# Patient Record
Sex: Female | Born: 1955 | Race: Black or African American | Hispanic: No | Smoking: Never smoker
Health system: Southern US, Community
[De-identification: ages and names within clinical notes are randomized; demographics above are authoritative.]

## PROBLEM LIST (undated history)

## (undated) DIAGNOSIS — E669 Obesity, unspecified: Secondary | ICD-10-CM

## (undated) DIAGNOSIS — M359 Systemic involvement of connective tissue, unspecified: Secondary | ICD-10-CM

## (undated) DIAGNOSIS — I2699 Other pulmonary embolism without acute cor pulmonale: Secondary | ICD-10-CM

## (undated) DIAGNOSIS — J0100 Acute maxillary sinusitis, unspecified: Secondary | ICD-10-CM

## (undated) DIAGNOSIS — J209 Acute bronchitis, unspecified: Secondary | ICD-10-CM

## (undated) DIAGNOSIS — E8881 Metabolic syndrome: Secondary | ICD-10-CM

## (undated) DIAGNOSIS — K559 Vascular disorder of intestine, unspecified: Secondary | ICD-10-CM

## (undated) DIAGNOSIS — Z8719 Personal history of other diseases of the digestive system: Secondary | ICD-10-CM

## (undated) DIAGNOSIS — J329 Chronic sinusitis, unspecified: Secondary | ICD-10-CM

## (undated) DIAGNOSIS — H6693 Otitis media, unspecified, bilateral: Secondary | ICD-10-CM

## (undated) DIAGNOSIS — I10 Essential (primary) hypertension: Secondary | ICD-10-CM

## (undated) DIAGNOSIS — Z8679 Personal history of other diseases of the circulatory system: Secondary | ICD-10-CM

## (undated) DIAGNOSIS — J45909 Unspecified asthma, uncomplicated: Secondary | ICD-10-CM

## (undated) DIAGNOSIS — E119 Type 2 diabetes mellitus without complications: Secondary | ICD-10-CM

## (undated) DIAGNOSIS — D219 Benign neoplasm of connective and other soft tissue, unspecified: Secondary | ICD-10-CM

## (undated) HISTORY — DX: Type 2 diabetes mellitus without complications: E11.9

## (undated) HISTORY — DX: Unspecified asthma, uncomplicated: J45.909

## (undated) HISTORY — PX: COLONOSCOPY W/ BIOPSIES: SHX1374

## (undated) HISTORY — DX: Acute maxillary sinusitis, unspecified: J01.00

## (undated) HISTORY — DX: Acute bronchitis, unspecified: J20.9

## (undated) HISTORY — DX: Essential (primary) hypertension: I10

## (undated) HISTORY — DX: Other pulmonary embolism without acute cor pulmonale: I26.99

## (undated) HISTORY — DX: Obesity, unspecified: E66.9

---
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
## 1997-11-19 ENCOUNTER — Ambulatory Visit (HOSPITAL_COMMUNITY): Admission: RE | Admit: 1997-11-19 | Discharge: 1997-11-19 | Payer: Self-pay | Admitting: Internal Medicine

## 1998-01-04 ENCOUNTER — Other Ambulatory Visit: Admission: RE | Admit: 1998-01-04 | Discharge: 1998-01-04 | Payer: Self-pay | Admitting: *Deleted

## 1998-01-28 ENCOUNTER — Other Ambulatory Visit: Admission: RE | Admit: 1998-01-28 | Discharge: 1998-01-28 | Payer: Self-pay | Admitting: *Deleted

## 1998-06-07 ENCOUNTER — Ambulatory Visit (HOSPITAL_COMMUNITY): Admission: RE | Admit: 1998-06-07 | Discharge: 1998-06-07 | Payer: Self-pay | Admitting: Internal Medicine

## 1998-08-13 HISTORY — PX: ABDOMINAL HYSTERECTOMY: SHX81

## 1998-10-06 ENCOUNTER — Inpatient Hospital Stay (HOSPITAL_COMMUNITY): Admission: RE | Admit: 1998-10-06 | Discharge: 1998-10-08 | Payer: Self-pay | Admitting: *Deleted

## 1998-12-30 ENCOUNTER — Other Ambulatory Visit: Admission: RE | Admit: 1998-12-30 | Discharge: 1998-12-30 | Payer: Self-pay | Admitting: *Deleted

## 1999-04-18 ENCOUNTER — Encounter: Payer: Self-pay | Admitting: Internal Medicine

## 1999-04-18 ENCOUNTER — Ambulatory Visit (HOSPITAL_COMMUNITY): Admission: RE | Admit: 1999-04-18 | Discharge: 1999-04-18 | Payer: Self-pay | Admitting: Internal Medicine

## 2000-08-10 ENCOUNTER — Encounter: Payer: Self-pay | Admitting: Internal Medicine

## 2000-08-10 ENCOUNTER — Ambulatory Visit (HOSPITAL_COMMUNITY): Admission: RE | Admit: 2000-08-10 | Discharge: 2000-08-10 | Payer: Self-pay | Admitting: Internal Medicine

## 2001-02-25 ENCOUNTER — Encounter: Payer: Self-pay | Admitting: Internal Medicine

## 2001-02-25 ENCOUNTER — Observation Stay (HOSPITAL_COMMUNITY): Admission: AD | Admit: 2001-02-25 | Discharge: 2001-02-27 | Payer: Self-pay | Admitting: Internal Medicine

## 2001-02-26 ENCOUNTER — Encounter: Payer: Self-pay | Admitting: Neurology

## 2001-09-22 ENCOUNTER — Inpatient Hospital Stay (HOSPITAL_COMMUNITY): Admission: AD | Admit: 2001-09-22 | Discharge: 2001-09-25 | Payer: Self-pay | Admitting: Internal Medicine

## 2001-09-23 ENCOUNTER — Encounter: Payer: Self-pay | Admitting: Internal Medicine

## 2002-05-15 ENCOUNTER — Encounter: Admission: RE | Admit: 2002-05-15 | Discharge: 2002-05-15 | Payer: Self-pay | Admitting: Internal Medicine

## 2002-05-15 ENCOUNTER — Encounter: Payer: Self-pay | Admitting: Internal Medicine

## 2002-12-18 ENCOUNTER — Encounter: Payer: Self-pay | Admitting: Cardiovascular Disease

## 2002-12-18 ENCOUNTER — Encounter: Admission: RE | Admit: 2002-12-18 | Discharge: 2002-12-18 | Payer: Self-pay | Admitting: Cardiovascular Disease

## 2003-05-26 ENCOUNTER — Other Ambulatory Visit: Admission: RE | Admit: 2003-05-26 | Discharge: 2003-05-26 | Payer: Self-pay | Admitting: *Deleted

## 2005-03-15 ENCOUNTER — Inpatient Hospital Stay (HOSPITAL_COMMUNITY): Admission: RE | Admit: 2005-03-15 | Discharge: 2005-03-19 | Payer: Self-pay | Admitting: Internal Medicine

## 2005-03-29 ENCOUNTER — Ambulatory Visit (HOSPITAL_COMMUNITY): Admission: RE | Admit: 2005-03-29 | Discharge: 2005-03-29 | Payer: Self-pay | Admitting: Gastroenterology

## 2005-09-25 HISTORY — PX: CARDIAC CATHETERIZATION: SHX172

## 2006-03-27 ENCOUNTER — Encounter: Admission: RE | Admit: 2006-03-27 | Discharge: 2006-03-27 | Payer: Self-pay | Admitting: Internal Medicine

## 2006-12-16 ENCOUNTER — Encounter: Admission: RE | Admit: 2006-12-16 | Discharge: 2006-12-16 | Payer: Self-pay | Admitting: Internal Medicine

## 2007-01-08 ENCOUNTER — Encounter: Admission: RE | Admit: 2007-01-08 | Discharge: 2007-01-08 | Payer: Self-pay | Admitting: Internal Medicine

## 2007-02-04 ENCOUNTER — Encounter: Admission: RE | Admit: 2007-02-04 | Discharge: 2007-02-04 | Payer: Self-pay | Admitting: Internal Medicine

## 2008-04-04 ENCOUNTER — Emergency Department (HOSPITAL_COMMUNITY): Admission: EM | Admit: 2008-04-04 | Discharge: 2008-04-04 | Payer: Self-pay | Admitting: Emergency Medicine

## 2008-04-09 ENCOUNTER — Ambulatory Visit (HOSPITAL_COMMUNITY): Admission: RE | Admit: 2008-04-09 | Discharge: 2008-04-09 | Payer: Self-pay | Admitting: Cardiology

## 2008-05-31 ENCOUNTER — Inpatient Hospital Stay (HOSPITAL_COMMUNITY): Admission: EM | Admit: 2008-05-31 | Discharge: 2008-06-04 | Payer: Self-pay | Admitting: Emergency Medicine

## 2008-06-01 ENCOUNTER — Encounter (INDEPENDENT_AMBULATORY_CARE_PROVIDER_SITE_OTHER): Payer: Self-pay | Admitting: Gastroenterology

## 2009-10-28 ENCOUNTER — Encounter: Admission: RE | Admit: 2009-10-28 | Discharge: 2009-10-28 | Payer: Self-pay | Admitting: Internal Medicine

## 2010-09-19 ENCOUNTER — Other Ambulatory Visit: Payer: Self-pay | Admitting: Internal Medicine

## 2010-09-19 DIAGNOSIS — R51 Headache: Secondary | ICD-10-CM

## 2010-09-19 DIAGNOSIS — R4789 Other speech disturbances: Secondary | ICD-10-CM

## 2010-09-19 DIAGNOSIS — R42 Dizziness and giddiness: Secondary | ICD-10-CM

## 2010-09-25 ENCOUNTER — Ambulatory Visit
Admission: RE | Admit: 2010-09-25 | Discharge: 2010-09-25 | Disposition: A | Discharge status: Home / Self Care | Payer: Self-pay | Source: Ambulatory Visit | Attending: Internal Medicine | Admitting: Internal Medicine

## 2010-09-25 DIAGNOSIS — R42 Dizziness and giddiness: Secondary | ICD-10-CM

## 2010-09-25 DIAGNOSIS — R51 Headache: Secondary | ICD-10-CM

## 2010-09-25 DIAGNOSIS — R4789 Other speech disturbances: Secondary | ICD-10-CM

## 2010-11-29 ENCOUNTER — Encounter: Payer: Self-pay | Admitting: Internal Medicine

## 2010-12-26 NOTE — Consult Note (Signed)
[name], [name]                ACCOUNT NO.:  [id number]   MEDICAL RECORD NO.:  [id number]          PATIENT TYPE:  INP   LOCATION:  6533                         FACILITY:  [facility]   PHYSICIAN:  Jordan Hawks. Elnoria Howard, MD    DATE OF BIRTH:  [birth year]   DATE OF CONSULTATION:  05/31/2008  DATE OF DISCHARGE:                                 CONSULTATION   REASON FOR CONSULTATION:  Abnormal CT scan and hematochezia.   REFERRING PHYSICIAN:  Eric L. August Saucer, MD   HISTORY OF PRESENT ILLNESS:  This is a 55 year old female with a past  medical history of pyelonephritis, hypertension, new onset diabetes, and  hysterectomy is admitted to the hospital with complaints of acute  abdominal pain and the patient started to have pain at 2 a.m. in the  morning and subsequently presented to the emergency room.  With the  pain, she had several bouts of diarrhea and then subsequently had  multiple bouts of hematochezia.  She denies having any issues with  abdominal pain or hematochezia in the past and because of her symptoms,  she was admitted to the hospital.  The pain was also associated with  nausea and vomiting.  A CT scan was obtained and it was revealing for a  thickening in the distal terminal ileum.  However, there was no  surrounding inflammation and the patient denies any recent sick  contacts, no fever, and no history of diarrhea before this time.  Previously, she was in her usual state of health before the acute onset  of her abdominal pain and hematochezia at 2 a.m.   PAST MEDICAL AND SURGICAL HISTORY:  As stated above.   FAMILY HISTORY:  Noncontributory.   SOCIAL HISTORY:  Positive for social alcohol, negative for alcohol or  illicit drug use.   ALLERGIES:  No known drug allergies.   MEDICATIONS:  Maxzide, metoprolol, and metformin.   REVIEW OF SYSTEMS:  As stated above in the history present of illness,  otherwise, negative.   PHYSICAL EXAMINATION:  VITAL SIGNS:  Stable.  GENERAL:  The  patient is in no acute distress, alert, and oriented.  HEENT:  Normocephalic and atraumatic.  Extraocular muscles are intact.  NECK:  Supple.  No lymphadenopathy.  LUNGS:  Clear to auscultation bilaterally.  CARDIOVASCULAR:  Regular rate and rhythm.  ABDOMEN:  Mildly obese, soft, and nontender in the midabdomen.  No  rebound or rigidity.  Positive bowel sounds.  EXTREMITIES:  No clubbing, cyanosis, or edema.  RECTAL:  Heme-positive.   LABORATORY VALUES:  White blood cell count 16.2, hemoglobin 15.3, MCV is  93.2, and platelets 408.  Sodium 138, potassium 2.8, chloride 102, BUN  is 14, creatinine 1.1, and glucose 157.   IMPRESSION:  1. Abnormal CT scan consistent with possible colitis.  2. Hematochezia.  3. Heme-positive stool.  After evaluation of the patient, she      certainly has a tender abdomen and further evaluation with an      endoscopic examination is required.  I believe a colonoscopy will      be in the best interest;  however, she is not able to tolerate any      types of p.o. currently, she had difficult time tolerating the oral      contrast for the CAT scan.   PLAN:  Plan at this time is to have the patient undergo a flexible  sigmoidoscopy with Fleet enema preparation and further recommendations  pending the findings.      Jordan Hawks Elnoria Howard, MD  Electronically Signed     PDH/MEDQ  D:  05/31/2008  T:  06/01/2008  Job:  [id number]   cc:   Minerva Areola L. August Saucer, M.D.

## 2010-12-26 NOTE — H&P (Signed)
NAMEMELONEY, [name]                ACCOUNT NO.:  [id number]   MEDICAL RECORD NO.:  [id number]          PATIENT TYPE:  INP   LOCATION:  6533                         FACILITY:  [facility]   PHYSICIAN:  Eric L. August Saucer, M.D.     DATE OF BIRTH:  1956/05/11   DATE OF ADMISSION:  05/31/2008  DATE OF DISCHARGE:                              HISTORY & PHYSICAL   CHIEF COMPLAINT:  New onset of left lower quadrant pain with lower GI  bleed.   HISTORY OF PRESENT ILLNESS:  This is a second [facility]  admission for this 55 year old widowed black female with a past history  of hypertension and nonspecific connective tissue disease.  The patient  states she had been doing well until yesterday evening.  She began  experiencing lower abdominal pain.  During the night, the pain became  much more severe in the left lower quadrant.  This was associated with  expulsive nausea, vomiting, and diarrhea at the same time.  She noted  bright red blood in her stools with her bouts of diarrhea.  There were  no clots noted.  She had several bouts of emesis without frank blood  being seen as well.  States she went approximately every 10 minutes for  2 hours.  She was significantly fatigued in the morning time and called  office at that time for the evaluation.  In view of the rectal bleeding,  she was advised to come to the emergency room for further evaluation.  Note that the history is negative for any change in eating habits.  The  patient does not drink.  She had been using aspirin at 81 mg daily for  the past month.  She had also taken one Aleve 1 day prior to this event  for right shoulder pain.   PAST MEDICAL HISTORY:  Otherwise, significant for having presented to  the emergency room in August of this year with left-sided chest pain.  There was no abdominal pain at that time.  She subsequently underwent  further evaluation with Dr. Sharyn Lull for Cardiology.  No significant  coronary artery disease was  found.  She has, however, been maintained on  low-dose aspirin.   Her history is significant for problems with irritable bowel syndrome in  the past.  She has had a recurrent bouts of sinusitis and allergic  rhinitis.  Atypical headaches in the past.  She has had colonic  dysfunction with constipation.  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   History is significant as noted for hypertension as well.   PAST SURGICAL HISTORY:  Notable for hysterectomy.   FAMILY HISTORY:  Positive for hypertension and diabetes.   SOCIAL HISTORY:  The patient is widowed, has one teenage daughter, alive  and well.   MOST RECENT MEDICATIONS:  Consist of:  1. Metformin 500 mg p.o. daily.  2. Metoprolol 50 mg b.i.d.  3. Maxzide 25 mg daily.  4. Cyclobenzaprine 5 mg t.i.d., muscle spasms.  5. Biotin daily.  6. Gabapentin 300  mg t.i.d.  7. Calcium plus D 2 tablets daily.   PHYSICAL EXAMINATION:  GENERAL:  She is an ill-appearing black female  presently in mild distress.  VITAL SIGNS:  Blood pressure 130/90, pulse 88, and respiratory rate 18.  Afebrile.  HEENT:  Head is normocephalic and atraumatic without bruits.  Extraocular muscles intact.  Fundi grade 0.  There is no sinus  tenderness.  TMs with cerumen bilaterally.  Nose:  Mild turbinate edema  without occlusions.  Fairly good dental repair.  Posterior oropharynx  clear.  NECK:  Supple.  No enlarged thyroid.  Small posterior cervical nodes on  the left versus right.  LUNGS:  Clear to auscultation.  No wheezes or rales.  No E to A changes  appreciated.  Decreased breath sounds at the bases.  CARDIOVASCULAR:  Normal S1 and S2.  No S3, S4, murmurs, or rubs.  ABDOMEN:  Bowel sounds present and active.  He does have left lower  quadrant tenderness with early rebound noted.  No right upper quadrant  tenderness, left or right lower quadrant tenderness.  MUSCULOSKELETAL:   Full passive range of motion of extremities.  No  cyanosis or clubbing.  No crepitations or edema appreciated.  Negative  Homans.  NEUROLOGIC:  Grossly intact at this time.  SKIN:  Without active lesions.   LABORATORY DATA:  CBC revealed WBC of 16,200, hemoglobin 15.3,  hematocrit of 45.0, RDW of 13.2, platelet counts of 40,000, and 93%  neutrophils, which represents a left shift.  Glucose of 157, sodium 138,  potassium 3.8, chloride 102, BUN 14, and creatinine 1.1.  Laboratory  data pending.  CT scan of the abdomen shows the lung base with dependent atelectasis  bilaterally.  Heart size is normal.  Liver, gallbladder, and adrenal  glands were unremarkable.  CT scan of the pelvis demonstrated  questionable thickening of the distal transverse colon, which may be  infectious or inflammatory in etiology.  There was no pathologically  enlarged lymph nodes.  No lytic lesions seen as well.   IMPRESSION:  1. New onset left lower quadrant abdominal pain.  Rule out ischemic      colitis.  Rule out diverticulitis.  Rule out other.  2. Lower gastrointestinal bleed secondary to new onset of left lower      quadrant abdominal pain.  3. 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  4. Hypertension.  5. Insulin-resistance/early diabetes mellitus.  6. History of atypical chest pain, on aspirin therapy.  7. History of recurrent musculoskeletal pain, nonspecific.   PLAN:  The patient was admitted at this time to [facility] for close  observation and further evaluation.  She will need serial hemoglobins  and hematocrits.  We will control her pain acutely with low-dose  Dilaudid as needed.  We will place on Cipro IV empirically as well.  GI  consultation has been obtained with Dr. Elnoria Howard.  She will need more direct  evaluation over the next 24 hours.  Further therapy pending results of  the above.           ______________________________  Lind Guest. August Saucer, M.D.     ELD/MEDQ  D:   05/31/2008  T:  06/01/2008  Job:  [id number]

## 2010-12-29 NOTE — Consult Note (Signed)
[place]. [facility]  Patient:    Claudia, Anderson                        MRN: [id number] Proc. Date: 02/26/01 Adm. Date:  [id number] Attending:  Gwenyth Bender CC:         Lind Guest. August Saucer, M.D.  [facility], [address]   Consultation Report  BRIEF HISTORY:  This is a 55 year old black female, right-handed, born 08-Feb-1956, with a history of hypertension who comes to [facility] for evaluation of left arm numbness and pain.  The patient claims that the problem began suddenly overnight in early June of 2002.  The patient had noted some numbness and minimal pain below the knee on the left leg beginning about 1 year ago that has been persistent.  The patient believed that the left arm has also been somewhat weak, and denies any problems with bowel or bladder control and denies any balance problems.  The patient does note some pain in the base of the neck but this is not severe.  The patient denies any numbness or weakness on the face or head, denies any visual field changes, double vision, or loss of vision.  The patient has been brought in for further evaluation and has had blood work done with borderline studies for lupus and mixed connective tissue disease.  MRI scan done with gadolinium only is unremarkable by my review.  MRI is to be repeated without gadolinium.  Neurology was asked to see this patient for further evaluation.  PAST MEDICAL HISTORY:  Significant for: 1. History of hysterectomy for fibroids. 2. Hypertension. 3. History of asthma. 4. Left arm, left leg numbness with pain in the left arm.  MEDICATION:  The patient prior to admission was on: 1. Toprol XL 50 mg daily. 2. Maxzide 25 mg 3. Singulair 10 mg a day. 4. Advair inhaler. 5. Allegra 180 mg daily. 6. Cyclobenzaprine 10 mg twice a day. 7. Celebrex 200 mg twice a day.  ALLERGIES:  The patient has no known allergies.  HABITS:  Does not  smoke or drink.  SOCIAL HISTORY:  The patient is an Neurosurgeon. The patient lives in [place], and has a daughter age 42.  FAMILY HISTORY:  Notable that mother, age 70, with hypertension and diabetes. Father age 54 with hypertension.  There is a history of stroke in several aunts and uncles.  REVIEW OF SYSTEMS:  Notable for no recent fevers, chills.  The patient denies headache or visual field changes.   Neck:  Neck stiffness.  Does note some pain in the base of the neck.  Does note some left arm pain with tingly sensations that go up the entirety of the arm to the shoulder.  Does note some numbness below the knee on the left leg.  Denies any abdominal pain, shortness of breath, chest pains, blackout episodes, dizziness.  PHYSICAL EXAMINATION:  VITAL SIGNS:  Blood pressure is 110/70, heart rate is 108, respiratory rate 20, temperature 99.7 on admission.  GENERAL:  This patient is a minimally obese black female who is alert and cooperative at the time of examination.  HEENT:  Head is atraumatic.  Eyes:  Pupils, equal, round, reactive to light. Disks are flat bilaterally.  NECK:  Supple.  No carotid bruits.  RESPIRATORY EXAMINATION:  Clear to auscultation and percussion.  CARDIOVASCULAR:  Reveals regular rate and rhythm no obvious murmurs, rubs  noted.  EXTREMITIES:  Without significant edema.  NEUROLOGIC:  Cranial nerve as above.  Facial symmetry was present.  The patient has good sensation to the face to pinprick and soft touch bilaterally. The patient had good strength to facial muscles and the muscles of head turning and shoulder shrug bilaterally.  The muscles of mastication are symmetric and normal in strength.  The patient has good tongue strength bilaterally.  Speech is well enunciated and not aphasic.  Motor testing was 5/5 strength in all fours.  Good symmetric motor tone is noted throughout. Sensory testing is notable for some  decreased pinprick sensation on the entirety of the left arm, base of the left neck, left leg above and below the knee, vibratory sensation is minimally depressed on the left arm as compared to the right and symmetric in the lower extremities.  The patient has good position sense in all fours.  No evidence of ______ is noted.  The patient has good finger-nose-finger, heel to chin bilaterally.  Gait is normal, tandem gait normal, Romberg negative, no evidence of pronator drift is seen.  Deep tendon reflexes are symmetric and normal with toes downgoing.  LABORATORY DATA:  Notable for a CPK of 131, sodium 138, potassium 4.2, chloride of 104, CO2 20, glucose 94, BUN 14, creatinine 0.9, calcium 9.9, total protein 7.8, albumin 4.1, AST of 24, ALT 18, alk. phos. 56, total bilirubin 0.7, white count 7.1, hemoglobin 14.3, hematocrit 41.7, MCV 92.4, platelets 348, ANA is pending.  C-reactive protein pending.  MRI scan of the head is pending at this time.  Initial study with gadolinium is unremarkable by my reading.  IMPRESSION:  Central alteration of pain left arm, chronic left lower extremity numbness.  This patient has a relatively unremarkable examination.  The patient notes with neck extension that she does have some increased tingly sensations down the left arm.  Certainly demyelinating disease such as multiple sclerosis does need to be considered but also need to rule out a cervical disk or bone spur compressing the right side of the spinal cord, may offer some left-sided sensory complaints.  Will proceed with further work-up at this point.  PLAN: 1. MRI scan of the cervical spine. 2. Lumbar puncture if MRIs are unremarkable. 3. ACE level for sarcoidosis if this has not already been done. 4. Will follow patient clinically while in house. DD:  02/26/01 TD:  02/26/01 Job: [id number]

## 2010-12-29 NOTE — Discharge Summary (Signed)
[place]. [facility]  Patient:    [name], [name] Visit Number: [id number] MRN: [id number]          Service Type: Attending:  Lind Guest. August Saucer, M.D. Dictated by:   Lind Guest. August Saucer, M.D. Adm. Date:  09/22/01 Disc. Date: 09/25/01                             Discharge Summary  FINAL DIAGNOSES: 1. Chest pain, noncardiac; 786.59. 2. Hypertension, 401.9. 3. Stress reaction 308.0. 4. Allergic rhinitis, 477.9. 5. Chronic sinusitis, 473.9. 6. Otitis media, 382.9. 7. 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 8. Bronchitis.  OPERATION/PROCEDURE:  Cardiac catheterization per Ricki Rodriguez, M.D.  HISTORY OF PRESENT ILLNESS:  This was the second [place] admission for this 55 year old divorced black female who presented to the office complaining of increasing chest pain of one weeks duration.  She had described the pain as being dull, substernal pressure sensation which has increased with activity.  There was no associated palpitations, shortness of breath or diaphoresis.  No associated nausea as well.  The patient tried antacids without significant improvement.  She noted that the pain would not awaken her from her sleep.  She notably had been under increased stress over the past several months.  The day of admission her pains had become increasingly worse, associated with increasing level of anxiety as well.  The patient was seen in the office for evaluation and was noted to have a borderline EKG.  The patient was subsequently admitted for further evaluation for possible coronary artery disease.  The past medical history and history and physical exam is per admission H&P.  HOSPITAL COURSE:  The patient was admitted for further treatment of chest pain of questionable etiology.  She was noted to have some ST segment depressions in the inferior leads at the time of admission. She was placed on telemetry. Cardiac enzymes were obtained q.8h. x3.   She was also given morphine sulfate for control of her pain acutely.  IV nitroglycerin drip was started as well. The patient was seen in consultation per Dr. Algie Coffer.  Over the subsequent 24 hours, the patients enzymes were negative.  She subsequently did undergo a stress Cardiolite study which was abnormal from the standpoint of showing suggestions of anterior ischemia and possible apical area as well.  After reviewing this with the patient and discussing her chest pains it was elected to proceed with cardiac catheterization.  Notably, at the time of admission, the patient did have a low potassium as well.  This was corrected prior to her undergoing cardiac catheterization.  On September 25, 2001, she underwent cath per Dr. Algie Coffer.  Her coronaries were essentially normal.  She had normal left ventricular function as well.  The possibility of vasospasm could not, however, be excluded.  While hospitalized, the patient was noted to have a borderline elevation of her blood sugars as well.  Her hemoglobin A1C was obtained which was 6, which was well within normal limits.  She was also treated for her sinuses with clinical evidence with sinusitis as well.  By September 25, 2001, she was feeling considerably better.  Her anxieties were also addressed and it was doing better at that time.  Her chest pain had improved considerably. She was subsequently felt to be stable for discharge.  It was felt that a GI evaluation could be pursued as an outpatient if necessary as well.  MEDICATIONS AT  DISCHARGE: 1. Buspar 15 mg half tablet b.i.d. 2. Allegra 180 mg q.d. 3. Norvasc 2.5 mg q.d. 4. Maxzide 12.5 mg q.d. 5. Toprol XL 25 mg p.o. q.d. 6. Levaquin 500 mg q.d. x7 days. 7. Vitamin E units b.i.d. 8. Darvocet-N 100 one to two p.o. q.4h. p.r.n. pain. 9. One coated baby aspirin 81 mg q.d.  DIET:  She is to be maintained on a no concentrated sweets diet.  DISCHARGE INSTRUCTIONS:  She was encouraged  to start saline nasal irrigations twice a day for sinuses with saline gargles as needed.  Should she have any problems with the cath site, she will contact this office or Dr. Algie Coffer.  FOLLOW-UP:  She will, otherwise, be scheduled to be seen in the office in two weeks. Dictated by:   Lind Guest. August Saucer, M.D. Attending:  Lind Guest. August Saucer, M.D. DD:  11/19/01 TD:  11/20/01 Job: [id number]

## 2010-12-29 NOTE — H&P (Signed)
[place]. [facility]  Patient:    [name], [name]                        MRN: [id number] Adm. Date:  [id number] Attending:  Gwenyth Bender                         History and Physical  CHIEF COMPLAINT:  Persistent left upper extremity weakness.  HISTORY OF PRESENT ILLNESS:  First recent [facility] admission for this 55 year old single black female with longstanding history of hypertension and nonspecific connective tissue disease was admitted for further evaluation of persistent left upper extremity numbness and weakness.  Patient had been feeling well up until approximately one month ago when she had awakened with a feeling of heaviness in the left arm.  There was no significant pain with that sensation.  Denied any neck pain or shoulder pain.  Over the subsequent days, her symptoms persisted.  When she was seen in the office, she was noted to have mild tenderness in the left trapezius muscle.  There was minimal discomfort in the [facility] joint.  There was no significant tenderness in the axillary region at that time.  Patient denied any associated trauma.  She had no significant neck pain otherwise.  She was given an empiric trial of an anti-inflammatory agent - Celebrex - at that time.  She was also given a muscle relaxant.  She noted only transient improvement; however, the heaviness persisted.  Patient also noted intermittent tingling in her left leg; noted definite weakness as well.  She denied any significant headaches, blurred vision, or nausea.  Over the subsequent week, her symptoms have persisted. She notably also complained of intermittent fullness of the left upper extremity as well.  There was no increased warmth.  There have been no documented vascular changes on direct exam.  No cord lesions appreciated.  Due to the unclear nature of patients symptoms, arrangements were made for patient to undergo a comprehensive evaluation for the underlying  etiology. Patient with no prior symptoms of such.  She notes now that her sensations in the left arm are such that she has difficulty holding objects.  She does no specific activity with her left upper extremity on a regular basis.  She denies any distant history of trauma.  No excessive neck pain or popping in the past with rotation.  No overhead lifting.  Patient works for the [employer].  States she spends 50% of her time sitting versus walking around as well.  PAST MEDICAL HISTORY:  Remarkable for longstanding hypertension - which has been under control.  She has had a positive ANA in the past with negative double-stranded DNA.  She has had a positive ENA in the past.  She has been seen by Dr. Phylliss Bob of rheumatology with no specific diagnosis other than nonspecific connective tissue disease.  Patient has history of significant allergic rhinitis with associated sinusitis.  She has been followed by an allergist as well as ENT.  Consideration has been given for sinus surgery as well.  Past medical history otherwise remarkable for hysterectomy in 2000 for uterine fibroids.  DRUG ALLERGIES:  No known allergies.  PRESENT MEDICATIONS: 1. Maxzide 25 mg p.o. q.d. 2. cyclobenzaprine 10 mg b.i.d. 3. Singulair 10 mg p.o. q.d. 4. Allegra 180 mg p.o. q.d. 5. Celebrex presently at 200 mg b.i.d. 6. Toprol-XL 50 mg one-half tablet q.d.  OTHER  ALLERGIES:  Patient does suffer from allergic rhinitis and is allergic to mold, trees, and grasses of different types.  SOCIAL HISTORY:  Patient is single, has one daughter age 46.  She does not smoke or drink.  FAMILY HISTORY:  Positive for hypertension and diabetes mellitus.  No known cholesterol problems.  REVIEW OF SYSTEMS:  As noted above.  She has had intermittent problems with lower back discomforts.  She describes this as a burning sensation.  This had not radiated into her lower legs.  No significant leg cramps.   Occasional problems with constipation.  No nausea or vomiting.  Denies any new rashes, though she had had a nonspecific erythematous rash several months ago; this resolve spontaneously.  PHYSICAL EXAMINATION:  GENERAL:  She is a well-developed, well-nourished, overweight black female in no acute distress.  VITAL SIGNS:  Height 5 feet 3 inches, weight 172 pounds.  Blood pressure 138/90; pulse 108, on repeat 88; respiratory rate 20; temperature 99.7, on repeat 97.9.  HEENT:  Head normocephalic, atraumatic, without bruits.  Extraocular muscles are intact without nystagmus.  Pupils equal and reactive to light and accommodation.  Fundi grade 1.  Presently there is no sinus tenderness.  She has bilateral turbinate edema.  NECK:  There is no enlarged thyroid.  She does have tenderness to palpation in the left trapezius muscle to direct palpation.  Notably, this does not cause exacerbation of her left arm symptoms on direct palpation.  The right trapezius muscles are intact without tenderness.  LUNGS:  Clear without wheezes or rales.  No E to A changes.  No CVA tenderness.  CARDIOVASCULAR:  Shows a normal S1, S2.  No S3.  No murmur or rubs appreciated.  BREASTS:  Without discrete masses.  No axillary nodes.  ABDOMEN:  Bowel sounds present.  No enlarged liver or spleen, or masses or tenderness.  No suprapubic tenderness.  MUSCULOSKELETAL:  Left trapezius muscle as noted.  She does have pain in the left [facility] joint to direct palpation.  There is also pain with abduction at 80 degrees.  Mild capsular tenderness.  No direct crepitation appreciated.  There is mild tenderness in the left deltoid tendon to direct palpation.  The right AC joint and associated muscle groups are intact.  Minimal tenderness to the lower lumbosacral spine with mild paraspinal muscle prominence - left greater than right.  She has negative straight leg raise bilaterally.  Negative Homans.  No edema.  Pulses are  intact.  NEUROLOGIC:  She is alert, oriented x 3.  Cranial nerves are intact.  She has 3-4+ DTRs in the left biceps versus the right.  Decreased sensation in the  left hand versus right.  Grasp is 3/4 in the left versus 5/5 in the right. The lower extremity is notable for 4/5 left dorsiflexion versus 5/5 on the right.  She has 1-2 beats of mild clonus bilaterally.  Absent Babinskis bilaterally.  No frontal release signs appreciated.  LABORATORY DATA:  CBC reveals WBC 7100, hemoglobin 14.3, hematocrit 41.7, normal indices.  CK 131.  CMET reveals sodium 138, potassium 4.2, chloride 104, CO2 28, BUN 14, creatinine 0.9, albumin 4.1, calcium 9.8, total protein 7.8.  SGOT/SGPT 24 and 18 respectively, alkaline phosphatase 56, total bilirubin 0.7.  C reactive protein 0.4.  ANA pending.  IMPRESSION: 1. Persistent left upper extremity weakness of questionable etiology.  Rule    out atypical shoulder-hand syndrome.  Rule out cervical disk disease.  Rule    out multiple sclerosis.  Rule  out other source of CNS disease, i.e. CVA,    albeit atypical in presentation. 2. Left shoulder pain.  Rule out impingement syndrome.  No clear history of    injury or previous degenerative disease by description. 3. Left lower extremity weakness, mild.  Rule out relationship to the above    versus localized lumbar disk disease. 4. History of nonspecific connective tissue disease.  Question of relationship    to above-noted symptoms. 5. Hypertension. 6. Allergic rhinitis with history of sinusitis.  Rule out occult CNS infection    secondary to the above, as well.  Patient notably has no other risk    factors.  She does not smoke or drink.  PLAN:  She is admitted for further evaluation.  MRI scan of the head and neck and shoulder well be obtained.  Neurologic evaluation thereafter.  Will also have orthopedic evaluate her pending the results of the MRI scan of the shoulder to exclude impingement syndrome.   Continue her other medications at this time.  Further therapy pending results of the above.  Physical therapy thereafter if no reversible or obvious explanation of above symptoms. DD:  02/26/01 TD:  02/26/01 Job: [id number]

## 2010-12-29 NOTE — Cardiovascular Report (Signed)
[place]. [facility]  Patient:    [name], [name] Visit Number: [id number] MRN: [id number]          Service Type: MED Location: [phone] Attending Physician:  Gwenyth Bender Dictated by:   Ricki Rodriguez, M.D. Proc. Date: 09/25/01 Admit Date:  09/22/2001   CC:         Eric L. August Saucer, M.D.   Cardiac Catheterization  PROCEDURE:  Left heart catheterization, selective coronary angiography, left ventricular function study.  CARDIOLOGIST:  Ricki Rodriguez, M.D.  INDICATION:  This 55 year old black female with a new onset of chest pain had nuclear stress test showing reversible ischemia.  APPROACH:  Right femoral artery using 6-French diagnostic catheters.  COMPLICATIONS:  None.  HEMODYNAMIC DATA:  The left ventricular pressure was 122/6 mmHg, and the aortic pressure was 122/76 mmHg.  LEFT VENTRICULOGRAM:  The left ventriculogram showed normal left ventricular systolic function with ejection fraction of 65%.  CORONARY ANATOMY: Left main coronary artery:  The left main coronary artery was unremarkable.  Left anterior descending coronary artery:  The left anterior descending coronary artery was also unremarkable. Left circumflex coronary artery:  The left circumflex coronary artery was also unremarkable.  Right coronary artery:  The right coronary artery was dominant and unremarkable.  IMPRESSION: 1. Normal left ventricular function. 2. Normal coronaries.  RECOMMENDATIONS:  This patient will have a noncardiac chest pain evaluation. Dictated by:   Ricki Rodriguez, M.D. Attending Physician:  Gwenyth Bender DD:  09/25/01 TD:  09/25/01 Job: [id number]

## 2010-12-29 NOTE — H&P (Signed)
[place]. [facility]  Patient:    [name], [name] Visit Number: [id number] MRN: [id number]          Service Type: MED Location: [phone] Attending Physician:  Gwenyth Bender Dictated by:   Lind Guest. August Saucer, M.D. Admit Date:  09/22/2001                           History and Physical  CHIEF COMPLAINT:  New onset of chest pain.  HISTORY OF PRESENT ILLNESS:  This is the second [facility] admission for this 55 year old divorced black female who presented to the office complaining of increasing chest pain of one weeks duration.  She describes the pain as being a dull, substernal pressure sensation which has increased with activity.  There is no associated palpitations, shortness of breath, or diaphoresis.  There have been no associated nausea with this as well.  She noted only minimal improvement with the taking of Tums.  The pain would not awaken her from a sleep.  She has also during this time has had increasing headaches which have been more on her right side in nature, dull and throbbing in nature.  This has been associated with some cough as well.  Notably her chest symptoms are not worsened by deep inspiration.  She has notably been under increased stress over the past few months.  She notes that her symptoms have progressed with her anxiety levels as well.  She was seen in the office today and was noted to have a borderline abnormal EKG. The patient is admitted at this time for further evaluation.  PAST MEDICAL HISTORY:  History is remarkable for hypertension.  She does suffer from allergic rhinitis as well asthma.  She has had a borderline mixed connective tissue disease picture with a positive ANA speckled pattern in the past.  Sedimentation rates, however, have been normal.  Past history is remarkable for admission to [facility] approximately six months ago for evaluation of atypical neck and arm symptoms which  have gradually subsided.  Past history remarkable for hypertension for the past four years.  The patient does not smoke or drink.  FAMILY HISTORY:  Positive for diabetes mellitus, hypertension.  No history of coronary artery disease in the family.  SOCIAL HISTORY:  The patient is divorced.  She has one daughter, age 53.  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  REVIEW OF SYSTEMS:  Otherwise unremarkable.  ALLERGIES:  She has no known allergies.  MEDICATIONS: 1. Toprol XL 50 mg 1/2 tablet q.d. 2. Maxzide 25 mg q.d. 3. Advair 100/51 with 1 puff b.i.d. 4. Ambien 5 mg p.o. q.h.s. 5. Allegra 180 mg p.o. q.d. on a p.r.n. basis.  PHYSICAL EXAMINATION:  GENERAL:  She is a well-developed, well-nourished black female presently in no acute distress.  VITAL SIGNS:  Height 5 feet 3 inches.  Weight 160 pounds.  Blood pressure 140/82.  A repeat was 130/88.  Pulse of 82, respiratory rate 18, temperature 98.3.  HEENT:  Head normocephalic and atraumatic without bruits.  Extraocular muscles are intact.  She had left frontal sinus tenderness versus the right.  She has mild left maxillary sinus tenderness.  Left TM is reddened.  Right TM is clear.  Nose shows left turbinate edema versus right.  Throat shows posterior pharyngeal erythema.  Prominent tonsils bilaterally.  NECK:  Supple.  Small  cervical nodes in the left versus right.  No subnodal nodes.  LUNGS:  Clear without wheezes or rales.  No E to A changes appreciated.  CARDIOVASCULAR:  Normal S1 and S2.  No S3, S4, murmurs, or rubs.  CHEST:  Compression of the chest wall does not elicit pain.  ABDOMEN:  Bowel sounds are present.  There is no epigastric tenderness to deep palpation as well.  MUSCULOSKELETAL:  Full passive range of motion without cyanosis or clubbing. Minimal crepitus of the knees.  Negative Homans.  No edema.  NEUROLOGICAL:  Intact.  LABORATORY DATA:   EKG in office showed borderline ST segment depressions in the inferior leads.  Normal axis.  EKG for hospital is pending.  CMET, CBC, and CK-MB is pending as well.  Chest x-ray is pending.  IMPRESSION: 1. Chest pain, new onset in nature:  Rule out angina secondary to underlying    coronary artery disease. 2. Anxiety disorder associated with situational stress. 3. Hypertension. 4. Allergic rhinitis. 5. Sinusitis with otitis media. 6. Borderline nonspecific connective tissue disease.  PLAN:  Admit to the hospital at this time for further evaluation.  Rule MI. We will place on nitroglycerin drip empirically at this time with Morphine sulfate p.r.n. chest pain.  Cardiology consultation has been obtained with Dr. Ricki Rodriguez for further evaluation.  Further therapy thereafter.  Dictated y:   Minerva Areola L. August Saucer, M.D. Attending Physician:  Gwenyth Bender DD:  09/22/01 TD:  09/22/01 Job: [id number]

## 2010-12-29 NOTE — Discharge Summary (Signed)
[name], [name]                 ACCOUNT NO.:  [id number]   MEDICAL RECORD NO.:  [id number]          PATIENT TYPE:  INP   LOCATION:  5511                         FACILITY:  [facility]   PHYSICIAN:  Eric L. August Saucer, M.D.     DATE OF BIRTH:  03/18/1956   DATE OF ADMISSION:  03/15/2005  DATE OF DISCHARGE:  03/19/2005                                 DISCHARGE SUMMARY   FINAL DIAGNOSES:  1.  Pyelonephritis involving the right kidney.  2.  Abdominal pain secondary to #1.  3.  Viral gastroenteritis resolving.  4.  Irritable bowel syndrome.  5.  Hypokalemia secondary to diuretics.  6.  Sinusitis.  7.  Atypical headaches secondary to #6.  8.  Colonic dysfunction with constipation.   OPERATION/PROCEDURE:  None.   HISTORY OF PRESENT ILLNESS:  This is the first recent [facility]  admission for this 55 year old black female who presented to the office  complaining of increasing right flank pain.  She first noted a vague aching,  right-sided pain approximately two weeks ago.  There was no associated  dietary changes.  She did have intermittent nausea and vomiting during that  time.  This was accompanied by a watery diarrhea without blood or mucusy  stools.  One day prior to admission, she noted a burning on voiding without  increased odor.  On the day of admission, she developed right flank and  right lower quadrant sharp, aching pain.  She rated the pain a 7 out of a  10.  The patient tried an antispasmodic agent without significant relief.  The patient was subsequently seen in our office acutely.  A CT scan of the  abdomen was done to rule out diverticulitis versus other.  This revealed  changes in the right kidney consistent with a pyelonephritis.  The patient  subsequently admitted for further evaluation.   PAST MEDICAL HISTORY:  1.  Hypertension.  2.  Irritable bowel syndrome.  3.  Distant history of myositis.   PAST SURGICAL HISTORY:  Hysterectomy.   FAMILY HISTORY:  Positive  for hypertension and diabetes.   MEDICATIONS ON ADMISSION:  1.  Toprol-XL 50 mg half tablet b.i.d.  2.  Maxzide 25 mg daily.  3.  Flexeril 5 mg t.i.d.  4.  __________ subcutaneously p.r.n. spasm.  5.  Ambien 5 mg p.o. at night p.r.n. sleep.   HOSPITAL COURSE:  The patient was admitted for further evaluation of  atypical abdominal pain.  Notably at the time of admission, she had  potassium 3.1. White count 8700, hemoglobin 14, hematocrit 40.8. The patient  underwent urine cultures. She was initially placed at bowel rest.  She was  started on IV fluids for hydration as well with a collection electrolytes.  In view of her pain, she was given IV Dilaudid q.3h. with Phenergan.  She  was placed on Rocephin 1 g IV daily.  Over the subsequent 24 hours, she made  gradual progress.  Urine culture did return positive for a staph species.  Coagulase negative.  She was continued on IV Rocephin at that time.  Appetite remained fair.  She notably did not spike a fever.  With continued  treatment, she continued to have right-sided pain.  She was subsequently  seen by Dr. Evette Cristal of gastroenterology.  It was felt that most of her  symptoms were secondary to renal changes as seen on CT scan.  It was,  however, felt that she may have experienced a bowel gastroenteritis which  caused initial bouts of watery stools and nausea and vomiting.   The patient was continued on antibiotics with antispasmodic agents.  She  made a gradual progress thereafter over the subsequent days.   She did complain of increasing headaches, more on the right side versus the  left.  She had an episode of dizziness as well.  Exam was most consistent  with early otitis media.  A CT scan of the head was done as of August 7,  which confirmed changes consistent with sinusitis.  No abnormal masses  appreciated.  This was treated with meclizine for dizziness.  The patient  was also given antihistamine as well.   As noted above, the  patient's urine culture was positive for staph which was  resistant to Levaquin.  She was started on doxycycline which she did  tolerate.  On the day of discharge she had complained of constipation.  This  was causing some increase in right side and back pain as well.  She was  given magnesium citrate with good results.  Symptoms improved considerably.  By March 19, 2005 in the afternoon, she was felt to be stable enough for  discharge home.  The patient was subsequently discharged home improved.  She  remained afebrile.  White count was normal.   DISCHARGE MEDICATIONS:  1.  Doxycycline 100 mg b.i.d.  2.  Levsinex 375 mg q.12h.  3.  Toprol-XL 25 mg half a tablet b.i.d.  4.  Claritin 10 mg daily.  5.  Multivitamin one daily.  6.  Colace 100 mg b.i.d.  7.  Antivert 12.5 mg p.r.n. dizziness.  8.  She will be maintained on Darvocet-N 100 one to two p.o. q.4h. as needed      for pain.   DISCHARGE INSTRUCTIONS:  She may return to work at an anticipated date of  March 22, 2005.  She is to follow up in our office in 10 days.  She would  also need a repeat CT scan to document clearing of her renal abnormality.  Will need to recheck her CRP which was elevated at 3.4.       ELD/MEDQ  D:  03/19/2005  T:  03/20/2005  Job:  [id number]

## 2010-12-29 NOTE — Discharge Summary (Signed)
[name], [name]                ACCOUNT NO.:  [id number]   MEDICAL RECORD NO.:  [id number]          PATIENT TYPE:  INP   LOCATION:  5159                         FACILITY:  [facility]   PHYSICIAN:  Eric L. August Saucer, M.D.     DATE OF BIRTH:  [birth year]   DATE OF ADMISSION:  05/31/2008  DATE OF DISCHARGE:  06/04/2008                               DISCHARGE SUMMARY   FINAL DIAGNOSES:  1. Ischemic colitis, 557.9.  2. Blood in stools, 578.1.  3. Hypertension, 401.9.  4. Dysmetabolic syndrome X, 277.7.  5. Arteritis, 447.6  6. Hemorrhoids, 455.6.  7. 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   OPERATIONS AND PROCEDURES:  Colonoscopy per Dr. Jeani Hawking with  biopsy.   HISTORY OF PRESENT ILLNESS:  This was the second [facility]  admission for this 55 year old widowed black female with a past history  of hypertension and nonspecific connective tissue disease.  The patient  states she had been doing well until 1 day prior to admission.  She at  that time, began experiencing left lower quadrant abdominal pain.  During the night, pain became much more severe.  This was associated  with explosive nausea, vomiting, and diarrhea at the same time.  The  patient noted the onset of bright red blood in her stools with bouts of  diarrhea.  There were no clots noted.  The patient had several bouts of  emesis without frank blood being seen as well.  States she was going at  most every 10 minutes for approximately 2 hours time.  She was  significantly fatigued in the following morning and called the office  for further evaluation.  In view of the rectal bleeding, she was advised  to come to emergency room for further evaluation.   The patient notably had not noted any change in eating habits.  She does  not drink.  She had been using aspirin 81 mg daily for the past month.  She also taken 1 Aleve 1 day prior to this event due to right shoulder  pain.   PAST MEDICAL HISTORY AND PHYSICAL EXAM:  As per  admission H&P.   HOSPITAL COURSE:  The patient was admitted for further evaluation of new  onset of left lower quadrant abdominal pain with rectal bleeding.  She  was confirmed to be guaiac positive on rectal exam.  It was felt that  this was possibly secondary to a colitis, either diverticulitis or  ischemic colitis.  At the time of presentation, her hemoglobin was 15.3  and her vital signs are stable.  The patient was admitted initially to  the step-down unit for close observation.  She underwent serial H and  H's q.12 h.  She underwent gentle rehydration as well.  The patient was  seen in consultation by Dr. Jeani Hawking of Gastroenterology.  The  patient was maintained initially on n.p.o. status with bowel rest.  She  was maintained on IV fluids.  She was given IV Dilaudid with Zofran for  considerable abdominal pain over the first 24 hours.  This regimen did  require adjustment as  well.  Serial H and H's, it was noted hemoglobin  was 11.9 after initial rehydration.  She on the subsequent day,  underwent a flexible sigmoidoscopy per Dr. Elnoria Howard.  He saw changes in her  colon which was suggestive of ischemic colitis versus Crohn disease.  She also had medium hemorrhoids.  Biopsies were done of this area.   The subsequent day, she continued to have abdominal pain, but this was  gradually decreasing.  She was noted to have problems with hypokalemia  as well possibly secondary to the excessive diuresis.  She was  replenished with oral potassium as tolerated.  She had previously been  on a diuretic and this was held as well.   The pathology subsequently returned, highly suggestive of ischemic  colitis picture.  There was no direct evidence to suggest Crohn's.  The  patient was continued on the present regimen.  She had required further  oral potassium to raise the potassium to a therapeutic range.   In view of her ischemic colitis and hypertension, her diuretic was  discontinued.  She was  started on low-dose diltiazem.  This was  gradually increased, which she tolerated reasonably well.   As the patient began to feel better with decreasing pain, her appetite  slowly returned.  Her diet was advanced thereafter.  She had mild flare-  up of abdominal pain, which was highly suggestive of spasms.  Her  regimen was adjusted and she continued to do well thereafter.   By June 04, 2008, she was feeling considerably better.  Her abdominal  pain had improved.  She was able to tolerate a soft diet without  significant pain.  No nausea or vomiting.  She was thereafter felt to be  stable for discharge.  At the time of discharge, her hemoglobin was  stable at 13.0 with normal white count.  Her potassium notably was at  3.3, but rising.  It should also be mentioned in lab work that the  patient had a repeat ANA, which was negative.  This had notably been  positive in the past.   MEDICATIONS AT THE TIME OF DISCHARGE:  1. Diltia XR 120 mg p.o. daily.  2. K-Dur 20 mEq daily.  3. Metformin 500 mg daily.  4. Calcium plus D 2 tablets daily.  5. Biotin 1 daily.  6. Gabapentin 300 mg t.i.d.  7. Levsin sublingual 1 q.4 h. p.r.n. abdominal pain or diarrhea.   She will be maintained on a soft caffeine-free diet.  She is to return  to the office in 2 weeks' time for followup.  She will arrange for  followup with Dr. Elnoria Howard in the next 3-4 weeks' time as well.           ______________________________  Lind Guest. August Saucer, M.D.     ELD/MEDQ  D:  07/11/2008  T:  07/12/2008  Job:  [id number]

## 2010-12-29 NOTE — Consult Note (Signed)
NAMEJOEI, FRANGOS NO.:  [id number]   MEDICAL RECORD NO.:  [id number]          PATIENT TYPE:  OBV   LOCATION:  5511                         FACILITY:  [facility]   PHYSICIAN:  Graylin Shiver, M.D.   DATE OF BIRTH:  September 19, 1955   DATE OF CONSULTATION:  03/17/2005  DATE OF DISCHARGE:                                   CONSULTATION   REASON FOR CONSULTATION:  This patient is a 55 year old black female who was  admitted to the hospital on March 15, 2005 with complaints of right flank  pain.  She had a CT scan done of her abdomen and pelvis which raised the  probability of a right-sided pyelonephritis. She is on antibiotics.  Three  weeks ago she was troubled with nausea, vomiting, and diarrhea and felt that  she had a viral gastroenteritis. This eventually cleared up; however,  earlier this week she had some recurring nausea and loose diarrheal stools.  This now has gone again.  She has not had a bowel movement in the last 2  days   She denies heartburn. She denies epigastric pain or abdominal pain. Her pain  seems to all be located up on the right flank area. She does have a little  that goes down the right lateral abdominal side, but then extends down into  the right leg area, she states.  She has not passed any blood in her stool.  The CT scan did not show any evidence of diverticulitis or appendicitis or  any gallbladder problems.   PAST HISTORY:  Hypertension.   PRIOR SURGERIES:  Hysterectomy   ALLERGIES:  None known.   CURRENT MEDICATIONS:  Noted on chart.   SYSTEMS REVIEW:  No chest pain, shortness of breath, cough or sputum  production.   PHYSICAL EXAMINATION:  GENERAL:  Physical she does not appear any acute  distress. She is nonicteric.  NECK:  Supple.  HEART:  Regular rhythm. No murmurs.  LUNGS:  Clear.  ABDOMEN:  Bowel sounds are normal. It is soft, nontender, no  hepatosplenomegaly.   In reviewing her urine, the urine appears to be clear and  without bacteria.  The patient does state that she was on some antibiotics a couple of weeks  ago when she had her gastroenteritis.   IMPRESSION:  A 55 year old female with right flank pain and the CT scan  showing areas of hypoperfusion of the right kidney, most likely consistent  with focal pyelonephritis.  No other abnormalities are noted on the CT scan  of the abdomen.   PLAN:  At this time I would recommend continuing antibiotic treatment and  observation.  Her symptoms at this time are not characteristic to me of the  GI disorder; it seems that her recent gastroenteritis has resolved.  Should  continued symptoms suggestive of an abdominal problem go on after antibiotic  therapy, then we can consider further evaluation of the GI tract; however at  this time, her current symptoms do not sound suggestive of the GI problem       SFG/MEDQ  D:  03/17/2005  T:  03/18/2005  Job:  [id number]   cc:   Minerva Areola L. August Saucer, M.D.  [address] [address]  [place]  [place] [address]  Fax: [phone]

## 2011-05-14 LAB — DIFFERENTIAL
Basophils Absolute: 0
Basophils Relative: 0
Basophils Relative: 0
Lymphs Abs: 0.5 — ABNORMAL LOW
Lymphs Abs: 1.5
Monocytes Absolute: 0.5
Monocytes Relative: 7
Neutro Abs: 7.9 — ABNORMAL HIGH
Neutrophils Relative %: 74

## 2011-05-14 LAB — GLUCOSE, CAPILLARY
Glucose-Capillary: 110 — ABNORMAL HIGH
Glucose-Capillary: 113 — ABNORMAL HIGH
Glucose-Capillary: 117 — ABNORMAL HIGH
Glucose-Capillary: 130 — ABNORMAL HIGH
Glucose-Capillary: 144 — ABNORMAL HIGH
Glucose-Capillary: 93

## 2011-05-14 LAB — CBC
HCT: 35.1 — ABNORMAL LOW
HCT: 37.2
HCT: 38.1
HCT: 42.7
Hemoglobin: 11.7 — ABNORMAL LOW
Hemoglobin: 13
MCHC: 33.5
MCHC: 33.6
MCHC: 33.8
MCV: 94
MCV: 95.1
Platelets: 349
Platelets: 408 — ABNORMAL HIGH
RBC: 4.03
RBC: 4.58
RDW: 12.9
RDW: 13.2
RDW: 13.2
WBC: 16.2 — ABNORMAL HIGH
WBC: 6.6

## 2011-05-14 LAB — TYPE AND SCREEN: ABO/RH(D): B POS

## 2011-05-14 LAB — BASIC METABOLIC PANEL
BUN: 3 — ABNORMAL LOW
CO2: 25
GFR calc non Af Amer: >60
Glucose, Bld: 110 — ABNORMAL HIGH
Potassium: 3.1 — ABNORMAL LOW

## 2011-05-14 LAB — COMPREHENSIVE METABOLIC PANEL
ALT: 15
Alkaline Phosphatase: 51
BUN: 3 — ABNORMAL LOW
BUN: 4 — ABNORMAL LOW
CO2: 27
Calcium: 7.9 — ABNORMAL LOW
Chloride: 103
Glucose, Bld: 116 — ABNORMAL HIGH
Glucose, Bld: 117 — ABNORMAL HIGH
Potassium: 3.3 — ABNORMAL LOW
Sodium: 136
Total Bilirubin: 0.5
Total Protein: 5.7 — ABNORMAL LOW

## 2011-05-14 LAB — HEMOGLOBIN AND HEMATOCRIT, BLOOD: Hemoglobin: 11.9 — ABNORMAL LOW

## 2011-05-14 LAB — POCT I-STAT, CHEM 8
Chloride: 102
HCT: 45
Hemoglobin: 15.3 — ABNORMAL HIGH

## 2011-05-14 LAB — EXTRACTABLE NUCLEAR ANTIGEN ANTIBODY
ENA SM Ab Ser-aCnc: <0.2 AI (ref <1.0)
SSA (Ro) (ENA) Antibody, IgG: <0.2 AI (ref <1.0)
ds DNA Ab: <1 IU/mL (ref <5)

## 2011-06-19 ENCOUNTER — Other Ambulatory Visit: Payer: Self-pay | Admitting: Internal Medicine

## 2011-06-21 ENCOUNTER — Ambulatory Visit
Admission: RE | Admit: 2011-06-21 | Discharge: 2011-06-21 | Disposition: A | Discharge status: Home / Self Care | Payer: BC Managed Care – PPO | Source: Ambulatory Visit | Attending: Internal Medicine | Admitting: Internal Medicine

## 2011-06-21 MED ORDER — IOHEXOL 300 MG/ML  SOLN: 125.0000 mL | Freq: Once | INTRAMUSCULAR | Status: AC | PRN

## 2011-06-22 ENCOUNTER — Other Ambulatory Visit: Payer: BC Managed Care – PPO

## 2011-06-22 ENCOUNTER — Encounter (HOSPITAL_COMMUNITY): Payer: Self-pay

## 2011-06-22 ENCOUNTER — Inpatient Hospital Stay (HOSPITAL_COMMUNITY)
Admission: AD | Admit: 2011-06-22 | Discharge: 2011-06-30 | DRG: 078 | Disposition: A | Discharge status: Home / Self Care | Payer: BC Managed Care – PPO | Source: Ambulatory Visit | Attending: Internal Medicine | Admitting: Internal Medicine

## 2011-06-22 DIAGNOSIS — Z8719 Personal history of other diseases of the digestive system: Secondary | ICD-10-CM

## 2011-06-22 DIAGNOSIS — Z7901 Long term (current) use of anticoagulants: Secondary | ICD-10-CM

## 2011-06-22 DIAGNOSIS — E118 Type 2 diabetes mellitus with unspecified complications: Secondary | ICD-10-CM | POA: Diagnosis present

## 2011-06-22 DIAGNOSIS — I82409 Acute embolism and thrombosis of unspecified deep veins of unspecified lower extremity: Secondary | ICD-10-CM

## 2011-06-22 DIAGNOSIS — E119 Type 2 diabetes mellitus without complications: Secondary | ICD-10-CM | POA: Diagnosis present

## 2011-06-22 DIAGNOSIS — I2699 Other pulmonary embolism without acute cor pulmonale: Secondary | ICD-10-CM

## 2011-06-22 DIAGNOSIS — R079 Chest pain, unspecified: Secondary | ICD-10-CM | POA: Diagnosis present

## 2011-06-22 DIAGNOSIS — I10 Essential (primary) hypertension: Secondary | ICD-10-CM | POA: Diagnosis present

## 2011-06-22 DIAGNOSIS — K5900 Constipation, unspecified: Secondary | ICD-10-CM

## 2011-06-22 DIAGNOSIS — M359 Systemic involvement of connective tissue, unspecified: Secondary | ICD-10-CM | POA: Diagnosis present

## 2011-06-22 DIAGNOSIS — J45909 Unspecified asthma, uncomplicated: Secondary | ICD-10-CM | POA: Diagnosis present

## 2011-06-22 DIAGNOSIS — E8881 Metabolic syndrome: Secondary | ICD-10-CM | POA: Diagnosis present

## 2011-06-22 DIAGNOSIS — E669 Obesity, unspecified: Secondary | ICD-10-CM | POA: Diagnosis present

## 2011-06-22 DIAGNOSIS — R0609 Other forms of dyspnea: Secondary | ICD-10-CM | POA: Diagnosis present

## 2011-06-22 HISTORY — DX: Vascular disorder of intestine, unspecified: K55.9

## 2011-06-22 HISTORY — DX: Metabolic syndrome: E88.810

## 2011-06-22 HISTORY — DX: Personal history of other diseases of the circulatory system: Z86.79

## 2011-06-22 HISTORY — DX: Systemic involvement of connective tissue, unspecified: M35.9

## 2011-06-22 HISTORY — DX: Personal history of other diseases of the digestive system: Z87.19

## 2011-06-22 HISTORY — DX: Metabolic syndrome: E88.81

## 2011-06-22 HISTORY — DX: Chronic sinusitis, unspecified: J32.9

## 2011-06-22 HISTORY — DX: Otitis media, unspecified, bilateral: H66.93

## 2011-06-22 HISTORY — DX: Benign neoplasm of connective and other soft tissue, unspecified: D21.9

## 2011-06-22 LAB — DIFFERENTIAL
Basophils Absolute: 0 10*3/uL (ref 0.0–0.1)
Basophils Relative: 0 % (ref 0–1)
Lymphocytes Relative: 27 % (ref 12–46)
Monocytes Absolute: 0.4 10*3/uL (ref 0.1–1.0)
Neutro Abs: 3.6 10*3/uL (ref 1.7–7.7)
Neutrophils Relative %: 62 % (ref 43–77)

## 2011-06-22 LAB — CBC
HCT: 37.6 % (ref 36.0–46.0)
RDW: 14 % (ref 11.5–15.5)
WBC: 5.7 10*3/uL (ref 4.0–10.5)

## 2011-06-22 LAB — COMPREHENSIVE METABOLIC PANEL
Alkaline Phosphatase: 56 U/L (ref 39–117)
BUN: 10 mg/dL (ref 6–23)
CO2: 28 mEq/L (ref 19–32)
Calcium: 9.4 mg/dL (ref 8.4–10.5)
GFR calc Af Amer: 71 mL/min — ABNORMAL LOW (ref >90)
GFR calc non Af Amer: 62 mL/min — ABNORMAL LOW (ref >90)
Glucose, Bld: 88 mg/dL (ref 70–99)
Potassium: 4.4 mEq/L (ref 3.5–5.1)
Total Protein: 6.9 g/dL (ref 6.0–8.3)

## 2011-06-22 LAB — PROTIME-INR
INR: 0.95 (ref 0.00–1.49)
Prothrombin Time: 12.9 seconds (ref 11.6–15.2)

## 2011-06-22 MED ORDER — SODIUM CHLORIDE 0.9 % IV SOLN: 250.0000 mL | INTRAVENOUS | Status: DC

## 2011-06-22 MED ORDER — LOSARTAN POTASSIUM 50 MG PO TABS
50.0000 mg | ORAL_TABLET | Freq: Every day | ORAL | Status: DC
  Administered 2011-06-22 – 2011-06-29 (×8): 50 mg via ORAL
  Filled 2011-06-22 (×9): qty 1

## 2011-06-22 MED ORDER — ENOXAPARIN SODIUM 80 MG/0.8ML ~~LOC~~ SOLN
70.0000 mg | SUBCUTANEOUS | Status: AC
  Administered 2011-06-22: 70 mg via SUBCUTANEOUS
  Filled 2011-06-22 (×2): qty 0.8

## 2011-06-22 MED ORDER — CYCLOBENZAPRINE HCL 5 MG PO TABS
5.0000 mg | ORAL_TABLET | Freq: Every day | ORAL | Status: DC
  Administered 2011-06-22 – 2011-06-29 (×8): 5 mg via ORAL
  Filled 2011-06-22 (×9): qty 1

## 2011-06-22 MED ORDER — ENOXAPARIN SODIUM 40 MG/0.4ML ~~LOC~~ SOLN: 40.0000 mg | SUBCUTANEOUS | Status: DC

## 2011-06-22 MED ORDER — DILTIAZEM HCL ER COATED BEADS 240 MG PO CP24
240.0000 mg | ORAL_CAPSULE | Freq: Every day | ORAL | Status: DC
  Administered 2011-06-23 – 2011-06-29 (×7): 240 mg via ORAL
  Filled 2011-06-22 (×9): qty 1

## 2011-06-22 MED ORDER — COUMADIN BOOK
Freq: Once | Status: AC
  Administered 2011-06-22: 18:00:00
  Filled 2011-06-22: qty 1

## 2011-06-22 MED ORDER — SENNA 8.6 MG PO TABS
2.0000 | ORAL_TABLET | Freq: Every day | ORAL | Status: DC | PRN
  Administered 2011-06-23: 17.2 mg via ORAL
  Filled 2011-06-22: qty 2

## 2011-06-22 MED ORDER — WARFARIN SODIUM 5 MG PO TABS
5.0000 mg | ORAL_TABLET | Freq: Once | ORAL | Status: AC
  Administered 2011-06-22: 5 mg via ORAL
  Filled 2011-06-22: qty 1

## 2011-06-22 MED ORDER — SODIUM CHLORIDE 0.9 % IJ SOLN
3.0000 mL | Freq: Two times a day (BID) | INTRAMUSCULAR | Status: DC
  Administered 2011-06-23: 10 mL via INTRAVENOUS
  Administered 2011-06-24 – 2011-06-29 (×11): 3 mL via INTRAVENOUS

## 2011-06-22 MED ORDER — METFORMIN HCL 500 MG PO TABS
500.0000 mg | ORAL_TABLET | Freq: Every day | ORAL | Status: DC
  Administered 2011-06-23 – 2011-06-30 (×8): 500 mg via ORAL
  Filled 2011-06-22 (×8): qty 1

## 2011-06-22 MED ORDER — HYDROMORPHONE HCL PF 1 MG/ML IJ SOLN
1.0000 mg | INTRAMUSCULAR | Status: DC | PRN
  Administered 2011-06-25 – 2011-06-26 (×3): 1 mg via INTRAVENOUS
  Filled 2011-06-22 (×3): qty 1

## 2011-06-22 MED ORDER — IPRATROPIUM BROMIDE 0.02 % IN SOLN: 0.5000 mg | RESPIRATORY_TRACT | Status: DC | PRN

## 2011-06-22 MED ORDER — WARFARIN VIDEO
Freq: Once | Status: AC
  Administered 2011-06-29: 19:00:00
  Filled 2011-06-22: qty 1

## 2011-06-22 MED ORDER — ACETAMINOPHEN 325 MG PO TABS
650.0000 mg | ORAL_TABLET | ORAL | Status: DC | PRN
  Administered 2011-06-22 – 2011-06-29 (×10): 650 mg via ORAL
  Filled 2011-06-22 (×4): qty 2
  Filled 2011-06-22: qty 1
  Filled 2011-06-22 (×5): qty 2

## 2011-06-22 MED ORDER — SODIUM CHLORIDE 0.9 % IJ SOLN: 3.0000 mL | INTRAMUSCULAR | Status: DC | PRN

## 2011-06-22 MED ORDER — ALBUTEROL SULFATE (5 MG/ML) 0.5% IN NEBU: 2.5000 mg | INHALATION_SOLUTION | RESPIRATORY_TRACT | Status: DC | PRN

## 2011-06-22 MED ORDER — ENOXAPARIN SODIUM 80 MG/0.8ML ~~LOC~~ SOLN
70.0000 mg | Freq: Two times a day (BID) | SUBCUTANEOUS | Status: DC
  Administered 2011-06-22: 70 mg via SUBCUTANEOUS
  Administered 2011-06-23: 10:00:00 via SUBCUTANEOUS
  Administered 2011-06-23 – 2011-06-29 (×12): 70 mg via SUBCUTANEOUS
  Filled 2011-06-22 (×18): qty 0.8

## 2011-06-22 MED ORDER — SODIUM CHLORIDE 0.45 % IV SOLN
Freq: Once | INTRAVENOUS | Status: AC
  Administered 2011-06-22: 19:00:00 via INTRAVENOUS

## 2011-06-22 MED ORDER — GABAPENTIN 300 MG PO CAPS
300.0000 mg | ORAL_CAPSULE | Freq: Three times a day (TID) | ORAL | Status: DC
  Administered 2011-06-22 – 2011-06-29 (×22): 300 mg via ORAL
  Filled 2011-06-22 (×25): qty 1

## 2011-06-22 NOTE — Progress Notes (Signed)
  Echocardiogram 2D Echocardiogram has been performed.  Juanita Laster Shawndra Clute, RDCS 06/22/2011, 1:53 PM

## 2011-06-22 NOTE — Progress Notes (Signed)
ANTICOAGULATION CONSULT NOTE - Initial Consult  Pharmacy Consult for Lovenox and Warfarin Indication: pulmonary embolus (new)  No Known Allergies  Patient Measurements: Height: 5\' 3"  (160 cm) Weight: 159 lb 13.3 oz (72.5 kg) IBW/kg (Calculated) : 52.4   Vital Signs: Temp: 97.9 F (36.6 C) (11/09 0900) Temp src: Oral (11/09 0900) BP: 149/91 mmHg (11/09 0900) Pulse Rate: 104  (11/09 0900)  Labs:  Basename 06/22/11 1015  HGB 12.8  HCT 37.6  PLT 227  APTT --  LABPROT 12.9  INR 0.95  HEPARINUNFRC --  CREATININE 1.02  CKTOTAL --  CKMB --  TROPONINI --   Estimated Creatinine Clearance: 60.8 ml/min (by C-G formula based on Cr of 1.02).  Medical History: Past Medical History  Diagnosis Date  . Essential hypertension, malignant   . Obesity, unspecified   . Acute maxillary sinusitis   . Extrinsic asthma, unspecified   . Acute bronchitis   . Ischemic colitis, enteritis, or enterocolitis     Hisotry of  . Sinusitis   . Allergic rhinitis   . Type II or unspecified type diabetes mellitus without mention of complication, not stated as uncontrolled     oral meds-insulin resistance  . History of arteritis   . H/O: GI bleed   . Fibroids   . Dysmetabolic syndrome     History of  . Undifferentiated connective tissue disease     History of  . Otitis media of both ears     Medications:  Scheduled:    . enoxaparin (LOVENOX) injection  70 mg Subcutaneous NOW  . sodium chloride  3 mL Intravenous Q12H  . DISCONTD: enoxaparin  40 mg Subcutaneous Q24H   Infusions:    . sodium chloride     PRN: acetaminophen, HYDROmorphone (DILAUDID) injection, senna, sodium chloride  Assessment: 55 yo F with new PE. No H&P available from MD yet. PMH of GI bleed noted. Lovenox 70mg  (1mg /kg) SQ x1 given at 10:30am today.  Now that labs are back and SCr > 30 ml/min, will continue to dose Lovenox at 1mg /kg SQ q12h. Will use more conservative warfarin dose to start with given hx of GIB.  Pt  will need 5 day minimum overlap with Lovenox/warfarin for new PE. Today is Day #1 of overlap.  Goal of Therapy:  INR 2-3   Plan:  1) Lovenox 70 mg SQ q12h - next dose due at 10pm tonight. 2) Warfarin 5mg  PO x1 at 18:00. 3) Warfarin book and video. 4) Pt education on warfarin to be completed by RPh prior to discharge. 5) Daily PT/INR. 6) CBC q72h per Lovenox protocol. (next due 11/12) 7) Monitor pt for S/Sx bleeding given hx of GIB.  Annia Belt 06/22/2011,11:48 AM

## 2011-06-22 NOTE — Progress Notes (Signed)
*  PRELIMINARY RESULTS* Bilateral lower venous dopplers performed. No obvious evidence of DVT, superficial thrombus or Bakers Cyst.  [name] 06/22/2011, 8:28 PM

## 2011-06-22 NOTE — H&P (Signed)
Patient:  [name]    Attending Physician: Dr. Willey Blade, MD  CC:   "It hurts to breathe."  HPI: [name] is an 55 y.o. African-American  female who states that for the past four weeks she has had a difficult time breathing.  When [name] takes a breath, she has sharp pain in her midsternal area that radiates "straight through to my back, between my shoulder blades."  The patient rates her pain as 7/10 for the last four weeks, but when she took ASA at home her pain would become tolerable at 5/10.  The patient says her pain is exacerbated when she lies down and is relieved when she sits up (her pain is positional).  The patient stated that she went to Urgent Care four weeks ago and she was told that she had a slight fever, which she says she is unusual for her.  The patient stated that she had entire body chills when she went to Urgent Care which is also unusual for her.  The patient states that she has been getting more easily fatigued and exasperated in addition to having intermittent insomnia due to her breathing pain.  The patient denies any weakness, significant weight gain or loss, diaphoresis, rashes, headache, dizziness, no changes with her vision, hearing, smelling or tasting, no cough, SOB, CP, swelling, abdominal pain, she has not been excessively hungry or thirsty, she does have a history of a connective tissue disease and has chronic pain from this, but denies any other pains.  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  ROS:   A ten-point ROS was also completed which is negative except for what is noted in the history or present illness.  Past Medical History  . Essential hypertension, malignant  . Obesity, unspecified  . Acute maxillary sinusitis  . Extrinsic asthma, unspecified  . Acute bronchitis  . Ischemic colitis, enteritis, or enterocolitis  . Sinusitis    . Allergic rhinitis  . Type II or unspecified type diabetes mellitus without mention of complication, not stated as uncontrolled  . History of arteritis  . H/O: GI bleed  . Fibroids  . Dysmetabolic syndrome  . Undifferentiated connective tissue disease  . Otitis media of both ears    Past Surgical History  Procedure Date  . Colonoscopy w/ biopsies   . Abdominal hysterectomy 2000  . Cardiac catheterization 2007    Medications:  I have reviewed the patient's current medications. Prior to Admission:  Prescriptions prior to admission  Medication Sig Dispense Refill  . aspirin EC 81 MG tablet Take 81 mg by mouth at bedtime.        . Biotin 1000 MCG tablet Take 1,000 mcg by mouth at bedtime.       . cyclobenzaprine (FLEXERIL) 5 MG tablet Take 5 mg by mouth.       . diltiazem (CARDIZEM CD) 240 MG 24 hr capsule Take 240 mg by mouth daily.       Marland Kitchen gabapentin (NEURONTIN) 300 MG capsule Take 300 mg by mouth 3 (three) times daily.       Marland Kitchen losartan (COZAAR) 50 MG tablet Take 50 mg by mouth daily.       . metFORMIN (GLUCOPHAGE) 500 MG tablet Take 500 mg by mouth daily with breakfast.        . tetrahydrozoline-zinc (VISINE-AC) 0.05-0.25 % ophthalmic solution Place 2 drops  into both eyes 3 (three) times daily as needed. For itchy eyes        Scheduled:   . coumadin book   Does not apply Once  . enoxaparin (LOVENOX) injection  70 mg Subcutaneous NOW  . enoxaparin (LOVENOX) injection  70 mg Subcutaneous Q12H  . sodium chloride  3 mL Intravenous Q12H  . warfarin  5 mg Oral ONCE-1800  . warfarin   Does not apply Once  . DISCONTD: enoxaparin  40 mg Subcutaneous Q24H   Continuous:   . 0.9% sodium chloride @ 50 ml/hr      PRN: acetaminophen, HYDROmorphone (DILAUDID) injection, senna, sodium chloride  Allergies: No Known Allergies  Family History:  HTN    (Mother (deceased), Father and Sister) DM     (Mother, Sister) Breast CA   (Numerous first-cousins on her father's side of the  family) Leukemia   (Sister) Kidney Disease (Mother and Father) MI    (Mother had MI x 3)   Social History:  The patient reports that she has never smoked. She has never used smokeless tobacco. She reports that she does drink a glass of wine once every three months. Denies illicit drugs use.  The patient is widowed, resides in [place], [place] and has a 48 year-old daughter that lives with her.  The patient is employed by [employer].  Vital Signs: Blood pressure 119/84, pulse 86, temperature 98.3 F (36.8 C), temperature source Oral, resp. rate 20, height 5\' 3"  (1.6 m), weight 159 lb 13.3 oz (72.5 kg), SpO2 99.00%.  Physical Exam: General appearance: alert, cooperative and no distress Head: Normocephalic, without obvious abnormality, atraumatic Eyes: conjunctivae/corneas clear. PERRL, EOM's intact. Fundi benign. Nose: Nares normal. Septum midline. Mucosa normal. No drainage or sinus tenderness. Throat: lips, mucosa, and tongue normal; teeth and gums normal Neck: no adenopathy, no JVD, supple, symmetrical, trachea midline and thyroid not enlarged, symmetric, no tenderness/mass/nodules Resp: diminished breath sounds anterior - bilateral, bibasilar, bilaterally and posterior - bilateral Cardio: regular rate and rhythm, S1, S2 normal, no murmur, click, rub or gallop, pain is reproducible upon palpation in the midsternal area GI: soft, non-tender; bowel sounds normal; no masses,  no organomegaly Extremities: extremities normal, atraumatic, no cyanosis or edema, Homans sign is negative, no sign of DVT and no edema, redness or tenderness in the calves or thighs Pulses: 2+ and symmetric Neurologic: Alert and oriented X 3, normal strength and tone. Normal symmetric reflexes. Normal coordination and gait Mental status: Alert, oriented, thought content appropriate Cranial nerves: normal Gait: normal Psych:  appropriate affect  Laboratories: Results for orders placed during the  hospital encounter of 06/22/11 (from the past 48 hour(s))  COMPREHENSIVE METABOLIC PANEL     Status: Abnormal   Collection Time   06/22/11 10:15 AM      Component Value Range Comment   Sodium 138  135 - 145 (mEq/L)    Potassium 4.4  3.5 - 5.1 (mEq/L)    Chloride 104  96 - 112 (mEq/L)    CO2 28  19 - 32 (mEq/L)    Glucose, Bld 88  70 - 99 (mg/dL)    BUN 10  6 - 23 (mg/dL)    Creatinine, Ser 1.61  0.50 - 1.10 (mg/dL)    Calcium 9.4  8.4 - 10.5 (mg/dL)    Total Protein 6.9  6.0 - 8.3 (g/dL)    Albumin 3.5  3.5 - 5.2 (g/dL)    AST 12  0 - 37 (U/L)  ALT 13  0 - 35 (U/L)    Alkaline Phosphatase 56  39 - 117 (U/L)    Total Bilirubin 0.2 (*) 0.3 - 1.2 (mg/dL)    GFR calc non Af Amer 62 (*) >90 (mL/min)    GFR calc Af Amer 71 (*) >90 (mL/min)   CBC     Status: Normal   Collection Time   06/22/11 10:15 AM      Component Value Range Comment   WBC 5.7  4.0 - 10.5 (K/uL)    RBC 4.05  3.87 - 5.11 (MIL/uL)    Hemoglobin 12.8  12.0 - 15.0 (g/dL)    HCT 16.1  09.6 - 04.5 (%)    MCV 92.8  78.0 - 100.0 (fL)    MCH 31.6  26.0 - 34.0 (pg)    MCHC 34.0  30.0 - 36.0 (g/dL)    RDW 40.9  81.1 - 91.4 (%)    Platelets 227  150 - 400 (K/uL)   DIFFERENTIAL     Status: Normal   Collection Time   06/22/11 10:15 AM      Component Value Range Comment   Neutrophils Relative 62  43 - 77 (%)    Neutro Abs 3.6  1.7 - 7.7 (K/uL)    Lymphocytes Relative 27  12 - 46 (%)    Lymphs Abs 1.5  0.7 - 4.0 (K/uL)    Monocytes Relative 7  3 - 12 (%)    Monocytes Absolute 0.4  0.1 - 1.0 (K/uL)    Eosinophils Relative 3  0 - 5 (%)    Eosinophils Absolute 0.2  0.0 - 0.7 (K/uL)    Basophils Relative 0  0 - 1 (%)    Basophils Absolute 0.0  0.0 - 0.1 (K/uL)   PROTIME-INR     Status: Normal   Collection Time   06/22/11 10:15 AM      Component Value Range Comment   Prothrombin Time 12.9  11.6 - 15.2 (seconds)    INR 0.95  0.00 - 1.49      Diagnostics: Ct Angio Chest W/cm &/or Wo Cm  06/21/2011  *RADIOLOGY REPORT*   Clinical Data:  Chest pain, elevated D-dimer. BUN and creatinine were obtained on site at [facility] at [address] Results:  BUN 8 mg/dL,  Creatinine 0.9 mg/dL.  CT ANGIOGRAPHY CHEST WITH CONTRAST  Technique:  Multidetector CT imaging of the chest was performed using the standard protocol during bolus administration of intravenous contrast.  Multiplanar CT image reconstructions including MIPs were obtained to evaluate the vascular anatomy.  Contrast:  125 ml Omnipaque-300 IV  Comparison:  None  Findings:  There are multiple segmental pulmonary emboli bilaterally. Good contrast opacification of the thoracic aorta with no evidence of dissection, aneurysm, or stenosis. There is classic 3-vessel brachiocephalic arch anatomy.  No pleural or pericardial effusion.  No hilar or mediastinal adenopathy.  There is minimal dependent atelectasis posteriorly in the lower lobes.  Lungs otherwise clear.  Regional bones unremarkable.  Review of the MIP images confirms the above findings.  IMPRESSION:  1.  Positive for multiple bilateral segmental pulmonary emboli.  I telephoned the critical test results to Dr. August Saucer at the time of interpretation.  Original Report Authenticated By: Osa Craver, M.D.    Assessment/Plan: Bilateral Pulmonary Emboli Connective Tissue Disorder HTN DM  The patient is receiving anticoagulant therapy with Lovenox and Coumadin.  The patient is receiving Coumadin and Lovenox education.   Daily PT/INR's will  be monitored.  The patient is also receiving supplemental oxygen therapy.  The patient's 2D Echo and bilateral LE venous duplex are pending at this time.  The patient is receiving pain management, IS instruction on usage, IV hydration,  low sodium diet, bedrest with BRP, I&O's, daily weights, telemetry monitoring, VS per routine, nebulizer treatments PRN, continuation of her home medications and blood glucose monitoring.  Further plan of care will be decided upon the  the patient's clinical course.  Discussed and agreed upon plan of care with the patient.   Larina Bras, NP-C 06/22/2011, 4:46 PM

## 2011-06-23 LAB — COMPREHENSIVE METABOLIC PANEL
ALT: 12 U/L (ref 0–35)
BUN: 9 mg/dL (ref 6–23)
CO2: 25 mEq/L (ref 19–32)
Calcium: 9.1 mg/dL (ref 8.4–10.5)
Creatinine, Ser: 0.9 mg/dL (ref 0.50–1.10)
GFR calc Af Amer: 83 mL/min — ABNORMAL LOW (ref >90)
GFR calc non Af Amer: 72 mL/min — ABNORMAL LOW (ref >90)
Glucose, Bld: 92 mg/dL (ref 70–99)
Total Protein: 6.4 g/dL (ref 6.0–8.3)

## 2011-06-23 LAB — PRO B NATRIURETIC PEPTIDE: Pro B Natriuretic peptide (BNP): 5 pg/mL (ref 0–125)

## 2011-06-23 LAB — GLUCOSE, CAPILLARY: Glucose-Capillary: 103 mg/dL — ABNORMAL HIGH (ref 70–99)

## 2011-06-23 MED ORDER — WARFARIN SODIUM 5 MG PO TABS
5.0000 mg | ORAL_TABLET | Freq: Once | ORAL | Status: AC
  Administered 2011-06-23: 5 mg via ORAL
  Filled 2011-06-23: qty 1

## 2011-06-23 MED ORDER — IBUPROFEN 200 MG PO TABS
200.0000 mg | ORAL_TABLET | Freq: Four times a day (QID) | ORAL | Status: DC | PRN
  Administered 2011-06-23 – 2011-06-24 (×2): 200 mg via ORAL
  Filled 2011-06-23 (×3): qty 1

## 2011-06-23 NOTE — Progress Notes (Signed)
Subjective:  The patient reports she's gradually feeling better. She still has intermittent substernal pressure pain with activity. She rates her pain as a 5/10. She does have some shortness of breath associated with this activity. There has been no diaphoresis. Most of the pain has been manageable with Tylenol. There's been no other new complaints. She is able to rest. She has not needed supplemental oxygen.    No Known Allergies Current Facility-Administered Medications  Medication Dose Route Frequency Provider Last Rate Last Dose  . 0.45 % sodium chloride infusion   Intravenous Once Jacqueline Hunter 50 mL/hr at 06/22/11 1844    . acetaminophen (TYLENOL) tablet 650 mg  650 mg Oral Q4H PRN Willey Blade, MD   650 mg at 06/23/11 1610  . ipratropium (ATROVENT) nebulizer solution 0.5 mg  0.5 mg Nebulization Q4H PRN Larina Bras       And  . albuterol (PROVENTIL) (5 MG/ML) 0.5% nebulizer solution 2.5 mg  2.5 mg Nebulization Q4H PRN Larina Bras      . coumadin book   Does not apply Once Annia Belt, PHARMD      . cyclobenzaprine (FLEXERIL) tablet 5 mg  5 mg Oral QHS Jacqueline Hunter   5 mg at 06/22/11 2200  . diltiazem (CARDIZEM CD) 24 hr capsule 240 mg  240 mg Oral Daily Jacqueline Hunter   240 mg at 06/23/11 0949  . enoxaparin (LOVENOX) injection 70 mg  70 mg Subcutaneous Q12H Annia Belt, PHARMD      . gabapentin (NEURONTIN) capsule 300 mg  300 mg Oral TID Jacqueline Hunter   300 mg at 06/23/11 0949  . HYDROmorphone (DILAUDID) injection 1 mg  1 mg Intravenous Q3H PRN Willey Blade, MD      . losartan (COZAAR) tablet 50 mg  50 mg Oral Daily Jacqueline Hunter   50 mg at 06/23/11 0949  . metFORMIN (GLUCOPHAGE) tablet 500 mg  500 mg Oral Q breakfast Jacqueline Hunter   500 mg at 06/23/11 0809  . senna (SENOKOT) tablet 17.2 mg  2 tablet Oral Daily PRN Willey Blade, MD      . sodium chloride 0.9 % injection 3 mL  3 mL Intravenous Q12H Willey Blade, MD      . warfarin (COUMADIN)  tablet 5 mg  5 mg Oral ONCE-1800 Annia Belt, PHARMD   5 mg at 06/22/11 1742  . warfarin (COUMADIN) tablet 5 mg  5 mg Oral ONCE-1800 Terri L Green, PHARMD      . warfarin (COUMADIN) video   Does not apply Once Annia Belt, PHARMD      . DISCONTD: 0.9 %  sodium chloride infusion  250 mL Intravenous Continuous Willey Blade, MD      . DISCONTD: sodium chloride 0.9 % injection 3 mL  3 mL Intravenous PRN Willey Blade, MD        Objective: Blood pressure 117/78, pulse 95, temperature 98.2 F (36.8 C), temperature source Oral, resp. rate 18, height 5\' 3"  (1.6 m), weight 160 lb 11.5 oz (72.9 kg), SpO2 99.00%.  Well-developed well-nourished black female in no acute distress. HEENT: No sinus tenderness. NECK: No posterior cervical nodes. LUNGS: Clear to auscultation and percussion. She has a few scattered patchy vocal fremitus in the right base. No CVA tenderness. CV: Normal S1, S2 without S3. No murmurs or rubs. ABD: Soft nontender. MSK: Negative Homans. No edema. NEURO: Neurologically intact.  Lab results: Results for orders placed during the hospital encounter of 06/22/11 (from the past 48  hour(s))  COMPREHENSIVE METABOLIC PANEL     Status: Abnormal   Collection Time   06/22/11 10:15 AM      Component Value Range Comment   Sodium 138  135 - 145 (mEq/L)    Potassium 4.4  3.5 - 5.1 (mEq/L)    Chloride 104  96 - 112 (mEq/L)    CO2 28  19 - 32 (mEq/L)    Glucose, Bld 88  70 - 99 (mg/dL)    BUN 10  6 - 23 (mg/dL)    Creatinine, Ser 1.61  0.50 - 1.10 (mg/dL)    Calcium 9.4  8.4 - 10.5 (mg/dL)    Total Protein 6.9  6.0 - 8.3 (g/dL)    Albumin 3.5  3.5 - 5.2 (g/dL)    AST 12  0 - 37 (U/L)    ALT 13  0 - 35 (U/L)    Alkaline Phosphatase 56  39 - 117 (U/L)    Total Bilirubin 0.2 (*) 0.3 - 1.2 (mg/dL)    GFR calc non Af Amer 62 (*) >90 (mL/min)    GFR calc Af Amer 71 (*) >90 (mL/min)   CBC     Status: Normal   Collection Time   06/22/11 10:15 AM      Component Value Range Comment    WBC 5.7  4.0 - 10.5 (K/uL)    RBC 4.05  3.87 - 5.11 (MIL/uL)    Hemoglobin 12.8  12.0 - 15.0 (g/dL)    HCT 09.6  04.5 - 40.9 (%)    MCV 92.8  78.0 - 100.0 (fL)    MCH 31.6  26.0 - 34.0 (pg)    MCHC 34.0  30.0 - 36.0 (g/dL)    RDW 81.1  91.4 - 78.2 (%)    Platelets 227  150 - 400 (K/uL)   DIFFERENTIAL     Status: Normal   Collection Time   06/22/11 10:15 AM      Component Value Range Comment   Neutrophils Relative 62  43 - 77 (%)    Neutro Abs 3.6  1.7 - 7.7 (K/uL)    Lymphocytes Relative 27  12 - 46 (%)    Lymphs Abs 1.5  0.7 - 4.0 (K/uL)    Monocytes Relative 7  3 - 12 (%)    Monocytes Absolute 0.4  0.1 - 1.0 (K/uL)    Eosinophils Relative 3  0 - 5 (%)    Eosinophils Absolute 0.2  0.0 - 0.7 (K/uL)    Basophils Relative 0  0 - 1 (%)    Basophils Absolute 0.0  0.0 - 0.1 (K/uL)   PROTIME-INR     Status: Normal   Collection Time   06/22/11 10:15 AM      Component Value Range Comment   Prothrombin Time 12.9  11.6 - 15.2 (seconds)    INR 0.95  0.00 - 1.49    PROTIME-INR     Status: Normal   Collection Time   06/23/11  5:12 AM      Component Value Range Comment   Prothrombin Time 14.1  11.6 - 15.2 (seconds)    INR 1.07  0.00 - 1.49    COMPREHENSIVE METABOLIC PANEL     Status: Abnormal   Collection Time   06/23/11  5:12 AM      Component Value Range Comment   Sodium 138  135 - 145 (mEq/L)    Potassium 3.7  3.5 - 5.1 (mEq/L)    Chloride 106  96 -  112 (mEq/L)    CO2 25  19 - 32 (mEq/L)    Glucose, Bld 92  70 - 99 (mg/dL)    BUN 9  6 - 23 (mg/dL)    Creatinine, Ser 1.61  0.50 - 1.10 (mg/dL)    Calcium 9.1  8.4 - 10.5 (mg/dL)    Total Protein 6.4  6.0 - 8.3 (g/dL)    Albumin 3.4 (*) 3.5 - 5.2 (g/dL)    AST 11  0 - 37 (U/L)    ALT 12  0 - 35 (U/L)    Alkaline Phosphatase 53  39 - 117 (U/L)    Total Bilirubin 0.4  0.3 - 1.2 (mg/dL)    GFR calc non Af Amer 72 (*) >90 (mL/min)    GFR calc Af Amer 83 (*) >90 (mL/min)   PRO B NATRIURETIC PEPTIDE     Status: Normal   Collection  Time   06/23/11  5:12 AM      Component Value Range Comment   BNP, POC 5.0  0 - 125 (pg/mL)   GLUCOSE, CAPILLARY     Status: Abnormal   Collection Time   06/23/11  7:56 AM      Component Value Range Comment   Glucose-Capillary 103 (*) 70 - 99 (mg/dL)    Venous Dopplers of the lower extremities were negative for DVT or Baker's cyst.  Impression: There is no problem list on file for this patient.  1. Bilateral pulmonary emboli of questionable etiology. 2. Chest pain secondary to #1. Rule out other. 2-D echo pending. 3. Sub-therapeutic Coumadin. 4. Mild diabetes mellitus. 5. Hypertension, presently stable. 6. Rule out seronegative connective tissue disease. She has had positive ANAs in the past.  Plan: 1. Continue her present regimen her Lovenox and Coumadin per pharmacy protocol. 2. We'll obtain baseline ANA, CRP, CPK and troponin I. 3. Followup 2-D echo when available. EKG in a.m.   August Saucer, Emran Molzahn 06/23/2011

## 2011-06-23 NOTE — Progress Notes (Signed)
ANTICOAGULATION CONSULT NOTE - Initial Consult  Pharmacy Consult for Lovenox and Warfarin Indication: pulmonary embolus (new)  No Known Allergies  Patient Measurements: Height: 5\' 3"  (160 cm) Weight: 160 lb 11.5 oz (72.9 kg) IBW/kg (Calculated) : 52.4   Vital Signs: Temp: 97.5 F (36.4 C) (11/10 0900) Temp src: Oral (11/10 0900) BP: 120/79 mmHg (11/10 0900) Pulse Rate: 94  (11/10 0900)  Labs:  Basename 06/23/11 0512 06/22/11 1015  HGB -- 12.8  HCT -- 37.6  PLT -- 227  APTT -- --  LABPROT 14.1 12.9  INR 1.07 0.95  HEPARINUNFRC -- --  CREATININE 0.90 1.02  CKTOTAL -- --  CKMB -- --  TROPONINI -- --   Estimated Creatinine Clearance: 69.2 ml/min (by C-G formula based on Cr of 0.9).  Medical History: Past Medical History  Diagnosis Date  . Essential hypertension, malignant   . Obesity, unspecified   . Acute maxillary sinusitis   . Extrinsic asthma, unspecified   . Acute bronchitis   . Ischemic colitis, enteritis, or enterocolitis     Hisotry of  . Sinusitis   . Allergic rhinitis   . Type II or unspecified type diabetes mellitus without mention of complication, not stated as uncontrolled     oral meds-insulin resistance  . History of arteritis   . H/O: GI bleed   . Fibroids   . Dysmetabolic syndrome     History of  . Undifferentiated connective tissue disease     History of  . Otitis media of both ears     Medications:  Scheduled:     . sodium chloride   Intravenous Once  . coumadin book   Does not apply Once  . cyclobenzaprine  5 mg Oral QHS  . diltiazem  240 mg Oral Daily  . enoxaparin (LOVENOX) injection  70 mg Subcutaneous Q12H  . gabapentin  300 mg Oral TID  . losartan  50 mg Oral Daily  . metFORMIN  500 mg Oral Q breakfast  . sodium chloride  3 mL Intravenous Q12H  . warfarin  5 mg Oral ONCE-1800  . warfarin   Does not apply Once   Infusions:     . DISCONTD: sodium chloride     PRN: acetaminophen, albuterol, HYDROmorphone (DILAUDID)  injection, ipratropium, senna, DISCONTD: sodium chloride  Assessment: 55 yo F with new PE. No H&P available from MD yet. PMH of GI bleed noted. Lovenox 70mg  (1mg /kg) SQ x1 given at 10:30am today.  Now that labs are back and SCr > 30 ml/min, will continue to dose Lovenox at 1mg /kg SQ q12h. Will use more conservative warfarin dose to start with given hx of GIB.  Pt will need 5 day minimum overlap with Lovenox/warfarin for new PE. Today is Day #2 of overlap.  Goal of Therapy:  INR 2-3   Plan:  1) Lovenox 70 mg SQ q12h - next dose due at 10pm tonight. 2) Warfarin 5mg  PO x1 at 18:00. 3) Warfarin book and video. 4) Pt education on warfarin to be completed by RPh prior to discharge. 5) Daily PT/INR. 6) CBC q72h per Lovenox protocol. (next due 11/12) 7) Monitor pt for S/Sx bleeding given hx of GIB.  Chilton Si, Murvin Gift L 06/23/2011,2:18 PM

## 2011-06-24 ENCOUNTER — Other Ambulatory Visit: Payer: Self-pay

## 2011-06-24 LAB — GLUCOSE, CAPILLARY
Glucose-Capillary: 89 mg/dL (ref 70–99)
Glucose-Capillary: 94 mg/dL (ref 70–99)

## 2011-06-24 LAB — C-REACTIVE PROTEIN: CRP: 0.22 mg/dL — ABNORMAL LOW (ref <0.60)

## 2011-06-24 LAB — PROTIME-INR: INR: 1.02 (ref 0.00–1.49)

## 2011-06-24 LAB — CK TOTAL AND CKMB (NOT AT ARMC): Relative Index: INVALID (ref 0.0–2.5)

## 2011-06-24 MED ORDER — WARFARIN SODIUM 7.5 MG PO TABS
7.5000 mg | ORAL_TABLET | Freq: Once | ORAL | Status: AC
  Administered 2011-06-24: 7.5 mg via ORAL
  Filled 2011-06-24: qty 1

## 2011-06-24 NOTE — Progress Notes (Signed)
ANTICOAGULATION CONSULT NOTE - Follow up Pharmacy Consult for Lovenox and Warfarin Indication: pulmonary embolus (new)  No Known Allergies  Patient Measurements: Height: 5\' 3"  (160 cm) Weight: 159 lb 2.8 oz (72.2 kg) IBW/kg (Calculated) : 52.4   Vital Signs: Temp: 98.5 F (36.9 C) (11/11 1336) Temp src: Oral (11/11 1336) BP: 129/81 mmHg (11/11 1336) Pulse Rate: 102  (11/11 1336)  Labs:  Basename 06/24/11 0535 06/24/11 0525 06/23/11 0512 06/22/11 1015  HGB -- -- -- 12.8  HCT -- -- -- 37.6  PLT -- -- -- 227  APTT -- -- -- --  LABPROT -- 13.6 14.1 12.9  INR -- 1.02 1.07 0.95  HEPARINUNFRC -- -- -- --  CREATININE -- -- 0.90 1.02  CKTOTAL 46 -- -- --  CKMB 1.7 -- -- --  TROPONINI -- -- -- --   Estimated Creatinine Clearance: 68.8 ml/min (by C-G formula based on Cr of 0.9).  Medical History: Past Medical History  Diagnosis Date  . Essential hypertension, malignant   . Obesity, unspecified   . Acute maxillary sinusitis   . Extrinsic asthma, unspecified   . Acute bronchitis   . Ischemic colitis, enteritis, or enterocolitis     Hisotry of  . Sinusitis   . Allergic rhinitis   . Type II or unspecified type diabetes mellitus without mention of complication, not stated as uncontrolled     oral meds-insulin resistance  . History of arteritis   . H/O: GI bleed   . Fibroids   . Dysmetabolic syndrome     History of  . Undifferentiated connective tissue disease     History of  . Otitis media of both ears     Medications:  Scheduled:     . cyclobenzaprine  5 mg Oral QHS  . diltiazem  240 mg Oral Daily  . enoxaparin (LOVENOX) injection  70 mg Subcutaneous Q12H  . gabapentin  300 mg Oral TID  . losartan  50 mg Oral Daily  . metFORMIN  500 mg Oral Q breakfast  . sodium chloride  3 mL Intravenous Q12H  . warfarin  5 mg Oral ONCE-1800  . warfarin   Does not apply Once   Infusions:    PRN: acetaminophen, albuterol, HYDROmorphone (DILAUDID) injection, ibuprofen,  ipratropium, senna  Assessment: 55 yo F with new PE. No H&P available from MD yet. PMH of GI bleed noted. Lovenox 70mg  (1mg /kg) SQ x1 given at 10:30am today.  Now that labs are back and SCr > 30 ml/min, will continue to dose Lovenox at 1mg /kg SQ q12h. Will use more conservative warfarin dose to start with given hx of GIB. Very little change in PT/INR with 2 x 5mg  Coumadin  Pt will need 5 day minimum overlap with Lovenox/warfarin for new PE. Today is Day #3 of overlap.  Goal of Therapy:  INR 2-3   Plan:  1) Continue Lovenox 70 mg SQ q12h - 2) Warfarin 7.5 mg PO x1 at 18:00. 3) Warfarin book and video. 4) Pt education on warfarin to be completed by RPh prior to discharge. 5) Daily PT/INR. 6) CBC q72h per Lovenox protocol. (next due 11/12) 7) Monitor pt for S/Sx bleeding given hx of GIB.  Chilton Si, Tanielle Emigh L 06/24/2011,2:03 PM

## 2011-06-24 NOTE — Plan of Care (Signed)
Problem: Phase I Progression Outcomes Goal: Pain controlled Patient will be pain free upon discharge.

## 2011-06-24 NOTE — Progress Notes (Signed)
Subjective:  The patient reports she's feeling about the same. She still is having chest pain which is pleuritic at times. She however obtained relief with her Tylenol. She continues to use incentive spirometer on a regular basis. Her maximum inspiratory force is 1000 cc before she experiences pain. There's been no diaphoresis or palpitations. No other new complaints. She has had one bowel movement. No additional new history. Review of systems otherwise unremarkable.    No Known Allergies Current Facility-Administered Medications  Medication Dose Route Frequency Provider Last Rate Last Dose  . acetaminophen (TYLENOL) tablet 650 mg  650 mg Oral Q4H PRN Willey Blade, MD   650 mg at 06/24/11 0928  . ipratropium (ATROVENT) nebulizer solution 0.5 mg  0.5 mg Nebulization Q4H PRN Larina Bras       And  . albuterol (PROVENTIL) (5 MG/ML) 0.5% nebulizer solution 2.5 mg  2.5 mg Nebulization Q4H PRN Larina Bras      . cyclobenzaprine (FLEXERIL) tablet 5 mg  5 mg Oral QHS Jacqueline Hunter   5 mg at 06/23/11 2131  . diltiazem (CARDIZEM CD) 24 hr capsule 240 mg  240 mg Oral Daily Jacqueline Hunter   240 mg at 06/24/11 1610  . enoxaparin (LOVENOX) injection 70 mg  70 mg Subcutaneous Q12H Annia Belt, PHARMD   70 mg at 06/24/11 9604  . gabapentin (NEURONTIN) capsule 300 mg  300 mg Oral TID Jacqueline Hunter   300 mg at 06/24/11 5409  . HYDROmorphone (DILAUDID) injection 1 mg  1 mg Intravenous Q3H PRN Willey Blade, MD      . ibuprofen (ADVIL,MOTRIN) tablet 200 mg  200 mg Oral Q6H PRN Willey Blade, MD   200 mg at 06/23/11 1739  . losartan (COZAAR) tablet 50 mg  50 mg Oral Daily Jacqueline Hunter   50 mg at 06/24/11 8119  . metFORMIN (GLUCOPHAGE) tablet 500 mg  500 mg Oral Q breakfast Jacqueline Hunter   500 mg at 06/24/11 0747  . senna (SENOKOT) tablet 17.2 mg  2 tablet Oral Daily PRN Willey Blade, MD   17.2 mg at 06/23/11 1739  . sodium chloride 0.9 % injection 3 mL  3 mL Intravenous Q12H Willey Blade, MD    3 mL at 06/24/11 0923  . warfarin (COUMADIN) tablet 5 mg  5 mg Oral ONCE-1800 Terri L Green, PHARMD   5 mg at 06/23/11 1739  . warfarin (COUMADIN) video   Does not apply Once Annia Belt, PHARMD        Objective: Blood pressure 144/89, pulse 112, temperature 98.7 F (37.1 C), temperature source Oral, resp. rate 18, height 5\' 3"  (1.6 m), weight 159 lb 2.8 oz (72.2 kg), SpO2 95.00%.  Well-developed well-nourished black female presently in no acute distress. HEENT: No sinus tenderness. NECK: Supple no enlarged thyroid. No posterior cervical nodes. LUNGS: Clear to auscultation. No CVA tenderness. CV: Normal S1, S2 without S3. ABD: Soft nontender. MSK: Negative Homans. No edema. NEURO: Intact.  Lab results: Results for orders placed during the hospital encounter of 06/22/11 (from the past 48 hour(s))  PROTIME-INR     Status: Normal   Collection Time   06/23/11  5:12 AM      Component Value Range Comment   Prothrombin Time 14.1  11.6 - 15.2 (seconds)    INR 1.07  0.00 - 1.49    COMPREHENSIVE METABOLIC PANEL     Status: Abnormal   Collection Time   06/23/11  5:12 AM      Component Value  Range Comment   Sodium 138  135 - 145 (mEq/L)    Potassium 3.7  3.5 - 5.1 (mEq/L)    Chloride 106  96 - 112 (mEq/L)    CO2 25  19 - 32 (mEq/L)    Glucose, Bld 92  70 - 99 (mg/dL)    BUN 9  6 - 23 (mg/dL)    Creatinine, Ser 4.09  0.50 - 1.10 (mg/dL)    Calcium 9.1  8.4 - 10.5 (mg/dL)    Total Protein 6.4  6.0 - 8.3 (g/dL)    Albumin 3.4 (*) 3.5 - 5.2 (g/dL)    AST 11  0 - 37 (U/L)    ALT 12  0 - 35 (U/L)    Alkaline Phosphatase 53  39 - 117 (U/L)    Total Bilirubin 0.4  0.3 - 1.2 (mg/dL)    GFR calc non Af Amer 72 (*) >90 (mL/min)    GFR calc Af Amer 83 (*) >90 (mL/min)   PRO B NATRIURETIC PEPTIDE     Status: Normal   Collection Time   06/23/11  5:12 AM      Component Value Range Comment   BNP, POC 5.0  0 - 125 (pg/mL)   GLUCOSE, CAPILLARY     Status: Abnormal   Collection Time     06/23/11  7:56 AM      Component Value Range Comment   Glucose-Capillary 103 (*) 70 - 99 (mg/dL)   HOMOCYSTEINE, SERUM     Status: Normal   Collection Time   06/23/11  4:35 PM      Component Value Range Comment   Homocysteine-Norm 9.2  4.0 - 15.4 (umol/L)   GLUCOSE, CAPILLARY     Status: Normal   Collection Time   06/23/11  5:03 PM      Component Value Range Comment   Glucose-Capillary 97  70 - 99 (mg/dL)   PROTIME-INR     Status: Normal   Collection Time   06/24/11  5:25 AM      Component Value Range Comment   Prothrombin Time 13.6  11.6 - 15.2 (seconds)    INR 1.02  0.00 - 1.49    CK TOTAL AND CKMB     Status: Normal   Collection Time   06/24/11  5:35 AM      Component Value Range Comment   Total CK 46  7 - 177 (U/L)    CK, MB 1.7  0.3 - 4.0 (ng/mL)    Relative Index RELATIVE INDEX IS INVALID  0.0 - 2.5    C-REACTIVE PROTEIN     Status: Abnormal   Collection Time   06/24/11  5:35 AM      Component Value Range Comment   CRP 0.22 (*) <0.60 (mg/dL)   GLUCOSE, CAPILLARY     Status: Normal   Collection Time   06/24/11  7:44 AM      Component Value Range Comment   Glucose-Capillary 89  70 - 99 (mg/dL)    @[email or web address] @  Impression: There is no problem list on file for this patient.  1. Pulmonary embolus. She has bilateral multiple pulmonary emboli by CT scan. 2. Rule out hypercoagulable state. Her INR has not started to increase on her present regimen of Coumadin. Pharmacy is following. 3. Hypertension presently stable. 4. Chest pain secondary to #1.  Plan: 1. Continue her present regimen. 2. Followup her 2-D echo and coagulation panel. 3. Continue incentive spirometry. Coumadin regimen per pharmacy protocol.  August Saucer, Sereen Schaff 06/24/2011

## 2011-06-25 ENCOUNTER — Other Ambulatory Visit: Payer: BC Managed Care – PPO

## 2011-06-25 DIAGNOSIS — E118 Type 2 diabetes mellitus with unspecified complications: Secondary | ICD-10-CM | POA: Diagnosis present

## 2011-06-25 DIAGNOSIS — M359 Systemic involvement of connective tissue, unspecified: Secondary | ICD-10-CM | POA: Diagnosis present

## 2011-06-25 DIAGNOSIS — I10 Essential (primary) hypertension: Secondary | ICD-10-CM | POA: Diagnosis present

## 2011-06-25 LAB — CBC
Hemoglobin: 13 g/dL (ref 12.0–15.0)
MCHC: 32.8 g/dL (ref 30.0–36.0)
Platelets: 248 10*3/uL (ref 150–400)
RDW: 13.9 % (ref 11.5–15.5)

## 2011-06-25 LAB — PROTEIN C, TOTAL: Protein C, Total: 169 % — ABNORMAL HIGH (ref 72–160)

## 2011-06-25 LAB — PROTIME-INR
INR: 1.05 (ref 0.00–1.49)
Prothrombin Time: 13.9 seconds (ref 11.6–15.2)

## 2011-06-25 LAB — LUPUS ANTICOAGULANT PANEL
DRVVT: 36.7 secs (ref 34.1–42.2)
PTT Lupus Anticoagulant: 40.9 secs (ref 28.0–43.0)

## 2011-06-25 LAB — PROTEIN C ACTIVITY: Protein C Activity: 178 % — ABNORMAL HIGH (ref 75–133)

## 2011-06-25 LAB — GLUCOSE, CAPILLARY: Glucose-Capillary: 90 mg/dL (ref 70–99)

## 2011-06-25 MED ORDER — WARFARIN SODIUM 10 MG PO TABS
10.0000 mg | ORAL_TABLET | Freq: Once | ORAL | Status: AC
  Administered 2011-06-25: 10 mg via ORAL
  Filled 2011-06-25: qty 1

## 2011-06-25 MED ORDER — ONDANSETRON HCL 4 MG/2ML IJ SOLN: 4.0000 mg | Freq: Four times a day (QID) | INTRAMUSCULAR | Status: DC | PRN

## 2011-06-25 NOTE — Progress Notes (Signed)
Subjective: The patient seen on rounds today.  The patient states that she continues to have midsternal chest pain on exertion and when she takes a deep breath. She rates her pain as 5/10, however she obtains relief with Tylenol. She continues to use incentive spirometer on a regular basis.  The patient did inquire about the results of her 2D echo cardiogram which is still pending at this time.  Spoke to the patient's sister Gavin Pound) about her hospitalization at the patient's request.  No new nursing concerns.     No Known Allergies Current Facility-Administered Medications  Medication Dose Route Frequency Provider Last Rate Last Dose  . acetaminophen (TYLENOL) tablet 650 mg  650 mg Oral Q4H PRN Willey Blade, MD   650 mg at 06/25/11 1042  . ipratropium (ATROVENT) nebulizer solution 0.5 mg  0.5 mg Nebulization Q4H PRN Larina Bras       And  . albuterol (PROVENTIL) (5 MG/ML) 0.5% nebulizer solution 2.5 mg  2.5 mg Nebulization Q4H PRN Larina Bras      . cyclobenzaprine (FLEXERIL) tablet 5 mg  5 mg Oral QHS Kasia Trego   5 mg at 06/24/11 2200  . diltiazem (CARDIZEM CD) 24 hr capsule 240 mg  240 mg Oral Daily Jandi Swiger   240 mg at 06/25/11 1027  . enoxaparin (LOVENOX) injection 70 mg  70 mg Subcutaneous Q12H Annia Belt, PHARMD   70 mg at 06/25/11 1000  . gabapentin (NEURONTIN) capsule 300 mg  300 mg Oral TID Anjelica Gorniak   300 mg at 06/25/11 1027  . HYDROmorphone (DILAUDID) injection 1 mg  1 mg Intravenous Q3H PRN Willey Blade, MD   1 mg at 06/25/11 1317  . ibuprofen (ADVIL,MOTRIN) tablet 200 mg  200 mg Oral Q6H PRN Willey Blade, MD   200 mg at 06/24/11 1347  . losartan (COZAAR) tablet 50 mg  50 mg Oral Daily Eschol Auxier   50 mg at 06/25/11 1027  . metFORMIN (GLUCOPHAGE) tablet 500 mg  500 mg Oral Q breakfast Nyasiah Moffet   500 mg at 06/25/11 0803  . ondansetron (ZOFRAN) injection 4 mg  4 mg Intravenous Q6H PRN Larina Bras      . senna (SENOKOT) tablet  17.2 mg  2 tablet Oral Daily PRN Willey Blade, MD   17.2 mg at 06/23/11 1739  . sodium chloride 0.9 % injection 3 mL  3 mL Intravenous Q12H Willey Blade, MD   3 mL at 06/25/11 1000  . warfarin (COUMADIN) tablet 10 mg  10 mg Oral ONCE-1800 Willey Blade, MD      . warfarin (COUMADIN) tablet 7.5 mg  7.5 mg Oral ONCE-1800 Terri L Green, PHARMD   7.5 mg at 06/24/11 1734  . warfarin (COUMADIN) video   Does not apply Once Annia Belt, PHARMD        Objective: Blood pressure 135/88, pulse 89, temperature 98.2 F (36.8 C), temperature source Oral, resp. rate 20, height 5\' 3"  (1.6 m), weight 156 lb 12 oz (71.1 kg), SpO2 100.00%.  GENERAL:  Well-developed well-nourished black female presently in no acute distress. HEENT: Atraumatic, normocephalic PERLA, EOMI, no icterus, no sinus tenderness, full dentition  NECK: Supple no enlarged thyroid. No posterior cervical nodes LUNGS: Clear to auscultation bilaterally, no CVA tenderness, diminished bibasilar breath sounds L>R, dyspnea and midsternal CP on exertion, pain is reproducible with palpation. CV: Normal S1, S2 without S3. ABD: Soft,  NTND,  Hypoactive bowel sounds. MS: Negative Homans. No edema,  full ROM NEURO: Intact,  Cranial nerves 2-12 intact, no focal deficits.  Lab results: Results for orders placed during the hospital encounter of 06/22/11 (from the past 48 hour(s))  PROTEIN C ACTIVITY     Status: Abnormal   Collection Time   06/23/11  4:35 PM      Component Value Range Comment   Protein C Activity 178 (*) 75 - 133 (%)   PROTEIN C, TOTAL     Status: Abnormal   Collection Time   06/23/11  4:35 PM      Component Value Range Comment   Protein C, Total 169 (*) 72 - 160 (%)   PROTEIN S ACTIVITY     Status: Abnormal   Collection Time   06/23/11  4:35 PM      Component Value Range Comment   Protein S Activity 59 (*) 69 - 129 (%)   PROTEIN S, TOTAL     Status: Normal   Collection Time   06/23/11  4:35 PM      Component Value Range Comment     Protein S Ag, Total 90  60 - 150 (%)   LUPUS ANTICOAGULANT PANEL     Status: Normal   Collection Time   06/23/11  4:35 PM      Component Value Range Comment   PTT Lupus Anticoagulant 40.9  28.0 - 43.0 (secs)    PTTLA Confirmation NOT APPL  <8.0 (secs)    PTTLA 4:1 Mix NOT APPL  28.0 - 43.0 (secs)    Drvvt 36.7  34.1 - 42.2 (secs)    Drvvt confirmation NOT APPL  <1.16 (Ratio)    dRVVT Incubated 1:1 Mix NOT APPL  34.1 - 42.2 (secs)    Lupus Anticoagulant NOT DETECTED  NOT DETECTED    HOMOCYSTEINE, SERUM     Status: Normal   Collection Time   06/23/11  4:35 PM      Component Value Range Comment   Homocysteine-Norm 9.2  4.0 - 15.4 (umol/L)   GLUCOSE, CAPILLARY     Status: Normal   Collection Time   06/23/11  5:03 PM      Component Value Range Comment   Glucose-Capillary 97  70 - 99 (mg/dL)   PROTIME-INR     Status: Normal   Collection Time   06/24/11  5:25 AM      Component Value Range Comment   Prothrombin Time 13.6  11.6 - 15.2 (seconds)    INR 1.02  0.00 - 1.49    CK TOTAL AND CKMB     Status: Normal   Collection Time   06/24/11  5:35 AM      Component Value Range Comment   Total CK 46  7 - 177 (U/L)    CK, MB 1.7  0.3 - 4.0 (ng/mL)    Relative Index RELATIVE INDEX IS INVALID  0.0 - 2.5    C-REACTIVE PROTEIN     Status: Abnormal   Collection Time   06/24/11  5:35 AM      Component Value Range Comment   CRP 0.22 (*) <0.60 (mg/dL)   GLUCOSE, CAPILLARY     Status: Normal   Collection Time   06/24/11  7:44 AM      Component Value Range Comment   Glucose-Capillary 89  70 - 99 (mg/dL)   GLUCOSE, CAPILLARY     Status: Normal   Collection Time   06/24/11  4:47 PM      Component Value Range Comment   Glucose-Capillary 94  70 -  99 (mg/dL)   PROTIME-INR     Status: Normal   Collection Time   06/25/11  4:50 AM      Component Value Range Comment   Prothrombin Time 13.9  11.6 - 15.2 (seconds)    INR 1.05  0.00 - 1.49    CBC     Status: Normal   Collection Time   06/25/11   4:50 AM      Component Value Range Comment   WBC 4.8  4.0 - 10.5 (K/uL)    RBC 4.23  3.87 - 5.11 (MIL/uL)    Hemoglobin 13.0  12.0 - 15.0 (g/dL)    HCT 16.1  09.6 - 04.5 (%)    MCV 93.6  78.0 - 100.0 (fL)    MCH 30.7  26.0 - 34.0 (pg)    MCHC 32.8  30.0 - 36.0 (g/dL)    RDW 40.9  81.1 - 91.4 (%)    Platelets 248  150 - 400 (K/uL)     Impression: Patient Active Problem List  Diagnoses  . Pulmonary embolism  . HTN (hypertension)  . DM (diabetes mellitus)  . Connective tissue disorder   1. Pulmonary embolus. She has bilateral multiple pulmonary emboli by CT scan. 2. Rule out hypercoagulable state. Her INR has not started to increase on her present regimen of Coumadin. Pharmacy is managing. 3. Hypertension presently stable. 4. Chest pain secondary to #1.  Plan: 1. Continue her present regimen. 2. Followup her 2-D echo and coagulation panel. 3. Continue incentive spirometry. Coumadin regimen per pharmacy protocol. 4. Supplemental oxygen therapy   Larina Bras, NP-C   06/25/2011 1615PM

## 2011-06-25 NOTE — Progress Notes (Signed)
ANTICOAGULATION CONSULT NOTE - Follow up Pharmacy Consult for Lovenox and Warfarin Indication: pulmonary embolus (new)  No Known Allergies  Patient Measurements: Height: 5\' 3"  (160 cm) Weight: 156 lb 12 oz (71.1 kg) IBW/kg (Calculated) : 52.4   Vital Signs: Temp: 98.4 F (36.9 C) (11/12 0454) Temp src: Oral (11/12 0454) BP: 117/80 mmHg (11/12 0454) Pulse Rate: 87  (11/12 0454)  Labs:  Basename 06/25/11 0450 06/24/11 0535 06/24/11 0525 06/23/11 0512 06/22/11 1015  HGB 13.0 -- -- -- 12.8  HCT 39.6 -- -- -- 37.6  PLT 248 -- -- -- 227  APTT -- -- -- -- --  LABPROT 13.9 -- 13.6 14.1 --  INR 1.05 -- 1.02 1.07 --  HEPARINUNFRC -- -- -- -- --  CREATININE -- -- -- 0.90 1.02  CKTOTAL -- 46 -- -- --  CKMB -- 1.7 -- -- --  TROPONINI -- -- -- -- --   Estimated Creatinine Clearance: 68.4 ml/min (by C-G formula based on Cr of 0.9).  Medical History: Past Medical History  Diagnosis Date  . Essential hypertension, malignant   . Obesity, unspecified   . Acute maxillary sinusitis   . Extrinsic asthma, unspecified   . Acute bronchitis   . Ischemic colitis, enteritis, or enterocolitis     Hisotry of  . Sinusitis   . Allergic rhinitis   . Type II or unspecified type diabetes mellitus without mention of complication, not stated as uncontrolled     oral meds-insulin resistance  . History of arteritis   . H/O: GI bleed   . Fibroids   . Dysmetabolic syndrome     History of  . Undifferentiated connective tissue disease     History of  . Otitis media of both ears     Medications:  Scheduled:     . cyclobenzaprine  5 mg Oral QHS  . diltiazem  240 mg Oral Daily  . enoxaparin (LOVENOX) injection  70 mg Subcutaneous Q12H  . gabapentin  300 mg Oral TID  . losartan  50 mg Oral Daily  . metFORMIN  500 mg Oral Q breakfast  . sodium chloride  3 mL Intravenous Q12H  . warfarin  7.5 mg Oral ONCE-1800  . warfarin   Does not apply Once   Infusions:    PRN: acetaminophen, albuterol,  HYDROmorphone (DILAUDID) injection, ibuprofen, ipratropium, senna  Assessment: 55 yo F with new PE. No H&P available from MD yet. PMH of GI bleed noted. Lovenox 70mg  (1mg /kg) SQ x1 given at 10:30am today.  Now that labs are back and SCr > 30 ml/min, will continue to dose Lovenox at 1mg /kg SQ q12h. Will use more conservative warfarin dose to start with given hx of GIB. Very little change in PT/INR after 5mg ,, 5mg , 7.5mg   Pt will need 5 day minimum overlap with Lovenox/warfarin for new PE. Today is Day #4 of overlap.  Goal of Therapy:  INR 2-3   Plan:  1) Continue Lovenox 70 mg SQ q12h - 2) Warfarin 10mg  PO x1 at 18:00. 3) Pt education on warfarin to be completed by RPh prior to      discharge. 5) Daily PT/INR. 6) CBC q72h per Lovenox protocol. (next due 11/12) 7) Monitor pt for S/Sx bleeding given hx of GIB.  Loralee Pacas R 06/25/2011,7:51 AM

## 2011-06-26 DIAGNOSIS — K5900 Constipation, unspecified: Secondary | ICD-10-CM

## 2011-06-26 LAB — COMPREHENSIVE METABOLIC PANEL
BUN: 11 mg/dL (ref 6–23)
CO2: 27 mEq/L (ref 19–32)
Chloride: 103 mEq/L (ref 96–112)
Creatinine, Ser: 1.03 mg/dL (ref 0.50–1.10)
GFR calc non Af Amer: 61 mL/min — ABNORMAL LOW (ref >90)
Glucose, Bld: 94 mg/dL (ref 70–99)
Total Bilirubin: 0.1 mg/dL — ABNORMAL LOW (ref 0.3–1.2)

## 2011-06-26 LAB — GLUCOSE, CAPILLARY: Glucose-Capillary: 102 mg/dL — ABNORMAL HIGH (ref 70–99)

## 2011-06-26 LAB — CARDIOLIPIN ANTIBODIES, IGG, IGM, IGA
Anticardiolipin IgG: 7 GPL U/mL — ABNORMAL LOW (ref <23)
Anticardiolipin IgM: 1 MPL U/mL — ABNORMAL LOW (ref <11)

## 2011-06-26 LAB — BETA-2-GLYCOPROTEIN I ABS, IGG/M/A
Beta-2 Glyco I IgG: 0 G Units (ref <20)
Beta-2-Glycoprotein I IgM: 1 M Units (ref <20)

## 2011-06-26 MED ORDER — SENNOSIDES-DOCUSATE SODIUM 8.6-50 MG PO TABS
2.0000 | ORAL_TABLET | Freq: Every day | ORAL | Status: DC
  Administered 2011-06-26 – 2011-06-28 (×3): 2 via ORAL
  Filled 2011-06-26 (×5): qty 2

## 2011-06-26 MED ORDER — WARFARIN SODIUM 10 MG PO TABS
10.0000 mg | ORAL_TABLET | Freq: Once | ORAL | Status: AC
Start: 2011-06-26 — End: 2011-06-26
  Administered 2011-06-26: 10 mg via ORAL
  Filled 2011-06-26: qty 1

## 2011-06-26 NOTE — Progress Notes (Signed)
ANTICOAGULATION CONSULT NOTE - Follow up Pharmacy Consult for Lovenox and Warfarin Indication: bilateral pulmonary embolus (new)  No Known Allergies  Patient Measurements: Height: 5\' 3"  (160 cm) Weight: 156 lb 6.4 oz (70.943 kg) IBW/kg (Calculated) : 52.4   Vital Signs: Temp: 98.3 F (36.8 C) (11/13 0505) Temp src: Oral (11/13 0505) BP: 123/80 mmHg (11/13 0505) Pulse Rate: 66  (11/13 0505)  Labs:  Basename 06/26/11 0511 06/25/11 0450 06/24/11 0535 06/24/11 0525  HGB -- 13.0 -- --  HCT -- 39.6 -- --  PLT -- 248 -- --  APTT -- -- -- --  LABPROT 16.7* 13.9 -- 13.6  INR 1.33 1.05 -- 1.02  HEPARINUNFRC -- -- -- --  CREATININE 1.03 -- -- --  CKTOTAL -- -- 46 --  CKMB -- -- 1.7 --  TROPONINI -- -- -- --   Estimated Creatinine Clearance: 59.6 ml/min (by C-G formula based on Cr of 1.03).  Medical History: Past Medical History  Diagnosis Date  . Essential hypertension, malignant   . Obesity, unspecified   . Acute maxillary sinusitis   . Extrinsic asthma, unspecified   . Acute bronchitis   . Ischemic colitis, enteritis, or enterocolitis     Hisotry of  . Sinusitis   . Allergic rhinitis   . Type II or unspecified type diabetes mellitus without mention of complication, not stated as uncontrolled     oral meds-insulin resistance  . History of arteritis   . H/O: GI bleed   . Fibroids   . Dysmetabolic syndrome     History of  . Undifferentiated connective tissue disease     History of  . Otitis media of both ears     Medications:  Scheduled:     . cyclobenzaprine  5 mg Oral QHS  . diltiazem  240 mg Oral Daily  . enoxaparin (LOVENOX) injection  70 mg Subcutaneous Q12H  . gabapentin  300 mg Oral TID  . losartan  50 mg Oral Daily  . metFORMIN  500 mg Oral Q breakfast  . sodium chloride  3 mL Intravenous Q12H  . warfarin  10 mg Oral ONCE-1800  . warfarin   Does not apply Once   Infusions:    PRN: acetaminophen, albuterol, HYDROmorphone (DILAUDID) injection,  ibuprofen, ipratropium, ondansetron (ZOFRAN) IV, senna  Assessment: 55 yo F with new PE, PMH of GI bleed noted.. Will use more conservative warfarin dose to start with given hx of GIB. Very little change in PT/INR after 5mg ,, 5mg , 7.5mg , 10mg   Pt will need 5 day minimum overlap with Lovenox/warfarin for new PE. Today is Day #5 of overlap.  Goal of Therapy:  INR 2-3   Plan:  1) Continue Lovenox 70 mg SQ q12h - 2) Warfarin 10mg  PO x1 at 18:00. 3) Pt education on warfarin completed by me on 11/12 5) Daily PT/INR. 6) CBC q72h per Lovenox protocol.  7) Monitor pt for S/Sx bleeding given hx of GIB.  Loralee Pacas R 06/26/2011,8:16 AM

## 2011-06-26 NOTE — Progress Notes (Signed)
Subjective: The patient seen on rounds today.  The patient states that she continues to have midsternal chest pain on exertion and when she takes a deep breath. The patient obtains relief with Tylenol and Dilaudid. She continues to use incentive spirometer on a regular basis and the patient is wearing supplemental oxygen.  Explained to the patient that she may also request nebulizer treatments when her chest is feeling tight and/or she is short of breath.  The patient also stated that her bowel movements have not been regular and she is requesting medication for constipation.   No new nursing concerns.     No Known Allergies Current Facility-Administered Medications  Medication Dose Route Frequency Provider Last Rate Last Dose  . acetaminophen (TYLENOL) tablet 650 mg  650 mg Oral Q4H PRN Willey Blade, MD   650 mg at 06/26/11 0511  . ipratropium (ATROVENT) nebulizer solution 0.5 mg  0.5 mg Nebulization Q4H PRN Larina Bras, NP       And  . albuterol (PROVENTIL) (5 MG/ML) 0.5% nebulizer solution 2.5 mg  2.5 mg Nebulization Q4H PRN Larina Bras, NP      . cyclobenzaprine (FLEXERIL) tablet 5 mg  5 mg Oral QHS Larina Bras, NP   5 mg at 06/25/11 2134  . diltiazem (CARDIZEM CD) 24 hr capsule 240 mg  240 mg Oral Daily Larina Bras, NP   240 mg at 06/26/11 0955  . enoxaparin (LOVENOX) injection 70 mg  70 mg Subcutaneous Q12H Annia Belt, PHARMD   70 mg at 06/26/11 0955  . gabapentin (NEURONTIN) capsule 300 mg  300 mg Oral TID Larina Bras, NP   300 mg at 06/26/11 1619  . HYDROmorphone (DILAUDID) injection 1 mg  1 mg Intravenous Q3H PRN Willey Blade, MD   1 mg at 06/25/11 2134  . ibuprofen (ADVIL,MOTRIN) tablet 200 mg  200 mg Oral Q6H PRN Willey Blade, MD   200 mg at 06/24/11 1347  . losartan (COZAAR) tablet 50 mg  50 mg Oral Daily Larina Bras, NP   50 mg at 06/26/11 0955  . metFORMIN (GLUCOPHAGE) tablet 500 mg  500 mg Oral Q breakfast Larina Bras, NP   500 mg at  06/26/11 0956  . ondansetron (ZOFRAN) injection 4 mg  4 mg Intravenous Q6H PRN Larina Bras, NP      . senna-docusate (Senokot-S) tablet 2 tablet  2 tablet Oral QHS Larina Bras, NP      . sodium chloride 0.9 % injection 3 mL  3 mL Intravenous Q12H Willey Blade, MD   3 mL at 06/26/11 0956  . warfarin (COUMADIN) tablet 10 mg  10 mg Oral ONCE-1800 Willey Blade, MD      . warfarin (COUMADIN) video   Does not apply Once Annia Belt, PHARMD      . DISCONTD: senna (SENOKOT) tablet 17.2 mg  2 tablet Oral Daily PRN Willey Blade, MD   17.2 mg at 06/23/11 1739    Objective: Blood pressure 127/87, pulse 99, temperature 98.6 F (37 C), temperature source Oral, resp. rate 20, height 5\' 3"  (1.6 m), weight 156 lb 6.4 oz (70.943 kg), SpO2 99.00%.  GENERAL:  Well-developed well-nourished black female presently in no acute distress HEENT: Atraumatic, normocephalic PERLA, EOMI, no icterus, no sinus tenderness, full dentition  NECK: Supple no enlarged thyroid. No posterior cervical nodes LUNGS: Clear to auscultation bilaterally, no CVA tenderness, diminished bibasilar breath sounds L>R, dyspnea and midsternal CP on exertion, pain is reproducible with palpation CV: Normal S1, S2, no  murmurs, clicks, rubs or gallops ABD: Soft,  NTND,  Hypoactive bowel sounds MS: Negative Homans. No edema,  full ROM NEURO: Intact, Cranial nerves 2-12 intact, no focal deficits PSYCH:  Appropriate affect  Lab results: Results for orders placed during the hospital encounter of 06/22/11 (from the past 48 hour(s))  PROTIME-INR     Status: Normal   Collection Time   06/25/11  4:50 AM      Component Value Range Comment   Prothrombin Time 13.9  11.6 - 15.2 (seconds)    INR 1.05  0.00 - 1.49    CBC     Status: Normal   Collection Time   06/25/11  4:50 AM      Component Value Range Comment   WBC 4.8  4.0 - 10.5 (K/uL)    RBC 4.23  3.87 - 5.11 (MIL/uL)    Hemoglobin 13.0  12.0 - 15.0 (g/dL)    HCT 16.1  09.6 - 04.5 (%)     MCV 93.6  78.0 - 100.0 (fL)    MCH 30.7  26.0 - 34.0 (pg)    MCHC 32.8  30.0 - 36.0 (g/dL)    RDW 40.9  81.1 - 91.4 (%)    Platelets 248  150 - 400 (K/uL)   GLUCOSE, CAPILLARY     Status: Normal   Collection Time   06/25/11  7:45 AM      Component Value Range Comment   Glucose-Capillary 90  70 - 99 (mg/dL)   GLUCOSE, CAPILLARY     Status: Abnormal   Collection Time   06/25/11  4:42 PM      Component Value Range Comment   Glucose-Capillary 102 (*) 70 - 99 (mg/dL)   PROTIME-INR     Status: Abnormal   Collection Time   06/26/11  5:11 AM      Component Value Range Comment   Prothrombin Time 16.7 (*) 11.6 - 15.2 (seconds)    INR 1.33  0.00 - 1.49    MAGNESIUM     Status: Normal   Collection Time   06/26/11  5:11 AM      Component Value Range Comment   Magnesium 2.1  1.5 - 2.5 (mg/dL)   PHOSPHORUS     Status: Normal   Collection Time   06/26/11  5:11 AM      Component Value Range Comment   Phosphorus 3.6  2.3 - 4.6 (mg/dL)   COMPREHENSIVE METABOLIC PANEL     Status: Abnormal   Collection Time   06/26/11  5:11 AM      Component Value Range Comment   Sodium 136  135 - 145 (mEq/L)    Potassium 4.0  3.5 - 5.1 (mEq/L)    Chloride 103  96 - 112 (mEq/L)    CO2 27  19 - 32 (mEq/L)    Glucose, Bld 94  70 - 99 (mg/dL)    BUN 11  6 - 23 (mg/dL)    Creatinine, Ser 7.82  0.50 - 1.10 (mg/dL)    Calcium 9.0  8.4 - 10.5 (mg/dL)    Total Protein 6.8  6.0 - 8.3 (g/dL)    Albumin 3.5  3.5 - 5.2 (g/dL)    AST 19  0 - 37 (U/L)    ALT 19  0 - 35 (U/L)    Alkaline Phosphatase 53  39 - 117 (U/L)    Total Bilirubin 0.1 (*) 0.3 - 1.2 (mg/dL)    GFR calc non Af Amer 61 (*) >90 (  mL/min)    GFR calc Af Amer 71 (*) >90 (mL/min)   GLUCOSE, CAPILLARY     Status: Abnormal   Collection Time   06/26/11  7:47 AM      Component Value Range Comment   Glucose-Capillary 100 (*) 70 - 99 (mg/dL)    Comment 1 Notify RN      Comment 2 Documented in Chart     GLUCOSE, CAPILLARY     Status: Normal    Collection Time   06/26/11  5:00 PM      Component Value Range Comment   Glucose-Capillary 95  70 - 99 (mg/dL)    Comment 1 Notify RN      Comment 2 Documented in Chart       Diagnoses: Patient Active Problem List  Diagnoses  . Pulmonary embolism  . HTN (hypertension)  . DM (diabetes mellitus)  . Connective tissue disorder  . Constipation, acute   Assessment: 1. Pulmonary embolus. She has bilateral multiple pulmonary emboli by CT scan. 2. Hypercoagulable state.  3. Hypertension presently stable. 4. Chest pain secondary to #1.  Plan: 1. Continue her present regiment which includes Lovenox/Coumadin therapy (pharmacy is managing), oxygen therapy, pain management glucose management, Duonebs (PRN), IS usage 2. Followup her 2-D echo results 3. Continued management of DM and HTN (presently stable) 4. The patient will be given Senokot-S QHS for constipation  Gearald Stonebraker, NP-C   06/26/2011 5:55 PM

## 2011-06-27 LAB — GLUCOSE, CAPILLARY
Glucose-Capillary: 89 mg/dL (ref 70–99)
Glucose-Capillary: 106 mg/dL — ABNORMAL HIGH (ref 70–99)

## 2011-06-27 LAB — ANTITHROMBIN III: AntiThromb III Func: 109 % (ref 76–126)

## 2011-06-27 LAB — PROTIME-INR: Prothrombin Time: 21.4 seconds — ABNORMAL HIGH (ref 11.6–15.2)

## 2011-06-27 MED ORDER — WARFARIN SODIUM 10 MG PO TABS
10.0000 mg | ORAL_TABLET | Freq: Once | ORAL | Status: AC
Start: 2011-06-27 — End: 2011-06-27
  Administered 2011-06-27: 10 mg via ORAL
  Filled 2011-06-27: qty 1

## 2011-06-27 NOTE — Progress Notes (Signed)
Addendum: today is day #6 lovenox/coumadin overlap.

## 2011-06-27 NOTE — Plan of Care (Signed)
Problem: Discharge Progression Outcomes Goal: Flu vaccine received if indicated Outcome: Not Applicable Date Met:  06/27/11 Declined Flu vaccine

## 2011-06-27 NOTE — Progress Notes (Signed)
Subjective:  The patient reports that she's feeling better overall. She continues to have intermittent chest pain. She also has some resting dyspnea which is worse with activity. She's required some supplemental oxygen at times. There's been no nausea vomiting or palpitations. Patient is gradually ambulating more as well. No other new complaints.      No Known Allergies Current Facility-Administered Medications  Medication Dose Route Frequency Provider Last Rate Last Dose  . acetaminophen (TYLENOL) tablet 650 mg  650 mg Oral Q4H PRN Willey Blade, MD   650 mg at 06/26/11 0511  . ipratropium (ATROVENT) nebulizer solution 0.5 mg  0.5 mg Nebulization Q4H PRN Larina Bras, NP       And  . albuterol (PROVENTIL) (5 MG/ML) 0.5% nebulizer solution 2.5 mg  2.5 mg Nebulization Q4H PRN Larina Bras, NP      . cyclobenzaprine (FLEXERIL) tablet 5 mg  5 mg Oral QHS Larina Bras, NP   5 mg at 06/26/11 2146  . diltiazem (CARDIZEM CD) 24 hr capsule 240 mg  240 mg Oral Daily Larina Bras, NP   240 mg at 06/27/11 1025  . enoxaparin (LOVENOX) injection 70 mg  70 mg Subcutaneous Q12H Annia Belt, PHARMD   70 mg at 06/27/11 1025  . gabapentin (NEURONTIN) capsule 300 mg  300 mg Oral TID Larina Bras, NP   300 mg at 06/27/11 1025  . HYDROmorphone (DILAUDID) injection 1 mg  1 mg Intravenous Q3H PRN Willey Blade, MD   1 mg at 06/26/11 2147  . ibuprofen (ADVIL,MOTRIN) tablet 200 mg  200 mg Oral Q6H PRN Willey Blade, MD   200 mg at 06/24/11 1347  . losartan (COZAAR) tablet 50 mg  50 mg Oral Daily Larina Bras, NP   50 mg at 06/27/11 1024  . metFORMIN (GLUCOPHAGE) tablet 500 mg  500 mg Oral Q breakfast Larina Bras, NP   500 mg at 06/27/11 0800  . ondansetron (ZOFRAN) injection 4 mg  4 mg Intravenous Q6H PRN Larina Bras, NP      . senna-docusate (Senokot-S) tablet 2 tablet  2 tablet Oral QHS Larina Bras, NP   2 tablet at 06/26/11 2150  . sodium chloride 0.9 % injection 3 mL  3  mL Intravenous Q12H Willey Blade, MD   3 mL at 06/27/11 0800  . warfarin (COUMADIN) tablet 10 mg  10 mg Oral ONCE-1800 Willey Blade, MD   10 mg at 06/26/11 1756  . warfarin (COUMADIN) tablet 10 mg  10 mg Oral ONCE-1800 Willey Blade, MD      . warfarin (COUMADIN) video   Does not apply Once Annia Belt, PHARMD      . DISCONTD: senna (SENOKOT) tablet 17.2 mg  2 tablet Oral Daily PRN Willey Blade, MD   17.2 mg at 06/23/11 1739    Objective: Blood pressure 117/83, pulse 88, temperature 97.6 F (36.4 C), temperature source Oral, resp. rate 20, height 5\' 3"  (1.6 m), weight 158 lb 3.2 oz (71.759 kg), SpO2 99.00%.  Well-developed well-nourished black female in no acute distress. HEENT: No sinus tenderness. No posterior cervical nodes. NECK: No enlarged thyroid. LUNGS: Clear to auscultation. She has possible fremitus right midlung area. No wheezes. No CVA tenderness. CV: Normal S1, S2 without S3.  ABD: No tenderness. MSK: Negative Homans. No edema. NEURO: Intact. Nonfocal.  Lab results: Results for orders placed during the hospital encounter of 06/22/11 (from the past 48 hour(s))  GLUCOSE, CAPILLARY     Status: Abnormal   Collection Time  06/25/11  4:42 PM      Component Value Range Comment   Glucose-Capillary 102 (*) 70 - 99 (mg/dL)   PROTIME-INR     Status: Abnormal   Collection Time   06/26/11  5:11 AM      Component Value Range Comment   Prothrombin Time 16.7 (*) 11.6 - 15.2 (seconds)    INR 1.33  0.00 - 1.49    MAGNESIUM     Status: Normal   Collection Time   06/26/11  5:11 AM      Component Value Range Comment   Magnesium 2.1  1.5 - 2.5 (mg/dL)   PHOSPHORUS     Status: Normal   Collection Time   06/26/11  5:11 AM      Component Value Range Comment   Phosphorus 3.6  2.3 - 4.6 (mg/dL)   COMPREHENSIVE METABOLIC PANEL     Status: Abnormal   Collection Time   06/26/11  5:11 AM      Component Value Range Comment   Sodium 136  135 - 145 (mEq/L)    Potassium 4.0  3.5 - 5.1 (mEq/L)     Chloride 103  96 - 112 (mEq/L)    CO2 27  19 - 32 (mEq/L)    Glucose, Bld 94  70 - 99 (mg/dL)    BUN 11  6 - 23 (mg/dL)    Creatinine, Ser 8.11  0.50 - 1.10 (mg/dL)    Calcium 9.0  8.4 - 10.5 (mg/dL)    Total Protein 6.8  6.0 - 8.3 (g/dL)    Albumin 3.5  3.5 - 5.2 (g/dL)    AST 19  0 - 37 (U/L)    ALT 19  0 - 35 (U/L)    Alkaline Phosphatase 53  39 - 117 (U/L)    Total Bilirubin 0.1 (*) 0.3 - 1.2 (mg/dL)    GFR calc non Af Amer 61 (*) >90 (mL/min)    GFR calc Af Amer 71 (*) >90 (mL/min)   GLUCOSE, CAPILLARY     Status: Abnormal   Collection Time   06/26/11  7:47 AM      Component Value Range Comment   Glucose-Capillary 100 (*) 70 - 99 (mg/dL)    Comment 1 Notify RN      Comment 2 Documented in Chart     GLUCOSE, CAPILLARY     Status: Normal   Collection Time   06/26/11  5:00 PM      Component Value Range Comment   Glucose-Capillary 95  70 - 99 (mg/dL)    Comment 1 Notify RN      Comment 2 Documented in Chart     PROTIME-INR     Status: Abnormal   Collection Time   06/27/11  5:12 AM      Component Value Range Comment   Prothrombin Time 21.4 (*) 11.6 - 15.2 (seconds)    INR 1.82 (*) 0.00 - 1.49    GLUCOSE, CAPILLARY     Status: Normal   Collection Time   06/27/11  7:45 AM      Component Value Range Comment   Glucose-Capillary 89  70 - 99 (mg/dL)    Comment 1 Notify RN      Comment 2 Documented in Chart      @[email or web address] @  Patient Active Problem List  Diagnoses  . Pulmonary embolism  . HTN (hypertension)  . DM (diabetes mellitus)  . Connective tissue disorder  . Constipation, acute    Impression: 1. Pulmonary  embolus with gradually approaching therapeutic INR. 2. Hypertension stable. 3. Exertional dyspnea or chest pain secondary to #1. Rule out other. 2-D echo pending. 4. Diabetes mellitus. Mild.   Plan: 1. Continue her present regimen until INR greater than 2. 2. Followup results of 2-D echo. 3. Continues incentive spirometer with gradually increasing  activity as tolerated. 4. Anticipate discharge home soon.   August Saucer, Delio Slates 06/27/2011

## 2011-06-27 NOTE — Progress Notes (Signed)
ANTICOAGULATION CONSULT NOTE - Follow up Pharmacy Consult for Lovenox and Warfarin Indication: bilateral pulmonary embolus (new)  No Known Allergies  Patient Measurements: Height: 5\' 3"  (160 cm) Weight: 158 lb 3.2 oz (71.759 kg) IBW/kg (Calculated) : 52.4   Vital Signs: Temp: 97.6 F (36.4 C) (11/14 0603) Temp src: Oral (11/13 2159) BP: 117/83 mmHg (11/14 0603) Pulse Rate: 88  (11/14 0603)  Labs:  Basename 06/27/11 0512 06/26/11 0511 06/25/11 0450  HGB -- -- 13.0  HCT -- -- 39.6  PLT -- -- 248  APTT -- -- --  LABPROT 21.4* 16.7* 13.9  INR 1.82* 1.33 1.05  HEPARINUNFRC -- -- --  CREATININE -- 1.03 --  CKTOTAL -- -- --  CKMB -- -- --  TROPONINI -- -- --   Estimated Creatinine Clearance: 60 ml/min (by C-G formula based on Cr of 1.03).   Medications:  Scheduled:     . cyclobenzaprine  5 mg Oral QHS  . diltiazem  240 mg Oral Daily  . enoxaparin (LOVENOX) injection  70 mg Subcutaneous Q12H  . gabapentin  300 mg Oral TID  . losartan  50 mg Oral Daily  . metFORMIN  500 mg Oral Q breakfast  . senna-docusate  2 tablet Oral QHS  . sodium chloride  3 mL Intravenous Q12H  . warfarin  10 mg Oral ONCE-1800  . warfarin   Does not apply Once    Assessment: 55 yo F with new PE, PMH of GI bleed noted.  Had initially been using  more conservative dosing given hx of GIB.  PT/INR now responding after 5mg ,, 5mg , 7.5mg , 10mg , 10mg   Today is Day #5 of overlap.  Per CHEST Guidelines, will need Lovenox until INR > or = 2.0 for 2 consecutive days.  Goal of Therapy:  INR 2-3   Plan:  1) Continue Lovenox 70 mg SQ q12h - if plan is to discharge her on lovenox, she can be changed to 100mg  sq q24h (1.5mg /kg) for easier outpatient administration. 2) Warfarin 10mg  PO x1 at 18:00. 3) Pt education on warfarin completed by me on 11/12 5) Daily PT/INR. 6) CBC q72h per Lovenox protocol.  7) Monitor pt for S/Sx bleeding given hx of GIB.  Loralee Pacas R 06/27/2011,8:47 AM

## 2011-06-28 ENCOUNTER — Other Ambulatory Visit: Payer: Self-pay

## 2011-06-28 ENCOUNTER — Other Ambulatory Visit: Payer: Self-pay | Admitting: Internal Medicine

## 2011-06-28 DIAGNOSIS — R0609 Other forms of dyspnea: Secondary | ICD-10-CM | POA: Diagnosis present

## 2011-06-28 LAB — COMPREHENSIVE METABOLIC PANEL
AST: 56 U/L — ABNORMAL HIGH (ref 0–37)
BUN: 11 mg/dL (ref 6–23)
CO2: 26 mEq/L (ref 19–32)
Calcium: 9.2 mg/dL (ref 8.4–10.5)
Creatinine, Ser: 0.9 mg/dL (ref 0.50–1.10)
GFR calc Af Amer: 83 mL/min — ABNORMAL LOW (ref >90)
GFR calc non Af Amer: 72 mL/min — ABNORMAL LOW (ref >90)
Total Bilirubin: 0.1 mg/dL — ABNORMAL LOW (ref 0.3–1.2)

## 2011-06-28 LAB — CBC
HCT: 41 % (ref 36.0–46.0)
MCH: 30.8 pg (ref 26.0–34.0)
MCV: 93 fL (ref 78.0–100.0)
Platelets: 249 10*3/uL (ref 150–400)
RBC: 4.41 MIL/uL (ref 3.87–5.11)
RDW: 13.8 % (ref 11.5–15.5)

## 2011-06-28 LAB — GLUCOSE, CAPILLARY
Glucose-Capillary: 85 mg/dL (ref 70–99)
Glucose-Capillary: 89 mg/dL (ref 70–99)

## 2011-06-28 MED ORDER — FLUTICASONE PROPIONATE HFA 44 MCG/ACT IN AERO
2.0000 | INHALATION_SPRAY | Freq: Two times a day (BID) | RESPIRATORY_TRACT | Status: DC
  Administered 2011-06-28 – 2011-06-30 (×3): 2 via RESPIRATORY_TRACT
  Filled 2011-06-28: qty 10.6

## 2011-06-28 MED ORDER — WARFARIN SODIUM 10 MG PO TABS
10.0000 mg | ORAL_TABLET | Freq: Once | ORAL | Status: AC
Start: 2011-06-28 — End: 2011-06-28
  Administered 2011-06-28: 10 mg via ORAL
  Filled 2011-06-28: qty 1

## 2011-06-28 MED ORDER — LEVALBUTEROL HCL 0.63 MG/3ML IN NEBU
0.6300 mg | INHALATION_SOLUTION | Freq: Four times a day (QID) | RESPIRATORY_TRACT | Status: DC | PRN
  Administered 2011-06-29: 0.63 mg via RESPIRATORY_TRACT
  Filled 2011-06-28: qty 3

## 2011-06-28 NOTE — Progress Notes (Signed)
ANTICOAGULATION CONSULT NOTE - Follow up Pharmacy Consult for Lovenox and Warfarin Indication: bilateral pulmonary embolus (new)  No Known Allergies  Patient Measurements: Height: 5\' 3"  (160 cm) Weight: 157 lb 6.5 oz (71.4 kg) IBW/kg (Calculated) : 52.4   Vital Signs: Temp: 97.6 F (36.4 C) (11/15 0508) Temp src: Oral (11/15 0508) BP: 121/82 mmHg (11/15 0508) Pulse Rate: 83  (11/15 0508)  Labs:  Basename 06/28/11 0400 06/27/11 0512 06/26/11 0511  HGB 13.6 -- --  HCT 41.0 -- --  PLT 249 -- --  APTT -- -- --  LABPROT 24.2* 21.4* 16.7*  INR 2.13* 1.82* 1.33  HEPARINUNFRC -- -- --  CREATININE 0.90 -- 1.03  CKTOTAL -- -- --  CKMB -- -- --  TROPONINI -- -- --   Estimated Creatinine Clearance: 68.5 ml/min (by C-G formula based on Cr of 0.9).   Medications:  Scheduled:     . cyclobenzaprine  5 mg Oral QHS  . diltiazem  240 mg Oral Daily  . enoxaparin (LOVENOX) injection  70 mg Subcutaneous Q12H  . gabapentin  300 mg Oral TID  . losartan  50 mg Oral Daily  . metFORMIN  500 mg Oral Q breakfast  . senna-docusate  2 tablet Oral QHS  . sodium chloride  3 mL Intravenous Q12H  . warfarin  10 mg Oral ONCE-1800  . warfarin   Does not apply Once    Assessment: 55 yo F with new PE, PMH of GI bleed noted.  Had initially been using  more conservative dosing given hx of GIB.  PT/INR now therapeutic x 1 day after 5mg ,, 5mg , 7.5mg , 10mg , 10mg , 10mg   Today is Day #7 of overlap.  Per CHEST Guidelines, will need Lovenox until INR > or = 2.0 for 2 consecutive days.  Goal of Therapy:  INR 2-3   Plan:  1) Continue Lovenox 70 mg SQ q12h - if plan is to discharge her on lovenox, she can be changed to 100mg  sq q24h (1.5mg /kg) for easier outpatient administration.  Can d/c lovenox tomorrow if INR remains > or = 2.0 which it likely will be. 2) Warfarin 10mg  PO x1 at 18:00, an appropriate discharge dose at this point would be 10mg  daily. 3) Pt education on warfarin completed by me on  11/12 4) Daily PT/INR. 5) CBC q72h per Lovenox protocol.  6) Monitor pt for S/Sx bleeding given hx of GIB.  Loralee Pacas R 06/28/2011,8:16 AM

## 2011-06-28 NOTE — Progress Notes (Signed)
Ambulated after being  off o2 for 20 mins, sats 99-100% RA, HR 112, Tol well.

## 2011-06-28 NOTE — Progress Notes (Signed)
Subjective: The patient seen on rounds twice today.  During the am rounds the patient stated that she was feeling good enough to go home and was ready for discharge.  The nursing staff was asked to ambulate the patient without oxygen and record her O2 sats prior to her discharge.  During afternoon rounds the nurses stated to Dr. August Saucer and I that when the patient was ambulated in the hallway, her O2 sats remained in the 99% range,  however the patient became tachypnic and tachycardic.  Oxygen therapy was restarted at that time  The patient was also reporting midsternal chest pain rated as a 5/10 (dull)  two hours after her hall ambulation.  The patient denies any diaphoresis, HA, dizziness/lightheadedness or any other symptomatology. Dr. August Saucer and I discussed with the patient and she agreed that discharge will be held,  and further testing will commence.  Spoke to Dr. Sharyn Lull, Cardiologist about the patient.  A nuclear stress test was ordered and Dr. Sharyn Lull will oversee the administration and reading of the nuclear stress test results. 2-D echocardiogram was read by Dr. Sharyn Lull today and reviewed by Dr. August Saucer.   The patient also stated that her bowel movements are regular now.   No other patient or nursing concerns.     No Known Allergies Current Facility-Administered Medications  Medication Dose Route Frequency Provider Last Rate Last Dose  . acetaminophen (TYLENOL) tablet 650 mg  650 mg Oral Q4H PRN Willey Blade, MD   650 mg at 06/28/11 1204  . cyclobenzaprine (FLEXERIL) tablet 5 mg  5 mg Oral QHS Larina Bras, NP   5 mg at 06/27/11 2112  . diltiazem (CARDIZEM CD) 24 hr capsule 240 mg  240 mg Oral Daily Larina Bras, NP   240 mg at 06/28/11 0936  . enoxaparin (LOVENOX) injection 70 mg  70 mg Subcutaneous Q12H Annia Belt, PHARMD   70 mg at 06/28/11 9604  . fluticasone (FLOVENT HFA) 44 MCG/ACT inhaler 2 puff  2 puff Inhalation BID Larina Bras, NP      . gabapentin (NEURONTIN) capsule  300 mg  300 mg Oral TID Larina Bras, NP   300 mg at 06/28/11 1532  . HYDROmorphone (DILAUDID) injection 1 mg  1 mg Intravenous Q3H PRN Willey Blade, MD   1 mg at 06/26/11 2147  . ibuprofen (ADVIL,MOTRIN) tablet 200 mg  200 mg Oral Q6H PRN Willey Blade, MD   200 mg at 06/24/11 1347  . levalbuterol (XOPENEX) nebulizer solution 0.63 mg  0.63 mg Nebulization Q6H PRN Larina Bras, NP      . losartan (COZAAR) tablet 50 mg  50 mg Oral Daily Larina Bras, NP   50 mg at 06/28/11 0936  . metFORMIN (GLUCOPHAGE) tablet 500 mg  500 mg Oral Q breakfast Larina Bras, NP   500 mg at 06/28/11 0814  . ondansetron (ZOFRAN) injection 4 mg  4 mg Intravenous Q6H PRN Larina Bras, NP      . senna-docusate (Senokot-S) tablet 2 tablet  2 tablet Oral QHS Larina Bras, NP   2 tablet at 06/27/11 2113  . sodium chloride 0.9 % injection 3 mL  3 mL Intravenous Q12H Willey Blade, MD   3 mL at 06/28/11 0937  . warfarin (COUMADIN) tablet 10 mg  10 mg Oral ONCE-1800 Willey Blade, MD   10 mg at 06/27/11 1800  . warfarin (COUMADIN) tablet 10 mg  10 mg Oral ONCE-1800 Willey Blade, MD      . warfarin (COUMADIN) video  Does not apply Once Annia Belt, PHARMD      . DISCONTD: albuterol (PROVENTIL) (5 MG/ML) 0.5% nebulizer solution 2.5 mg  2.5 mg Nebulization Q4H PRN Larina Bras, NP      . DISCONTD: ipratropium (ATROVENT) nebulizer solution 0.5 mg  0.5 mg Nebulization Q4H PRN Larina Bras, NP        Objective: Blood pressure 119/80, pulse 94, temperature 98.6 F (37 C), temperature source Oral, resp. rate 20, height 5\' 3"  (1.6 m), weight 157 lb 6.5 oz (71.4 kg), SpO2 99.00%.  GENERAL:  Well-developed well-nourished black female presently in moderate distress HEENT: Atraumatic, normocephalic PERLA, EOMI, no icterus, no sinus tenderness, full dentition  NECK: Supple no enlarged thyroid. No posterior cervical nodes LUNGS: Clear to auscultation bilaterally, no CVA tenderness, diminished bibasilar  breath sounds L>R, dyspnea and midsternal CP on exertion, pain is reproducible with palpation, no vocal fremitus CV: Normal S1, S2, no murmurs, clicks, rubs or gallops ABD: Soft,  NTND,  Hypoactive bowel sounds MS: Negative Homans. No edema,  full ROM NEURO: Intact, Cranial nerves 2-12 intact, no focal deficits PSYCH:  Appropriate affect  Lab results: Results for orders placed during the hospital encounter of 06/22/11 (from the past 24 hour(s))  GLUCOSE, CAPILLARY     Status: Abnormal   Collection Time   06/27/11  4:53 PM      Component Value Range   Glucose-Capillary 106 (*) 70 - 99 (mg/dL)   Comment 1 Notify RN     Comment 2 Documented in Chart    PROTIME-INR     Status: Abnormal   Collection Time   06/28/11  4:00 AM      Component Value Range   Prothrombin Time 24.2 (*) 11.6 - 15.2 (seconds)   INR 2.13 (*) 0.00 - 1.49   CBC     Status: Normal   Collection Time   06/28/11  4:00 AM      Component Value Range   WBC 4.3  4.0 - 10.5 (K/uL)   RBC 4.41  3.87 - 5.11 (MIL/uL)   Hemoglobin 13.6  12.0 - 15.0 (g/dL)   HCT 16.1  09.6 - 04.5 (%)   MCV 93.0  78.0 - 100.0 (fL)   MCH 30.8  26.0 - 34.0 (pg)   MCHC 33.2  30.0 - 36.0 (g/dL)   RDW 40.9  81.1 - 91.4 (%)   Platelets 249  150 - 400 (K/uL)  COMPREHENSIVE METABOLIC PANEL     Status: Abnormal   Collection Time   06/28/11  4:00 AM      Component Value Range   Sodium 138  135 - 145 (mEq/L)   Potassium 4.2  3.5 - 5.1 (mEq/L)   Chloride 104  96 - 112 (mEq/L)   CO2 26  19 - 32 (mEq/L)   Glucose, Bld 82  70 - 99 (mg/dL)   BUN 11  6 - 23 (mg/dL)   Creatinine, Ser 7.82  0.50 - 1.10 (mg/dL)   Calcium 9.2  8.4 - 95.6 (mg/dL)   Total Protein 6.6  6.0 - 8.3 (g/dL)   Albumin 3.4 (*) 3.5 - 5.2 (g/dL)   AST 56 (*) 0 - 37 (U/L)   ALT 67 (*) 0 - 35 (U/L)   Alkaline Phosphatase 55  39 - 117 (U/L)   Total Bilirubin 0.1 (*) 0.3 - 1.2 (mg/dL)   GFR calc non Af Amer 72 (*) >90 (mL/min)   GFR calc Af Amer 83 (*) >90 (mL/min)  GLUCOSE,  CAPILLARY  Status: Normal   Collection Time   06/28/11  7:37 AM      Component Value Range   Glucose-Capillary 85  70 - 99 (mg/dL)     Diagnoses: Patient Active Problem List  Diagnoses  . Pulmonary embolism  . HTN (hypertension)  . DM (diabetes mellitus)  . Connective tissue disorder  . Constipation, acute  . Dyspnea on exertion   Assessment: 1. Pulmonary embolus (Bilateral multiple pulmonary emboli by CT scan) 2. Hypercoagulable state 3. Hypertension (stable) 4. Chest pain secondary to #1 5. Dyspnea on exertion  Plan: 1. Continue her present regiment which includes Lovenox/Coumadin therapy (pharmacy is managing), oxygen therapy, pain management, glucose management, IS usage.  PFT's, EKG and a nuclear stress test are all ordered.  PT has also been asked to evaluate and treat the patient for progressive ambulation as tolerated. 2. Continued management of DM and HTN (presently stable) 3. Continue Senokot-S QHS for constipation  Kassadee Carawan, NP-C   06/28/2011 3:37 PM

## 2011-06-29 ENCOUNTER — Inpatient Hospital Stay (HOSPITAL_COMMUNITY): Payer: BC Managed Care – PPO

## 2011-06-29 ENCOUNTER — Ambulatory Visit (HOSPITAL_COMMUNITY): Payer: BC Managed Care – PPO

## 2011-06-29 LAB — COMPREHENSIVE METABOLIC PANEL
AST: 69 U/L — ABNORMAL HIGH (ref 0–37)
Albumin: 3.4 g/dL — ABNORMAL LOW (ref 3.5–5.2)
Calcium: 9.3 mg/dL (ref 8.4–10.5)
Creatinine, Ser: 1.01 mg/dL (ref 0.50–1.10)

## 2011-06-29 LAB — PROTIME-INR
INR: 2.45 — ABNORMAL HIGH (ref 0.00–1.49)
Prothrombin Time: 27 seconds — ABNORMAL HIGH (ref 11.6–15.2)

## 2011-06-29 LAB — MAGNESIUM: Magnesium: 2.1 mg/dL (ref 1.5–2.5)

## 2011-06-29 LAB — PRO B NATRIURETIC PEPTIDE: Pro B Natriuretic peptide (BNP): 5 pg/mL (ref 0–125)

## 2011-06-29 LAB — CBC
Hemoglobin: 13.3 g/dL (ref 12.0–15.0)
MCH: 30.9 pg (ref 26.0–34.0)
MCV: 92.1 fL (ref 78.0–100.0)
RBC: 4.3 MIL/uL (ref 3.87–5.11)

## 2011-06-29 LAB — GLUCOSE, CAPILLARY
Glucose-Capillary: 84 mg/dL (ref 70–99)
Glucose-Capillary: 110 mg/dL — ABNORMAL HIGH (ref 70–99)

## 2011-06-29 MED ORDER — FLUTICASONE PROPIONATE HFA 44 MCG/ACT IN AERO: 2.0000 | INHALATION_SPRAY | Freq: Two times a day (BID) | RESPIRATORY_TRACT | Status: DC

## 2011-06-29 MED ORDER — TECHNETIUM TC 99M TETROFOSMIN IV KIT
10.0000 | PACK | Freq: Once | INTRAVENOUS | Status: AC | PRN
  Administered 2011-06-29: 10 via INTRAVENOUS

## 2011-06-29 MED ORDER — TECHNETIUM TC 99M TETROFOSMIN IV KIT
30.0000 | PACK | Freq: Once | INTRAVENOUS | Status: AC | PRN
  Administered 2011-06-29: 30 via INTRAVENOUS

## 2011-06-29 MED ORDER — REGADENOSON 0.4 MG/5ML IV SOLN
0.4000 mg | Freq: Once | INTRAVENOUS | Status: AC
  Administered 2011-06-29: 0.4 mg via INTRAVENOUS

## 2011-06-29 MED ORDER — WARFARIN SODIUM 7.5 MG PO TABS
7.5000 mg | ORAL_TABLET | Freq: Once | ORAL | Status: AC
  Administered 2011-06-29: 7.5 mg via ORAL
  Filled 2011-06-29: qty 1

## 2011-06-29 MED ORDER — WARFARIN SODIUM 7.5 MG PO TABS: 7.5000 mg | ORAL_TABLET | Freq: Every day | ORAL | Status: DC

## 2011-06-29 NOTE — Progress Notes (Signed)
Subjective: The patient seen on rounds late today as she was out of her room for diagnostic testing.  The patient was sitting in her bed comfortably visiting with two female visitors.   The patient continues to report midsternal chest pain rated as a 3/10 (dull).  The patient underwent PFT's, PT and a nuclear stress test today.  The pulmonary function test showed some restrictive pulmonary functioning.  The nuclear stress test did not produce any ischemia or show arrhythmia (see below).  PT stated that patient did not need any further inpatient PT.   The patient states that she is eager to get home.   No other patient or nursing concerns.    No Known Allergies Current Facility-Administered Medications  Medication Dose Route Frequency Provider Last Rate Last Dose  . acetaminophen (TYLENOL) tablet 650 mg  650 mg Oral Q4H PRN Willey Blade, MD   650 mg at 06/29/11 1659  . cyclobenzaprine (FLEXERIL) tablet 5 mg  5 mg Oral QHS Larina Bras, NP   5 mg at 06/28/11 2148  . diltiazem (CARDIZEM CD) 24 hr capsule 240 mg  240 mg Oral Daily Larina Bras, NP   240 mg at 06/29/11 0854  . fluticasone (FLOVENT HFA) 44 MCG/ACT inhaler 2 puff  2 puff Inhalation BID Larina Bras, NP   2 puff at 06/28/11 2228  . gabapentin (NEURONTIN) capsule 300 mg  300 mg Oral TID Larina Bras, NP   300 mg at 06/29/11 1655  . HYDROmorphone (DILAUDID) injection 1 mg  1 mg Intravenous Q3H PRN Willey Blade, MD   1 mg at 06/26/11 2147  . ibuprofen (ADVIL,MOTRIN) tablet 200 mg  200 mg Oral Q6H PRN Willey Blade, MD   200 mg at 06/24/11 1347  . levalbuterol (XOPENEX) nebulizer solution 0.63 mg  0.63 mg Nebulization Q6H PRN Larina Bras, NP   0.63 mg at 06/29/11 1606  . losartan (COZAAR) tablet 50 mg  50 mg Oral Daily Larina Bras, NP   50 mg at 06/29/11 0854  . metFORMIN (GLUCOPHAGE) tablet 500 mg  500 mg Oral Q breakfast Larina Bras, NP   500 mg at 06/29/11 1334  . ondansetron (ZOFRAN) injection 4 mg  4 mg  Intravenous Q6H PRN Larina Bras, NP      . regadenoson (LEXISCAN) injection SOLN 0.4 mg  0.4 mg Intravenous Once Robynn Pane, MD   0.4 mg at 06/29/11 1134  . senna-docusate (Senokot-S) tablet 2 tablet  2 tablet Oral QHS Larina Bras, NP   2 tablet at 06/28/11 2148  . sodium chloride 0.9 % injection 3 mL  3 mL Intravenous Q12H Willey Blade, MD   3 mL at 06/28/11 2149  . technetium tetrofosmin (TC-MYOVIEW) injection 10 milli Curie  10 milli Curie Intravenous Once PRN Medication Radiologist   10 milli Curie at 06/29/11 0825  . technetium tetrofosmin (TC-MYOVIEW) injection 30 milli Curie  30 milli Curie Intravenous Once PRN Medication Radiologist   30 milli Curie at 06/29/11 1030  . warfarin (COUMADIN) tablet 7.5 mg  7.5 mg Oral ONCE-1800 Willey Blade, MD   7.5 mg at 06/29/11 1655  . warfarin (COUMADIN) video   Does not apply Once Annia Belt, PHARMD      . DISCONTD: enoxaparin (LOVENOX) injection 70 mg  70 mg Subcutaneous Q12H Annia Belt, PHARMD   70 mg at 06/29/11 1333    Objective: Blood pressure 122/76, pulse 94, temperature 98.3 F (36.8 C), temperature source Oral, resp. rate 18, height 5\' 3"  (1.6 m),  weight 158 lb 4.6 oz (71.8 kg), SpO2 98.00%.  GENERAL:  Well-developed well-nourished black female presently in no visible distress HEENT: Atraumatic, normocephalic PERLA, EOMI, no icterus, no sinus tenderness, full dentition  NECK: Supple no enlarged thyroid. No posterior cervical nodes LUNGS: Clear to auscultation bilaterally, no CVA tenderness, diminished bibasilar breath sounds, dyspnea and midsternal CP on exertion, pain is not reproducible with palpation, no vocal fremitus CV: Normal S1, S2, no murmurs, clicks, rubs or gallops ABD: Soft,  NTND,  Hypoactive bowel sounds MS: Negative Homans. No edema,  full ROM NEURO: Intact, Cranial nerves 2-12 intact, no focal deficits PSYCH:  Appropriate affect  Lab results: Results for orders placed during the hospital  encounter of 06/22/11 (from the past 24 hour(s))  PROTIME-INR     Status: Abnormal   Collection Time   06/29/11  4:58 AM      Component Value Range   Prothrombin Time 27.0 (*) 11.6 - 15.2 (seconds)   INR 2.45 (*) 0.00 - 1.49   COMPREHENSIVE METABOLIC PANEL     Status: Abnormal   Collection Time   06/29/11  4:58 AM      Component Value Range   Sodium 138  135 - 145 (mEq/L)   Potassium 4.4  3.5 - 5.1 (mEq/L)   Chloride 104  96 - 112 (mEq/L)   CO2 27  19 - 32 (mEq/L)   Glucose, Bld 84  70 - 99 (mg/dL)   BUN 10  6 - 23 (mg/dL)   Creatinine, Ser 1.61  0.50 - 1.10 (mg/dL)   Calcium 9.3  8.4 - 09.6 (mg/dL)   Total Protein 6.6  6.0 - 8.3 (g/dL)   Albumin 3.4 (*) 3.5 - 5.2 (g/dL)   AST 69 (*) 0 - 37 (U/L)   ALT 93 (*) 0 - 35 (U/L)   Alkaline Phosphatase 54  39 - 117 (U/L)   Total Bilirubin 0.1 (*) 0.3 - 1.2 (mg/dL)   GFR calc non Af Amer 62 (*) >90 (mL/min)   GFR calc Af Amer 72 (*) >90 (mL/min)  CBC     Status: Normal   Collection Time   06/29/11  4:58 AM      Component Value Range   WBC 4.4  4.0 - 10.5 (K/uL)   RBC 4.30  3.87 - 5.11 (MIL/uL)   Hemoglobin 13.3  12.0 - 15.0 (g/dL)   HCT 04.5  40.9 - 81.1 (%)   MCV 92.1  78.0 - 100.0 (fL)   MCH 30.9  26.0 - 34.0 (pg)   MCHC 33.6  30.0 - 36.0 (g/dL)   RDW 91.4  78.2 - 95.6 (%)   Platelets 235  150 - 400 (K/uL)  MAGNESIUM     Status: Normal   Collection Time   06/29/11  4:58 AM      Component Value Range   Magnesium 2.1  1.5 - 2.5 (mg/dL)  PHOSPHORUS     Status: Normal   Collection Time   06/29/11  4:58 AM      Component Value Range   Phosphorus 3.7  2.3 - 4.6 (mg/dL)  PRO B NATRIURETIC PEPTIDE     Status: Normal   Collection Time   06/29/11  4:58 AM      Component Value Range   BNP, POC 5.0  0 - 125 (pg/mL)  GLUCOSE, CAPILLARY     Status: Normal   Collection Time   06/29/11  7:50 AM      Component Value Range   Glucose-Capillary 84  70 - 99 (mg/dL)   Comment 1 Notify RN    GLUCOSE, CAPILLARY     Status: Abnormal    Collection Time   06/29/11  2:55 PM      Component Value Range   Glucose-Capillary 110 (*) 70 - 99 (mg/dL)   Comment 1 Notify RN      Diagnostics: Nm Myocar Multi W/spect W/wall Motion / Ef  06/29/2011  *RADIOLOGY REPORT*  Clinical Data:  Midsternal pain.  Bilateral pulmonary emboli  MYOCARDIAL IMAGING WITH SPECT (REST AND PHARMACOLOGIC-STRESS) GATED LEFT VENTRICULAR WALL MOTION STUDY LEFT VENTRICULAR EJECTION FRACTION  Technique:  Resting myocardial SPECT imaging was initially performed after intravenous administration of radiopharmaceutical. Myocardial SPECT was subsequently performed after additional radiopharmaceutical injection during pharmacologic-stress supervised by the Cardiology staff.  Quantitative gated imaging was also performed to evaluate left ventricular wall motion, and estimate left ventricular ejection fraction.  Radiopharmaceutical:  Tc-87m Myoview at rest and during stress.  Comparison: none  Findings:  Technique: Study is adequate.  Perfusion:  There are no relative decreased counts on stress or rest to suggest reversible ischemia or infarction.  Wall motion:  No focal wall motion abnormality.  Normal contractility.  Left ventricular ejection fraction:  Calculated left ventricular ejection fraction =  greater than 70%  IMPRESSION:  1.  No reversible ischemia or infarction. 2.  Normal wall motion. 3.  Left ventricular ejection fraction equal greater than 70%  Original Report Authenticated By: Genevive Bi, M.D.   Pulmonary Functions Testing Results: Please see the patient's chart for the PFT results.  EKG Normal EKG, normal sinus rhythm.   Diagnoses: Patient Active Problem List  Diagnoses  . Pulmonary embolism  . HTN (hypertension)  . DM (diabetes mellitus)  . Connective tissue disorder  . Constipation, acute  . Dyspnea on exertion   Assessment: 1. Pulmonary embolus (Bilateral multiple pulmonary emboli by CT scan) 2. Hypercoagulable state 3.  Hypertension (stable) 4. Chest pain secondary to #1 5. Dyspnea on exertion  Plan: 1. Continue her present regiment which includes Coumadin therapy (pharmacy is managing), oxygen therapy, pain management, glucose management, IS usage. The patient will continue to ambulate in the hall (progressive ambulation) as tolerated. 2. Continued management of DM and HTN (presently stable) 3. Continue Senokot-S QHS for constipation  Taci Sterling, NP-C   06/29/2011 6:09 PM

## 2011-06-29 NOTE — Progress Notes (Signed)
Physical Therapy Evaluation Patient Details Name: [name] MRN: [id number] DOB: [birth year] Today's Date: 06/29/2011 1350-1420 Ev2 1Sc  Problem List:  Patient Active Problem List  Diagnoses  . Pulmonary embolism  . HTN (hypertension)  . DM (diabetes mellitus)  . Connective tissue disorder  . Constipation, acute  . Dyspnea on exertion    Past Medical History:  Past Medical History  Diagnosis Date  . Essential hypertension, malignant   . Obesity, unspecified   . Acute maxillary sinusitis   . Extrinsic asthma, unspecified   . Acute bronchitis   . Ischemic colitis, enteritis, or enterocolitis     Hisotry of  . Sinusitis   . Allergic rhinitis   . Type II or unspecified type diabetes mellitus without mention of complication, not stated as uncontrolled     oral meds-insulin resistance  . History of arteritis   . H/O: GI bleed   . Fibroids   . Dysmetabolic syndrome     History of  . Undifferentiated connective tissue disease     History of  . Otitis media of both ears    Past Surgical History:  Past Surgical History  Procedure Date  . Colonoscopy w/ biopsies   . Abdominal hysterectomy 2000  . Cardiac catheterization 09/25/05    PT Assessment/Plan/Recommendation PT Assessment Clinical Impression Statement: Patient admitted with pulmonary embolus presents with decreased activity tolerance.  No further needs for skilled PT in the acute venue.  May potentially benefit from outpatient PT if patient decides she needs it.  She will discuss with MD if needed.  Safe for D/C home with no supplemental O2 needs identified. PT Recommendation/Assessment: Patent does not need any further PT services No Skilled PT: All education completed;Patient is independent with all acitivity/mobility PT Goals     PT Evaluation Precautions/Restrictions    Prior Functioning  Home Living Lives With: Family;Daughter Type of Home: House Home Layout: One level;Other (Comment) (with  basement) Home Access: Stairs to enter Entrance Stairs-Rails: None Entrance Stairs-Number of Steps: 3 small steps Bathroom Shower/Tub: Health visitor: Standard Home Adaptive Equipment: Shower chair with back Prior Function Level of Independence: Independent with basic ADLs;Independent with transfers;Independent with homemaking with ambulation;Independent with gait Vocation: Full time employment Comments: 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 Cognition Cognition Arousal/Alertness: Awake/alert Overall Cognitive Status: Appears within functional limits for tasks assessed Sensation/Coordination Sensation Light Touch: Appears Intact Extremity Assessment RLE Assessment RLE Assessment: Within Functional Limits LLE Assessment LLE Assessment: Within Functional Limits Mobility (including Balance) Bed Mobility Supine to Sit: 7: Independent Sit to Supine - Left: 7: Independent Transfers Sit to Stand: 7: Independent Stand to Sit: 7: Independent Ambulation/Gait Ambulation/Gait Assistance: 7: Independent Ambulation Distance (Feet): 200 Feet (c/o SOB with SpO2 99% and HR 100 with ambulation) Stairs Assistance: 6: Modified independent (Device/Increase time) Stair Management Technique: One rail Right Number of Stairs: 3   Static Standing Balance Tandem Stance - Right Leg: 30  (did not test for longer) Rhomberg - Eyes Opened: 30  (did not test for longer) Exercise  Other Exercises Other Exercises: Educated on energy conservation with handout given Other Exercises: Educated on breathing exercises with verbal instructions, visual demonstration, and written handout. End of Session PT - End of Session Activity Tolerance: Patient tolerated treatment well Patient left: in bed;with call bell in reach General Behavior During Session: [facility] for tasks performed Cognition: [facility] for tasks performed  [facility] 06/29/2011, 2:53 PM

## 2011-06-29 NOTE — Progress Notes (Signed)
ANTICOAGULATION CONSULT NOTE - Follow up Pharmacy Consult for Lovenox and Warfarin Indication: bilateral pulmonary embolus (new)  No Known Allergies  Patient Measurements: Height: 5\' 3"  (160 cm) Weight: 158 lb 4.6 oz (71.8 kg) IBW/kg (Calculated) : 52.4   Vital Signs: Temp: 98.4 F (36.9 C) (11/16 0532) Temp src: Oral (11/16 0532) BP: 115/77 mmHg (11/16 0532) Pulse Rate: 83  (11/16 0532)  Labs:  Basename 06/29/11 0458 06/28/11 0400 06/27/11 0512  HGB 13.3 13.6 --  HCT 39.6 41.0 --  PLT 235 249 --  APTT -- -- --  LABPROT 27.0* 24.2* 21.4*  INR 2.45* 2.13* 1.82*  HEPARINUNFRC -- -- --  CREATININE 1.01 0.90 --  CKTOTAL -- -- --  CKMB -- -- --  TROPONINI -- -- --   Estimated Creatinine Clearance: 61.2 ml/min (by C-G formula based on Cr of 1.01).   Medications:  Scheduled:     . cyclobenzaprine  5 mg Oral QHS  . diltiazem  240 mg Oral Daily  . enoxaparin (LOVENOX) injection  70 mg Subcutaneous Q12H  . fluticasone  2 puff Inhalation BID  . gabapentin  300 mg Oral TID  . losartan  50 mg Oral Daily  . metFORMIN  500 mg Oral Q breakfast  . senna-docusate  2 tablet Oral QHS  . sodium chloride  3 mL Intravenous Q12H  . warfarin  10 mg Oral ONCE-1800  . warfarin   Does not apply Once    Assessment: 55 yo F with new PE, PMH of GI bleed noted.  Had initially been using  more conservative dosing given hx of GIB.  PT/INR now therapeutic x 2 day after 5mg ,, 5mg , 7.5mg , 10mg , 10mg , 10mg , 10mg   Today is Day #8 of overlap.  Has now completed 48hrs over lovenox overlap with therapeutic INR.  Goal of Therapy:  INR 2-3   Plan:  1) Can d/c lovenox today - await MD order to do so. 2) INR is rising steadily now, would back off on dose now to 7.5mg  3) If discharged today, suggest 7.5mg  daily and check INR early next week 4) Reinforce need to monitor pt for S/Sx bleeding given hx of GIB.  Loralee Pacas R 06/29/2011,9:42 AM

## 2011-06-30 LAB — COMPREHENSIVE METABOLIC PANEL
Alkaline Phosphatase: 62 U/L (ref 39–117)
BUN: 14 mg/dL (ref 6–23)
CO2: 27 mEq/L (ref 19–32)
GFR calc Af Amer: 64 mL/min — ABNORMAL LOW (ref >90)
GFR calc non Af Amer: 55 mL/min — ABNORMAL LOW (ref >90)
Glucose, Bld: 96 mg/dL (ref 70–99)
Potassium: 3.9 mEq/L (ref 3.5–5.1)
Total Protein: 6.4 g/dL (ref 6.0–8.3)

## 2011-06-30 LAB — CBC
HCT: 37.7 % (ref 36.0–46.0)
MCHC: 33.2 g/dL (ref 30.0–36.0)
Platelets: 237 10*3/uL (ref 150–400)
RDW: 14 % (ref 11.5–15.5)
WBC: 4.6 10*3/uL (ref 4.0–10.5)

## 2011-08-26 NOTE — Discharge Summary (Signed)
Physician Discharge Summary  Patient ID: [name] MRN: [id number] DOB/AGE: [birth year] 56 y.o.  Admit date: 06/22/2011 Discharge date: 06/30/2011   Discharge Diagnoses:  Principal Problem:  *Pulmonary embolism Active Problems:  HTN (hypertension)  DM (diabetes mellitus)  Connective tissue disorder  Constipation, acute  Dyspnea on exertion  Discharged Condition: improved.  Operations/Procedues: #1 CT scan of the lungs. #2nuclear medicine cardiac stress test.   Hospital Course: Patient is a 56 year old widowed black female admitted for further evaluation of increasing difficulty breathing. This had been symptomatic for the past 4 weeks prior to admission. Her pain has been at 7/10 the last 4 weeks as well. She recently went to an urgent care 4 weeks ago was told she had a slight fever but no other abnormality seen. Her symptoms had persisted. Patient recently had a d-dimer done as an outpatient was highly positive. CT scan of the chest confirmed bilateral pulmonary embolus. Patient was admitted for further therapy. She was started on lovenox and coumadin per pharmacy protocol. Venous dopplers were negative for DVT as well. Her ANA was noncontributory. Patient also required parenteral analgesia for severe pain.  Over the subsequent days she made slow but steady improvement. She also underwent a cardiac stress test to exclude coronary artery disease due to the nature of her chest pain. Eventually her coumadin dosing was made optimal. Patient's pain was manageable with oral medications. She was subsequently discharged home much improve.     Significant Diagnostic Studies:  Disposition: Home or Self Care  Discharge Orders    Future Orders Please Complete By Expires   Diet - low sodium heart healthy      Increase activity slowly      Scheduling Instructions:   May return to work in one week after discharge. Work light duty/half day for the first week as tolerated.   No wound  care      Call MD for:  severe uncontrolled pain      Comments:   Call for unusual and prolonged shortness of breath.   Discharge instructions      Scheduling Instructions:   Return to work one week after discharge. Work half day for the first week back.   Comments:   Follow up with primary care physician in 10 days.     Discharge Medication List as of 06/29/2011  8:21 PM    START taking these medications   Details  fluticasone (FLOVENT HFA) 44 MCG/ACT inhaler Inhale 2 puffs into the lungs 2 (two) times daily., Starting 06/29/2011, Until Sat 06/28/12, Print    warfarin (COUMADIN) 7.5 MG tablet Take 1 tablet (7.5 mg total) by mouth daily., Starting 06/29/2011, Until Sat 06/28/12, Print      CONTINUE these medications which have NOT CHANGED   Details  Biotin 1000 MCG tablet Take 1,000 mcg by mouth at bedtime. , Until Discontinued, Historical Med    cyclobenzaprine (FLEXERIL) 5 MG tablet Take 5 mg by mouth. , Until Discontinued, Historical Med    diltiazem (CARDIZEM CD) 240 MG 24 hr capsule Take 240 mg by mouth daily. , Until Discontinued, Historical Med    gabapentin (NEURONTIN) 300 MG capsule Take 300 mg by mouth 3 (three) times daily. , Until Discontinued, Historical Med    losartan (COZAAR) 50 MG tablet Take 50 mg by mouth daily. , Until Discontinued, Historical Med    metFORMIN (GLUCOPHAGE) 500 MG tablet Take 500 mg by mouth daily with breakfast.  , Until Discontinued, Historical Med    tetrahydrozoline-zinc (  VISINE-AC) 0.05-0.25 % ophthalmic solution Place 2 drops into both eyes 3 (three) times daily as needed. For itchy eyes , Until Discontinued, Historical Med      STOP taking these medications     aspirin EC 81 MG tablet        Follow-up Information    Follow up with August Saucer, Solash Tullo, MD in 10 days.   Contact information:   [address] [facility] [place] [address] [phone]          Signed: August Saucer, Dhriti Fales 08/26/2011,  2:56 PM

## 2011-08-28 ENCOUNTER — Other Ambulatory Visit: Payer: Self-pay | Admitting: Internal Medicine

## 2011-09-06 ENCOUNTER — Ambulatory Visit
Admission: RE | Admit: 2011-09-06 | Discharge: 2011-09-06 | Disposition: A | Discharge status: Home / Self Care | Payer: BC Managed Care – PPO | Source: Ambulatory Visit | Attending: Internal Medicine | Admitting: Internal Medicine

## 2011-10-10 ENCOUNTER — Other Ambulatory Visit: Payer: Self-pay | Admitting: Cardiology

## 2011-11-12 ENCOUNTER — Other Ambulatory Visit: Payer: Self-pay | Admitting: Cardiology

## 2011-12-10 ENCOUNTER — Ambulatory Visit
Admission: RE | Admit: 2011-12-10 | Discharge: 2011-12-10 | Disposition: A | Discharge status: Home / Self Care | Payer: BC Managed Care – PPO | Source: Ambulatory Visit | Attending: Internal Medicine | Admitting: Internal Medicine

## 2011-12-10 ENCOUNTER — Other Ambulatory Visit: Payer: Self-pay | Admitting: Internal Medicine

## 2011-12-10 MED ORDER — IOHEXOL 350 MG/ML SOLN
125.0000 mL | Freq: Once | INTRAVENOUS | Status: AC | PRN
  Administered 2011-12-10: 125 mL via INTRAVENOUS

## 2012-02-16 ENCOUNTER — Other Ambulatory Visit: Payer: Self-pay | Admitting: Cardiology

## 2012-03-13 ENCOUNTER — Other Ambulatory Visit: Payer: Self-pay | Admitting: Cardiology

## 2012-03-21 ENCOUNTER — Ambulatory Visit
Admission: RE | Admit: 2012-03-21 | Discharge: 2012-03-21 | Disposition: A | Discharge status: Home / Self Care | Payer: BC Managed Care – PPO | Source: Ambulatory Visit | Attending: Internal Medicine | Admitting: Internal Medicine

## 2012-03-21 ENCOUNTER — Other Ambulatory Visit: Payer: Self-pay | Admitting: Internal Medicine

## 2012-03-21 DIAGNOSIS — R05 Cough: Secondary | ICD-10-CM

## 2012-03-31 ENCOUNTER — Other Ambulatory Visit: Payer: Self-pay | Admitting: Internal Medicine

## 2012-03-31 DIAGNOSIS — Z1231 Encounter for screening mammogram for malignant neoplasm of breast: Secondary | ICD-10-CM

## 2012-04-30 ENCOUNTER — Ambulatory Visit
Admission: RE | Admit: 2012-04-30 | Discharge: 2012-04-30 | Disposition: A | Discharge status: Home / Self Care | Payer: BC Managed Care – PPO | Source: Ambulatory Visit | Attending: Internal Medicine | Admitting: Internal Medicine

## 2012-04-30 DIAGNOSIS — Z1231 Encounter for screening mammogram for malignant neoplasm of breast: Secondary | ICD-10-CM

## 2012-05-02 ENCOUNTER — Other Ambulatory Visit: Payer: Self-pay | Admitting: Internal Medicine

## 2012-05-02 DIAGNOSIS — R928 Other abnormal and inconclusive findings on diagnostic imaging of breast: Secondary | ICD-10-CM

## 2012-05-07 ENCOUNTER — Ambulatory Visit
Admission: RE | Admit: 2012-05-07 | Discharge: 2012-05-07 | Disposition: A | Discharge status: Home / Self Care | Payer: BC Managed Care – PPO | Source: Ambulatory Visit | Attending: Internal Medicine | Admitting: Internal Medicine

## 2012-05-07 DIAGNOSIS — R928 Other abnormal and inconclusive findings on diagnostic imaging of breast: Secondary | ICD-10-CM

## 2012-05-31 LAB — BASIC METABOLIC PANEL
Glucose: 92 mg/dL
Potassium: 4.7 mmol/L (ref 3.4–5.3)
Sodium: 141 mmol/L (ref 137–147)

## 2012-05-31 LAB — HEMOGLOBIN A1C: Hgb A1c MFr Bld: 6.5 % — AB (ref 4.0–6.0)

## 2012-05-31 LAB — LIPID PANEL: Cholesterol: 165 mg/dL (ref 0–200)

## 2012-08-25 ENCOUNTER — Telehealth (HOSPITAL_COMMUNITY): Payer: Self-pay | Admitting: Hematology

## 2012-09-19 LAB — POCT INR: INR: 3.6 — AB (ref 0.9–1.1)

## 2012-10-02 ENCOUNTER — Encounter: Payer: Self-pay | Admitting: *Deleted

## 2012-10-10 ENCOUNTER — Encounter: Payer: Self-pay | Admitting: Hematology

## 2012-10-21 ENCOUNTER — Other Ambulatory Visit: Payer: Self-pay | Admitting: Internal Medicine

## 2012-10-21 DIAGNOSIS — R319 Hematuria, unspecified: Secondary | ICD-10-CM

## 2012-10-21 DIAGNOSIS — R109 Unspecified abdominal pain: Secondary | ICD-10-CM

## 2012-10-28 ENCOUNTER — Ambulatory Visit
Admission: RE | Admit: 2012-10-28 | Discharge: 2012-10-28 | Disposition: A | Discharge status: Home / Self Care | Payer: BC Managed Care – PPO | Source: Ambulatory Visit | Attending: Internal Medicine | Admitting: Internal Medicine

## 2012-10-28 DIAGNOSIS — R319 Hematuria, unspecified: Secondary | ICD-10-CM

## 2012-10-28 DIAGNOSIS — R109 Unspecified abdominal pain: Secondary | ICD-10-CM

## 2012-10-28 MED ORDER — IOHEXOL 300 MG/ML  SOLN
100.0000 mL | Freq: Once | INTRAMUSCULAR | Status: AC | PRN
  Administered 2012-10-28: 100 mL via INTRAVENOUS

## 2012-12-11 ENCOUNTER — Other Ambulatory Visit: Payer: Self-pay | Admitting: Internal Medicine

## 2012-12-11 ENCOUNTER — Ambulatory Visit
Admission: RE | Admit: 2012-12-11 | Discharge: 2012-12-11 | Disposition: A | Discharge status: Home / Self Care | Payer: BC Managed Care – PPO | Source: Ambulatory Visit | Attending: Internal Medicine | Admitting: Internal Medicine

## 2012-12-11 DIAGNOSIS — R52 Pain, unspecified: Secondary | ICD-10-CM

## 2012-12-25 NOTE — Telephone Encounter (Signed)
See above note

## 2013-06-24 ENCOUNTER — Other Ambulatory Visit: Payer: Self-pay

## 2013-06-24 DIAGNOSIS — Z1231 Encounter for screening mammogram for malignant neoplasm of breast: Secondary | ICD-10-CM

## 2013-07-23 ENCOUNTER — Ambulatory Visit
Admission: RE | Admit: 2013-07-23 | Discharge: 2013-07-23 | Disposition: A | Discharge status: Home / Self Care | Payer: BC Managed Care – PPO | Source: Ambulatory Visit

## 2013-07-23 DIAGNOSIS — Z1231 Encounter for screening mammogram for malignant neoplasm of breast: Secondary | ICD-10-CM

## 2014-06-21 ENCOUNTER — Other Ambulatory Visit: Payer: Self-pay

## 2014-06-21 DIAGNOSIS — Z1231 Encounter for screening mammogram for malignant neoplasm of breast: Secondary | ICD-10-CM

## 2014-07-29 ENCOUNTER — Ambulatory Visit
Admission: RE | Admit: 2014-07-29 | Discharge: 2014-07-29 | Disposition: A | Discharge status: Home / Self Care | Payer: BC Managed Care – PPO | Source: Ambulatory Visit

## 2014-07-29 DIAGNOSIS — Z1231 Encounter for screening mammogram for malignant neoplasm of breast: Secondary | ICD-10-CM

## 2014-08-02 ENCOUNTER — Other Ambulatory Visit: Payer: Self-pay | Admitting: Internal Medicine

## 2014-08-02 DIAGNOSIS — R928 Other abnormal and inconclusive findings on diagnostic imaging of breast: Secondary | ICD-10-CM

## 2014-08-18 ENCOUNTER — Ambulatory Visit
Admission: RE | Admit: 2014-08-18 | Discharge: 2014-08-18 | Disposition: A | Discharge status: Home / Self Care | Payer: BC Managed Care – PPO | Source: Ambulatory Visit | Attending: Internal Medicine | Admitting: Internal Medicine

## 2014-08-18 ENCOUNTER — Other Ambulatory Visit: Payer: Self-pay | Admitting: Internal Medicine

## 2014-08-18 DIAGNOSIS — R928 Other abnormal and inconclusive findings on diagnostic imaging of breast: Secondary | ICD-10-CM

## 2014-12-10 ENCOUNTER — Encounter (HOSPITAL_COMMUNITY): Payer: Self-pay

## 2014-12-10 ENCOUNTER — Emergency Department (HOSPITAL_COMMUNITY)
Admission: EM | Admit: 2014-12-10 | Discharge: 2014-12-10 | Disposition: A | Discharge status: Home / Self Care | Payer: BC Managed Care – PPO | Attending: Emergency Medicine | Admitting: Emergency Medicine

## 2014-12-10 DIAGNOSIS — E669 Obesity, unspecified: Secondary | ICD-10-CM | POA: Diagnosis not present

## 2014-12-10 DIAGNOSIS — J45909 Unspecified asthma, uncomplicated: Secondary | ICD-10-CM | POA: Insufficient documentation

## 2014-12-10 DIAGNOSIS — Z8742 Personal history of other diseases of the female genital tract: Secondary | ICD-10-CM | POA: Insufficient documentation

## 2014-12-10 DIAGNOSIS — Z8739 Personal history of other diseases of the musculoskeletal system and connective tissue: Secondary | ICD-10-CM | POA: Insufficient documentation

## 2014-12-10 DIAGNOSIS — I10 Essential (primary) hypertension: Secondary | ICD-10-CM | POA: Insufficient documentation

## 2014-12-10 DIAGNOSIS — E119 Type 2 diabetes mellitus without complications: Secondary | ICD-10-CM | POA: Insufficient documentation

## 2014-12-10 DIAGNOSIS — R112 Nausea with vomiting, unspecified: Secondary | ICD-10-CM | POA: Diagnosis present

## 2014-12-10 DIAGNOSIS — Z7982 Long term (current) use of aspirin: Secondary | ICD-10-CM | POA: Insufficient documentation

## 2014-12-10 DIAGNOSIS — Z8669 Personal history of other diseases of the nervous system and sense organs: Secondary | ICD-10-CM | POA: Insufficient documentation

## 2014-12-10 DIAGNOSIS — Z79899 Other long term (current) drug therapy: Secondary | ICD-10-CM | POA: Insufficient documentation

## 2014-12-10 DIAGNOSIS — K529 Noninfective gastroenteritis and colitis, unspecified: Secondary | ICD-10-CM | POA: Diagnosis not present

## 2014-12-10 LAB — CBC WITH DIFFERENTIAL/PLATELET
Basophils Absolute: 0 10*3/uL (ref 0.0–0.1)
Basophils Relative: 1 % (ref 0–1)
EOS ABS: 0.2 10*3/uL (ref 0.0–0.7)
Eosinophils Relative: 3 % (ref 0–5)
HEMATOCRIT: 42.3 % (ref 36.0–46.0)
Hemoglobin: 14.5 g/dL (ref 12.0–15.0)
LYMPHS ABS: 3.1 10*3/uL (ref 0.7–4.0)
Lymphocytes Relative: 45 % (ref 12–46)
MCH: 30.3 pg (ref 26.0–34.0)
MCHC: 34.3 g/dL (ref 30.0–36.0)
MCV: 88.5 fL (ref 78.0–100.0)
Monocytes Absolute: 0.4 10*3/uL (ref 0.1–1.0)
Monocytes Relative: 6 % (ref 3–12)
Neutro Abs: 3 10*3/uL (ref 1.7–7.7)
Neutrophils Relative %: 45 % (ref 43–77)
Platelets: 380 10*3/uL (ref 150–400)
RBC: 4.78 MIL/uL (ref 3.87–5.11)
RDW: 13 % (ref 11.5–15.5)
WBC: 6.8 10*3/uL (ref 4.0–10.5)

## 2014-12-10 LAB — LIPASE, BLOOD: LIPASE: 73 U/L — AB (ref 11–59)

## 2014-12-10 LAB — COMPREHENSIVE METABOLIC PANEL
ALT: 20 U/L (ref 0–35)
AST: 22 U/L (ref 0–37)
Albumin: 4.4 g/dL (ref 3.5–5.2)
Alkaline Phosphatase: 78 U/L (ref 39–117)
Anion gap: 11 (ref 5–15)
BUN: 16 mg/dL (ref 6–23)
CO2: 25 mmol/L (ref 19–32)
Calcium: 10.2 mg/dL (ref 8.4–10.5)
Chloride: 105 mmol/L (ref 96–112)
Creatinine, Ser: 1.17 mg/dL — ABNORMAL HIGH (ref 0.50–1.10)
GFR calc Af Amer: 59 mL/min — ABNORMAL LOW (ref >90)
GFR calc non Af Amer: 51 mL/min — ABNORMAL LOW (ref >90)
Glucose, Bld: 96 mg/dL (ref 70–99)
Potassium: 4.1 mmol/L (ref 3.5–5.1)
SODIUM: 141 mmol/L (ref 135–145)
Total Bilirubin: 0.8 mg/dL (ref 0.3–1.2)
Total Protein: 8 g/dL (ref 6.0–8.3)

## 2014-12-10 MED ORDER — KETOROLAC TROMETHAMINE 30 MG/ML IJ SOLN
30.0000 mg | Freq: Once | INTRAMUSCULAR | Status: AC
  Administered 2014-12-10: 30 mg via INTRAVENOUS
  Filled 2014-12-10: qty 1

## 2014-12-10 MED ORDER — SODIUM CHLORIDE 0.9 % IV BOLUS (SEPSIS)
1000.0000 mL | Freq: Once | INTRAVENOUS | Status: AC
  Administered 2014-12-10: 1000 mL via INTRAVENOUS

## 2014-12-10 MED ORDER — ONDANSETRON 8 MG PO TBDP: ORAL_TABLET | ORAL | Status: DC

## 2014-12-10 MED ORDER — ONDANSETRON HCL 4 MG/2ML IJ SOLN
4.0000 mg | Freq: Once | INTRAMUSCULAR | Status: AC
  Administered 2014-12-10: 4 mg via INTRAVENOUS
  Filled 2014-12-10: qty 2

## 2014-12-10 NOTE — ED Notes (Signed)
Pt. Having n/v/d ,  Symptoms began after taking zithromycin  Which he placed her on that antibiotic for rt. Facial numbness and possible infection.  Pt.only has abdominal cramping when she goes to the bathroom,.  GCS 15, Skin is warm and dry. Pt. Denies any chest pain or sob.

## 2014-12-10 NOTE — ED Notes (Signed)
Pt.  Does not want any nausea medication at the present time

## 2014-12-10 NOTE — ED Provider Notes (Signed)
CSN: [id number]     Arrival date & time 12/10/14  1737 History   First MD Initiated Contact with Patient 12/10/14 1904     Chief Complaint  Patient presents with  . Emesis    nausea and loose stools     (Consider location/radiation/quality/duration/timing/severity/associated sxs/prior Treatment) HPI Comments: Patient is a 59 year old female with history of hypertension, type 2 diabetes, and asthma. She presents for evaluation of nausea, vomiting, diarrhea that has been worsening over the past 2 days. All has been nonbloody and nonbilious. She denies any fevers or chills. She does admit to some generalized abdominal discomfort, however denies localized pain.  Patient is a 59 y.o. female presenting with vomiting. The history is provided by the patient.  Emesis Severity:  Moderate Duration:  2 days Timing:  Constant Progression:  Worsening Chronicity:  New Recent urination:  Decreased Relieved by:  Nothing Worsened by:  Nothing tried Ineffective treatments:  None tried Associated symptoms: chills and diarrhea   Associated symptoms: no abdominal pain and no fever     Past Medical History  Diagnosis Date  . Essential hypertension, malignant   . Obesity, unspecified   . Acute maxillary sinusitis   . Extrinsic asthma, unspecified   . Acute bronchitis   . Ischemic colitis, enteritis, or enterocolitis     Hisotry of  . Sinusitis   . Allergic rhinitis   . Type II or unspecified type diabetes mellitus without mention of complication, not stated as uncontrolled     oral meds-insulin resistance  . History of arteritis   . H/O: GI bleed   . Fibroids   . Dysmetabolic syndrome     History of  . Undifferentiated connective tissue disease     History of  . Otitis media of both ears    Past Surgical History  Procedure Laterality Date  . Colonoscopy w/ biopsies    . Abdominal hysterectomy  2000  . Cardiac catheterization  09/25/05   Family History  Problem Relation Age of Onset   . Kidney disease Mother    History  Substance Use Topics  . Smoking status: Never Smoker   . Smokeless tobacco: Never Used  . Alcohol Use: No   OB History    No data available     Review of Systems  Constitutional: Positive for chills.  Gastrointestinal: Positive for vomiting and diarrhea. Negative for abdominal pain.  All other systems reviewed and are negative.     Allergies  Review of patient's allergies indicates no known allergies.  Home Medications   Prior to Admission medications   Medication Sig Start Date End Date Taking? Authorizing Provider  aspirin 325 MG tablet Take 325 mg by mouth daily.   Yes Historical Provider, MD  Biotin 1000 MCG tablet Take 1,000 mcg by mouth at bedtime.    Yes Historical Provider, MD  cholecalciferol (VITAMIN D) 1000 UNITS tablet Take 1,000 Units by mouth daily.   Yes Historical Provider, MD  cyclobenzaprine (FLEXERIL) 5 MG tablet Take 5 mg by mouth 2 (two) times daily as needed for muscle spasms.    Yes Historical Provider, MD  dextromethorphan (DELSYM) 30 MG/5ML liquid Take 30 mg by mouth as needed for cough.   Yes Historical Provider, MD  diltiazem (CARDIZEM CD) 360 MG 24 hr capsule Take 360 mg by mouth daily. 11/27/14  Yes Historical Provider, MD  fluticasone (FLOVENT HFA) 44 MCG/ACT inhaler Inhale 2 puffs into the lungs 2 (two) times daily. 06/29/11 12/10/14 Yes Rogers Blocker, MD  gabapentin (NEURONTIN) 300 MG capsule Take 300 mg by mouth at bedtime.    Yes Historical Provider, MD  losartan-hydrochlorothiazide (HYZAAR) 100-12.5 MG per tablet Take 1 tablet by mouth every evening. 11/17/14  Yes Historical Provider, MD  metFORMIN (GLUCOPHAGE) 500 MG tablet Take 500 mg by mouth every evening.    Yes Historical Provider, MD  mometasone (ELOCON) 0.1 % cream Apply 1 application topically daily.   Yes Historical Provider, MD  montelukast (SINGULAIR) 10 MG tablet Take 10 mg by mouth daily. 11/10/14  Yes Historical Provider, MD  promethazine  (PHENERGAN) 25 MG tablet Take 25 mg by mouth as needed. For nausea/vomiting 12/08/14  Yes Historical Provider, MD  tetrahydrozoline-zinc (VISINE-AC) 0.05-0.25 % ophthalmic solution Place 2 drops into both eyes 3 (three) times daily as needed. For itchy eyes    Yes Historical Provider, MD  XOPENEX HFA 45 MCG/ACT inhaler Inhale 1 puff into the lungs 2 (two) times daily. 12/06/14  Yes Historical Provider, MD  Azelastine HCl 0.15 % SOLN Place 2 sprays into both nostrils as needed. For allergies 12/06/14   Historical Provider, MD  azithromycin (ZITHROMAX) 250 MG tablet Take 250 mg by mouth daily. 12/06/14   Historical Provider, MD  fluticasone (FLONASE) 50 MCG/ACT nasal spray Place 2 sprays into both nostrils daily as needed. 12/06/14   Historical Provider, MD  warfarin (COUMADIN) 7.5 MG tablet Take 1 tablet (7.5 mg total) by mouth daily. 06/29/11 06/28/12  Rogers Blocker, MD   BP 155/99 mmHg  Pulse 103  Temp(Src) 98.4 F (36.9 C)  Resp 20  SpO2 99% Physical Exam  Constitutional: She is oriented to person, place, and time. She appears well-developed and well-nourished. No distress.  HENT:  Head: Normocephalic and atraumatic.  Neck: Normal range of motion. Neck supple.  Cardiovascular: Normal rate and regular rhythm.  Exam reveals no gallop and no friction rub.   No murmur heard. Pulmonary/Chest: Effort normal and breath sounds normal. No respiratory distress. She has no wheezes.  Abdominal: Soft. Bowel sounds are normal. She exhibits no distension. There is no tenderness.  Musculoskeletal: Normal range of motion.  Neurological: She is alert and oriented to person, place, and time.  Skin: Skin is warm and dry. She is not diaphoretic.  Nursing note and vitals reviewed.   ED Course  Procedures (including critical care time) Labs Review Labs Reviewed  CBC WITH DIFFERENTIAL/PLATELET  COMPREHENSIVE METABOLIC PANEL  LIPASE, BLOOD    Imaging Review No results found.   EKG Interpretation None       MDM   Final diagnoses:  None    Patient presents with nausea, vomiting, diarrhea for the past 2 days. This started after 1 dose of Zithromax which she was prescribed for a sinus infection. She has been on no other antibiotics and I doubt this is related to C. difficile. I suspect a viral etiology. Her electrolytes and laboratory studies are unremarkable and she is feeling better with fluids and medications in the ER. She will be discharged to home with Zofran which she can take as needed for nausea and when necessary return.    Veryl Speak, MD 12/10/14 2045

## 2014-12-10 NOTE — Discharge Instructions (Signed)
Zofran as prescribed as needed for nausea.  Return to the emergency department if you develop severe abdominal pain, bloody stool, or other new and concerning symptoms.   Viral Gastroenteritis Viral gastroenteritis is also known as stomach flu. This condition affects the stomach and intestinal tract. It can cause sudden diarrhea and vomiting. The illness typically lasts 3 to 8 days. Most people develop an immune response that eventually gets rid of the virus. While this natural response develops, the virus can make you quite ill. CAUSES  Many different viruses can cause gastroenteritis, such as rotavirus or noroviruses. You can catch one of these viruses by consuming contaminated food or water. You may also catch a virus by sharing utensils or other personal items with an infected person or by touching a contaminated surface. SYMPTOMS  The most common symptoms are diarrhea and vomiting. These problems can cause a severe loss of body fluids (dehydration) and a body salt (electrolyte) imbalance. Other symptoms may include:  Fever.  Headache.  Fatigue.  Abdominal pain. DIAGNOSIS  Your caregiver can usually diagnose viral gastroenteritis based on your symptoms and a physical exam. A stool sample may also be taken to test for the presence of viruses or other infections. TREATMENT  This illness typically goes away on its own. Treatments are aimed at rehydration. The most serious cases of viral gastroenteritis involve vomiting so severely that you are not able to keep fluids down. In these cases, fluids must be given through an intravenous line (IV). HOME CARE INSTRUCTIONS   Drink enough fluids to keep your urine clear or pale yellow. Drink small amounts of fluids frequently and increase the amounts as tolerated.  Ask your caregiver for specific rehydration instructions.  Avoid:  Foods high in sugar.  Alcohol.  Carbonated drinks.  Tobacco.  Juice.  Caffeine drinks.  Extremely hot  or cold fluids.  Fatty, greasy foods.  Too much intake of anything at one time.  Dairy products until 24 to 48 hours after diarrhea stops.  You may consume probiotics. Probiotics are active cultures of beneficial bacteria. They may lessen the amount and number of diarrheal stools in adults. Probiotics can be found in yogurt with active cultures and in supplements.  Wash your hands well to avoid spreading the virus.  Only take over-the-counter or prescription medicines for pain, discomfort, or fever as directed by your caregiver. Do not give aspirin to children. Antidiarrheal medicines are not recommended.  Ask your caregiver if you should continue to take your regular prescribed and over-the-counter medicines.  Keep all follow-up appointments as directed by your caregiver. SEEK IMMEDIATE MEDICAL CARE IF:   You are unable to keep fluids down.  You do not urinate at least once every 6 to 8 hours.  You develop shortness of breath.  You notice blood in your stool or vomit. This may look like coffee grounds.  You have abdominal pain that increases or is concentrated in one small area (localized).  You have persistent vomiting or diarrhea.  You have a fever.  The patient is a child younger than 3 months, and he or she has a fever.  The patient is a child older than 3 months, and he or she has a fever and persistent symptoms.  The patient is a child older than 3 months, and he or she has a fever and symptoms suddenly get worse.  The patient is a baby, and he or she has no tears when crying. MAKE SURE YOU:   Understand these instructions.  Will watch your condition.  Will get help right away if you are not doing well or get worse. Document Released: 07/30/2005 Document Revised: 10/22/2011 Document Reviewed: 05/16/2011 [facility] Patient Information 2015 [place], [place]. This information is not intended to replace advice given to you by your health care provider. Make sure you  discuss any questions you have with your health care provider.

## 2014-12-10 NOTE — ED Notes (Signed)
Pt is coming from Dr. Marlou Sa for n/v/d after taking azithromycin. Concern for dehydration also has right facial numbness since yesterday per MD.

## 2015-01-29 ENCOUNTER — Ambulatory Visit (INDEPENDENT_AMBULATORY_CARE_PROVIDER_SITE_OTHER): Payer: BC Managed Care – PPO | Admitting: Family Medicine

## 2015-01-29 DIAGNOSIS — Z23 Encounter for immunization: Secondary | ICD-10-CM

## 2015-01-29 DIAGNOSIS — S01511A Laceration without foreign body of lip, initial encounter: Secondary | ICD-10-CM | POA: Diagnosis not present

## 2015-01-29 NOTE — Patient Instructions (Signed)
We put in 3 dissolvable sutures.  These should dissolve over next couple weeks.  Be sure to come back to see [place] with any signs infection as below.   Open Wound, Lip An open wound is a break in the skin caused by an injury. An open wound to the lip can be a scrape, cut, or hole (puncture). Good wound care will help:   Lessen pain.  Prevent infection.  Lessen scarring. HOME CARE  Wash off all dirt.  Clean your wounds daily with gentle soap and water.  Eat soft foods or liquids while your wound is healing.  Rinse the wound with salt water after each meal.  Apply medicated cream after the wound has been cleaned as told by your doctor.  Follow up with your doctor as told. GET HELP RIGHT AWAY IF:   There is more redness or puffiness (swelling) in or around the wound.  There is increasing pain.  You have a temperature by mouth above 102 F (38.9 C), not controlled by medicine.  Your baby is older than 3 months with a rectal temperature of 102 F (38.9 C) or higher.  Your baby is 57 months old or younger with a rectal temperature of 100.4 F (38 C) or higher.  There is yellowish white fluid (pus) coming from the wound.  Very bad pain develops that is not controlled with medicine.  There is a red line on the skin above or below the wound. MAKE SURE YOU:   Understand these instructions.  Will watch this condition.  Will get help right away if you are not doing well or get worse. Document Released: 10/26/2008 Document Revised: 10/22/2011 Document Reviewed: 11/15/2009 [facility] Patient Information 2015 [place], [place]. This information is not intended to replace advice given to you by your health care provider. Make sure you discuss any questions you have with your health care provider.

## 2015-01-29 NOTE — Progress Notes (Signed)
 Chief Complaint:  Chief Complaint  Patient presents with  . Lip Laceration    Today    HPI: [name] is a 59 y.o. female who is here for inner submucosal bottom lip laceration today, she is diabetic, no utd on TDap , will not stop bleeding. DM well controlled.  She was trimming bushes with clippers and lost her grip and it fell back on her face and hit her in the lip. She has had no confusion, has a slight HA where it hit her in the face, but no other neuro sxs.   Past Medical History  Diagnosis Date  . Essential hypertension, malignant   . Obesity, unspecified   . Acute maxillary sinusitis   . Extrinsic asthma, unspecified   . Acute bronchitis   . Ischemic colitis, enteritis, or enterocolitis     Hisotry of  . Sinusitis   . Allergic rhinitis   . Type II or unspecified type diabetes mellitus without mention of complication, not stated as uncontrolled     oral meds-insulin resistance  . History of arteritis   . H/O: GI bleed   . Fibroids   . Dysmetabolic syndrome     History of  . Undifferentiated connective tissue disease     History of  . Otitis media of both ears    Past Surgical History  Procedure Laterality Date  . Colonoscopy w/ biopsies    . Abdominal hysterectomy  2000  . Cardiac catheterization  09/25/05   History   Social History  . Marital Status: Widowed    Spouse Name: N/A  . Number of Children: N/A  . Years of Education: N/A   Social History Main Topics  . Smoking status: Never Smoker   . Smokeless tobacco: Never Used  . Alcohol Use: No  . Drug Use: No  . Sexual Activity: No   Other Topics Concern  . None   Social History Narrative   Family History  Problem Relation Age of Onset  . Kidney disease Mother    No Known Allergies Prior to Admission medications   Medication Sig Start Date End Date Taking? Authorizing Provider  aspirin 325 MG tablet Take 325 mg by mouth daily.   Yes Historical Provider, MD  Azelastine HCl 0.15 %  SOLN Place 2 sprays into both nostrils as needed. For allergies 12/06/14  Yes Historical Provider, MD  Biotin 1000 MCG tablet Take 1,000 mcg by mouth at bedtime.    Yes Historical Provider, MD  cholecalciferol (VITAMIN D) 1000 UNITS tablet Take 1,000 Units by mouth daily.   Yes Historical Provider, MD  cyclobenzaprine (FLEXERIL) 5 MG tablet Take 5 mg by mouth 2 (two) times daily as needed for muscle spasms.    Yes Historical Provider, MD  diltiazem (CARDIZEM CD) 360 MG 24 hr capsule Take 360 mg by mouth daily. 11/27/14  Yes Historical Provider, MD  gabapentin (NEURONTIN) 300 MG capsule Take 300 mg by mouth at bedtime.    Yes Historical Provider, MD  losartan-hydrochlorothiazide (HYZAAR) 100-12.5 MG per tablet Take 1 tablet by mouth every evening. 11/17/14  Yes Historical Provider, MD  metFORMIN (GLUCOPHAGE) 500 MG tablet Take 500 mg by mouth every evening.    Yes Historical Provider, MD  montelukast (SINGULAIR) 10 MG tablet Take 10 mg by mouth daily. 11/10/14  Yes Historical Provider, MD  XOPENEX HFA 45 MCG/ACT inhaler Inhale 1 puff into the lungs 2 (two) times daily. 12/06/14  Yes Historical Provider, MD  fluticasone (  FLONASE) 50 MCG/ACT nasal spray Place 2 sprays into both nostrils daily as needed. 12/06/14   Historical Provider, MD  fluticasone (FLOVENT HFA) 44 MCG/ACT inhaler Inhale 2 puffs into the lungs 2 (two) times daily. 06/29/11 12/10/14  Rogers Blocker, MD  mometasone (ELOCON) 0.1 % cream Apply 1 application topically daily.    Historical Provider, MD  ondansetron (ZOFRAN ODT) 8 MG disintegrating tablet 8mg  ODT q4 hours prn nausea Patient not taking: Reported on 01/29/2015 12/10/14   Veryl Speak, MD  promethazine (PHENERGAN) 25 MG tablet Take 25 mg by mouth as needed. For nausea/vomiting 12/08/14   Historical Provider, MD  tetrahydrozoline-zinc (VISINE-AC) 0.05-0.25 % ophthalmic solution Place 2 drops into both eyes 3 (three) times daily as needed. For itchy eyes     Historical Provider, MD  warfarin  (COUMADIN) 7.5 MG tablet Take 1 tablet (7.5 mg total) by mouth daily. 06/29/11 06/28/12  Rogers Blocker, MD     ROS: The patient denies fevers, chills, night sweats, unintentional weight loss, chest pain, palpitations, wheezing, dyspnea on exertion, nausea, vomiting, abdominal pain, dysuria, hematuria, melena, numbness, weakness, or tingling.  All other systems have been reviewed and were otherwise negative with the exception of those mentioned in the HPI and as above.    PHYSICAL EXAM: There were no vitals filed for this visit. There were no vitals filed for this visit. There is no weight on file to calculate BMI.   General: Alert, no acute distress HEENT:  Normocephalic, atraumatic, oropharynx patent. EOMI, PERRLA Cardiovascular:  Regular rate and rhythm, no rubs murmurs or gallops.  No Carotid bruits, radial pulse intact. No pedal edema.  Respiratory: Clear to auscultation bilaterally.  No wheezes, rales, or rhonchi.  No cyanosis, no use of accessory musculature GI: No organomegaly, abdomen is soft and non-tender, positive bowel sounds.  No masses. Skin: No rashes. Neurologic: Facial musculature symmetric. Psychiatric: Patient is appropriate throughout our interaction. Lymphatic: No cervical lymphadenopathy Musculoskeletal: Gait intact.   LABS: Results for orders placed or performed during the hospital encounter of 12/10/14  CBC with Differential  Result Value Ref Range   WBC 6.8 4.0 - 10.5 K/uL   RBC 4.78 3.87 - 5.11 MIL/uL   Hemoglobin 14.5 12.0 - 15.0 g/dL   HCT 42.3 36.0 - 46.0 %   MCV 88.5 78.0 - 100.0 fL   MCH 30.3 26.0 - 34.0 pg   MCHC 34.3 30.0 - 36.0 g/dL   RDW 13.0 11.5 - 15.5 %   Platelets 380 150 - 400 K/uL   Neutrophils Relative % 45 43 - 77 %   Neutro Abs 3.0 1.7 - 7.7 K/uL   Lymphocytes Relative 45 12 - 46 %   Lymphs Abs 3.1 0.7 - 4.0 K/uL   Monocytes Relative 6 3 - 12 %   Monocytes Absolute 0.4 0.1 - 1.0 K/uL   Eosinophils Relative 3 0 - 5 %   Eosinophils  Absolute 0.2 0.0 - 0.7 K/uL   Basophils Relative 1 0 - 1 %   Basophils Absolute 0.0 0.0 - 0.1 K/uL  Comprehensive metabolic panel  Result Value Ref Range   Sodium 141 135 - 145 mmol/L   Potassium 4.1 3.5 - 5.1 mmol/L   Chloride 105 96 - 112 mmol/L   CO2 25 19 - 32 mmol/L   Glucose, Bld 96 70 - 99 mg/dL   BUN 16 6 - 23 mg/dL   Creatinine, Ser 1.17 (H) 0.50 - 1.10 mg/dL   Calcium 10.2 8.4 - 10.5  mg/dL   Total Protein 8.0 6.0 - 8.3 g/dL   Albumin 4.4 3.5 - 5.2 g/dL   AST 22 0 - 37 U/L   ALT 20 0 - 35 U/L   Alkaline Phosphatase 78 39 - 117 U/L   Total Bilirubin 0.8 0.3 - 1.2 mg/dL   GFR calc non Af Amer 51 (L) >90 mL/min   GFR calc Af Amer 59 (L) >90 mL/min   Anion gap 11 5 - 15  Lipase, blood  Result Value Ref Range   Lipase 73 (H) 11 - 59 U/L     EKG/XRAY:   Primary read interpreted by Dr. Marin Comment at [facility].   ASSESSMENT/PLAN: Encounter Diagnoses  Name Primary?  . Lip laceration, initial encounter Yes  . Need for Tdap vaccination    TDap given Precautions given for infection Wound care instructions as directed Fu prn    Gross sideeffects, risk and benefits, and alternatives of medications d/w patient. Patient is aware that all medications have potential sideeffects and we are unable to predict every sideeffect or drug-drug interaction that may occur.  , [place], DO 01/30/2015 11:21 AM

## 2015-01-29 NOTE — Progress Notes (Signed)
Verbal consent obtained from patient.  Local anesthesia with 1cc 1% lido without epi.  Wound closed with #3 6-0 vicryl  Simple interrupted sutures.  Wound cleansed and dressed.

## 2015-06-02 ENCOUNTER — Other Ambulatory Visit: Payer: Self-pay | Admitting: Internal Medicine

## 2015-06-02 ENCOUNTER — Ambulatory Visit
Admission: RE | Admit: 2015-06-02 | Discharge: 2015-06-02 | Disposition: A | Discharge status: Home / Self Care | Payer: BC Managed Care – PPO | Source: Ambulatory Visit | Attending: Internal Medicine | Admitting: Internal Medicine

## 2015-06-02 DIAGNOSIS — J189 Pneumonia, unspecified organism: Secondary | ICD-10-CM

## 2015-07-01 ENCOUNTER — Other Ambulatory Visit: Payer: Self-pay | Admitting: Cardiology

## 2015-07-01 DIAGNOSIS — R079 Chest pain, unspecified: Secondary | ICD-10-CM

## 2015-07-15 ENCOUNTER — Encounter (HOSPITAL_COMMUNITY)
Admission: RE | Admit: 2015-07-15 | Discharge: 2015-07-15 | Disposition: A | Discharge status: Home / Self Care | Payer: BC Managed Care – PPO | Source: Ambulatory Visit | Attending: Cardiology | Admitting: Cardiology

## 2015-07-15 DIAGNOSIS — R079 Chest pain, unspecified: Secondary | ICD-10-CM | POA: Insufficient documentation

## 2015-07-15 MED ORDER — REGADENOSON 0.4 MG/5ML IV SOLN
0.4000 mg | Freq: Once | INTRAVENOUS | Status: AC
  Administered 2015-07-15: 0.4 mg via INTRAVENOUS

## 2015-07-15 MED ORDER — TECHNETIUM TC 99M SESTAMIBI GENERIC - CARDIOLITE
30.0000 | Freq: Once | INTRAVENOUS | Status: AC | PRN
  Administered 2015-07-15: 30 via INTRAVENOUS

## 2015-07-15 MED ORDER — REGADENOSON 0.4 MG/5ML IV SOLN
INTRAVENOUS | Status: AC
  Filled 2015-07-15: qty 5

## 2015-07-15 MED ORDER — TECHNETIUM TC 99M SESTAMIBI GENERIC - CARDIOLITE
10.0000 | Freq: Once | INTRAVENOUS | Status: AC | PRN
  Administered 2015-07-15: 10 via INTRAVENOUS

## 2015-07-16 LAB — NM MYOCAR SINGLE W/SPECT
CHL CUP STRESS STAGE 1 DBP: 91 mmHg
CHL CUP STRESS STAGE 1 GRADE: 0 %
CHL CUP STRESS STAGE 1 SBP: 138 mmHg
CHL CUP STRESS STAGE 1 SPEED: 0 mph
CHL CUP STRESS STAGE 3 GRADE: 0 %
CHL CUP STRESS STAGE 3 SPEED: 0 mph
CHL CUP STRESS STAGE 4 GRADE: 0 %
CHL CUP STRESS STAGE 4 HR: 100 {beats}/min
CHL CUP STRESS STAGE 4 SPEED: 0 mph
Estimated workload: 1 METS
Peak BP: 138 mmHg
Peak HR: 100 {beats}/min
Percent of predicted max HR: 61 %
Stage 1 HR: 86 {beats}/min
Stage 2 Grade: 0 %
Stage 2 HR: 86 {beats}/min
Stage 2 Speed: 0 mph
Stage 3 DBP: 86 mmHg
Stage 3 HR: 107 {beats}/min
Stage 3 SBP: 138 mmHg
Stage 4 DBP: 85 mmHg
Stage 4 SBP: 138 mmHg

## 2015-08-08 ENCOUNTER — Ambulatory Visit (INDEPENDENT_AMBULATORY_CARE_PROVIDER_SITE_OTHER): Payer: BC Managed Care – PPO | Admitting: Family Medicine

## 2015-08-08 VITALS — BP 132/78 | HR 96 | Temp 97.9°F | Resp 14 | Ht 63.5 in | Wt 173.2 lb

## 2015-08-08 DIAGNOSIS — J329 Chronic sinusitis, unspecified: Secondary | ICD-10-CM

## 2015-08-08 MED ORDER — FLUTICASONE PROPIONATE 50 MCG/ACT NA SUSP
2.0000 | Freq: Every day | NASAL | Status: DC
Start: 2015-08-08 — End: 2017-04-25

## 2015-08-08 MED ORDER — AZELASTINE HCL 0.15 % NA SOLN: 2.0000 | Freq: Every day | NASAL | Status: DC

## 2015-08-08 MED ORDER — AMOXICILLIN-POT CLAVULANATE 875-125 MG PO TABS: 1.0000 | ORAL_TABLET | Freq: Two times a day (BID) | ORAL | Status: DC

## 2015-08-08 MED ORDER — OXYMETAZOLINE HCL 0.05 % NA SOLN: 1.0000 | Freq: Two times a day (BID) | NASAL | Status: DC

## 2015-08-08 MED ORDER — GUAIFENESIN ER 1200 MG PO TB12: 1.0000 | ORAL_TABLET | Freq: Two times a day (BID) | ORAL | Status: DC | PRN

## 2015-08-08 NOTE — Progress Notes (Signed)
Subjective:    Patient ID: [name], female    DOB: [birth year], 59 y.o.   MRN: [id number]  By signing my name below, I, Zola Button, attest that this documentation has been prepared under the direction and in the presence of Delman Cheadle, MD.  Electronically Signed: Zola Button, Medical Scribe. 08/08/2015. 2:42 PM.  Chief Complaint  Patient presents with  . Nasal Congestion    green  . Adenopathy  . Sore Throat  . Neck Pain  . Immunizations    flu shot, pneumonia    HPI HPI Comments: [name] is a 59 y.o. female with a history of DM, HTN, and PE who presents to the [facility] complaining of bilateral neck pain that started 1 week ago. She has difficulty rotating her neck due to the pain. Patient also reports having nasal congestion, low-grade fever, chills, sore throat, and decreased appetite. She notes that her nasal discharge was initially clear, but is now green. She has not been sleeping well due to her symptoms. She has been taking OTC multi-symptom cold medication and Tylenol. Patient denies any problems with drinking, urinating, and making bowel movements. She is supposed to return to work in 2 days. Patient states that her blood sugars have been fine. Patient has recurrent sinusitis and reports that she frequently gets flares and requires antibiotics for them. She does not remember the last flare, but it may have been in the last 2 months. She is not taking her Flonase and Azelastine sprays for prevention.  She is anticoagulated due to history of PE. PE was in 2012, unprovoked, bilateral.  PCP: Dr. Marlou Sa; she has an appointment with him next week  Past Medical History  Diagnosis Date  . Essential hypertension, malignant   . Obesity, unspecified   . Acute maxillary sinusitis   . Extrinsic asthma, unspecified   . Acute bronchitis   . Ischemic colitis, enteritis, or enterocolitis ([place])     Hisotry of  . Sinusitis   . Allergic rhinitis   . Type II  or unspecified type diabetes mellitus without mention of complication, not stated as uncontrolled     oral meds-insulin resistance  . History of arteritis   . H/O: GI bleed   . Fibroids   . Dysmetabolic syndrome     History of  . Undifferentiated connective tissue disease ([place])     History of  . Otitis media of both ears    Current Outpatient Prescriptions on File Prior to Visit  Medication Sig Dispense Refill  . aspirin 325 MG tablet Take 325 mg by mouth daily.    . Biotin 1000 MCG tablet Take 1,000 mcg by mouth at bedtime.     . cholecalciferol (VITAMIN D) 1000 UNITS tablet Take 1,000 Units by mouth daily.    . cyclobenzaprine (FLEXERIL) 5 MG tablet Take 5 mg by mouth 2 (two) times daily as needed for muscle spasms.     Marland Kitchen diltiazem (CARDIZEM CD) 360 MG 24 hr capsule Take 360 mg by mouth daily.    Marland Kitchen gabapentin (NEURONTIN) 300 MG capsule Take 300 mg by mouth at bedtime.     Marland Kitchen losartan-hydrochlorothiazide (HYZAAR) 100-12.5 MG per tablet Take 1 tablet by mouth every evening.  1  . metFORMIN (GLUCOPHAGE) 500 MG tablet Take 500 mg by mouth every evening.     . mometasone (ELOCON) 0.1 % cream Apply 1 application topically daily. Reported on 08/08/2015    . montelukast (SINGULAIR) 10 MG  tablet Take 10 mg by mouth daily.  1  . fluticasone (FLOVENT HFA) 44 MCG/ACT inhaler Inhale 2 puffs into the lungs 2 (two) times daily. 1 Inhaler 3  . promethazine (PHENERGAN) 25 MG tablet Take 25 mg by mouth as needed. Reported on 08/08/2015    . tetrahydrozoline-zinc (VISINE-AC) 0.05-0.25 % ophthalmic solution Place 2 drops into both eyes 3 (three) times daily as needed. Reported on 08/08/2015    . XOPENEX HFA 45 MCG/ACT inhaler Inhale 1 puff into the lungs 2 (two) times daily. Reported on 08/08/2015     No current facility-administered medications on file prior to visit.   No Known Allergies   Review of Systems  Constitutional: Positive for fever, chills, activity change, appetite change and fatigue.    HENT: Positive for congestion, postnasal drip, rhinorrhea, sinus pressure and sore throat. Negative for facial swelling and nosebleeds.   Respiratory: Negative for cough and shortness of breath.   Gastrointestinal: Positive for nausea. Negative for vomiting and abdominal pain.  Genitourinary: Negative.  Negative for frequency.  Musculoskeletal: Positive for neck pain.  Neurological: Positive for headaches.  Hematological: Positive for adenopathy.  Psychiatric/Behavioral: Positive for sleep disturbance.       Objective:   Physical Exam  Constitutional: She is oriented to person, place, and time. She appears well-developed and well-nourished. No distress.  HENT:  Head: Normocephalic and atraumatic.  Left Ear: Tympanic membrane is erythematous and retracted. A middle ear effusion is present.  Nose: Rhinorrhea present.  Mouth/Throat: Oropharynx is clear and moist. No oropharyngeal exudate or posterior oropharyngeal erythema.  Right canal occluded with cerumen. Bilateral nasal edema, erythema, and purulent rhinorrhea. Nares completely occluded. Tonsillar edema, 3+ on the left, 2+ on the right.  Eyes: Pupils are equal, round, and reactive to light.  Neck: Neck supple.  Cardiovascular: Regular rhythm.  Tachycardia present.   Murmur heard.  Systolic murmur is present with a grade of 2/6  2/6 systolic ejection murmur, left upper sternal border.  Pulmonary/Chest: Effort normal.  Clear to auscultation bilaterally. Decent air movement, compromised by upper airway congestion.  Musculoskeletal: She exhibits no edema.  Lymphadenopathy:    She has cervical adenopathy.       Right cervical: No posterior cervical adenopathy present.      Left cervical: No posterior cervical adenopathy present.       Right: No supraclavicular adenopathy present.       Left: No supraclavicular adenopathy present.  Anterior cervical adenopathy.  Neurological: She is alert and oriented to person, place, and time. No  cranial nerve deficit.  Skin: Skin is warm and dry. No rash noted.  Psychiatric: She has a normal mood and affect. Her behavior is normal.  Nursing note and vitals reviewed.  BP 132/78 mmHg  Pulse 96  Temp(Src) 97.9 F (36.6 C) (Oral)  Resp 14  Ht 5' 3.5" (1.613 m)  Wt 173 lb 3.2 oz (78.563 kg)  BMI 30.20 kg/m2  SpO2 97%        Assessment & Plan:  If she needs work note, OK to call in for whatever is requested.   Patient would benefit substantially from systemic steroids, but due to diabetes, will try to avoid and restart nasal sprays and Afrin x3 days only. Avoid systemic decongestants due to HTN. If symptoms are not improved, instructed patient to RTC to consider using systemic steroids while increasing metofrmin dose as only on 500 qd currently. Watch cbgs during acute illness. Pt reports h/o recurrent sinusitis freq requiring antibiotics and  unresponsive to nasal steroids and netti pot so rec f/u w/ PCP to discuss other preventive strategies and need for imaging and/or ENT eval. Pt understands and agrees  1. Chronic sinusitis, unspecified location     Meds ordered this encounter  Medications  . amoxicillin-clavulanate (AUGMENTIN) 875-125 MG tablet    Sig: Take 1 tablet by mouth 2 (two) times daily.    Dispense:  28 tablet    Refill:  0  . fluticasone (FLONASE) 50 MCG/ACT nasal spray    Sig: Place 2 sprays into both nostrils daily. Reported on 08/08/2015    Dispense:  16 g    Refill:  11  . Azelastine HCl 0.15 % SOLN    Sig: Place 2 sprays into both nostrils daily. For allergies    Dispense:  30 mL    Refill:  5  . oxymetazoline (AFRIN NASAL SPRAY) 0.05 % nasal spray    Sig: Place 1 spray into both nostrils 2 (two) times daily. FOR NO LONGER THAN 3 DAYS AT A STRETCH    Dispense:  30 mL    Refill:  0  . Guaifenesin (MUCINEX MAXIMUM STRENGTH) 1200 MG TB12    Sig: Take 1 tablet (1,200 mg total) by mouth every 12 (twelve) hours as needed. FOR 1 WEEK    Dispense:  14  tablet    Refill:  1    I personally performed the services described in this documentation, which was scribed in my presence. The recorded information has been reviewed and considered, and addended by me as needed.  Delman Cheadle, MD MPH

## 2015-08-08 NOTE — Patient Instructions (Addendum)
FLONASE and AZELASTINE will not help treat sinus infections but they will help prevent them when use chronically combined with a netti pot or nasal saline. If you are using these daily and still get sinus infections, then you need to see ENT for further evaluation to try to fix the underlying source. Due to your co-morbid illnesses of diabetes and hypertension, it would be good to avoid systemic steroids and decongestents if able so please start mucinex and start a course of AFRIN BUT ONLY USE FOR 3 DAYS - AFTER THAT YOU WILL DEVELOP REBOUND CONGESTION AND IF USED FOR MORE THAN 3 DAYS YOUR NASAL CONGESTION MAY BECOME WORSE WHEN YOU STOP IT.  Feel free to call if you need a work note for this week.  If your symptoms do not improve in 3-4d or worsen at all, please come back in for further evaluation.  Sinusitis, Adult Sinusitis is redness, soreness, and inflammation of the paranasal sinuses. Paranasal sinuses are air pockets within the bones of your face. They are located beneath your eyes, in the middle of your forehead, and above your eyes. In healthy paranasal sinuses, mucus is able to drain out, and air is able to circulate through them by way of your nose. However, when your paranasal sinuses are inflamed, mucus and air can become trapped. This can allow bacteria and other germs to grow and cause infection. Sinusitis can develop quickly and last only a short time (acute) or continue over a long period (chronic). Sinusitis that lasts for more than 12 weeks is considered chronic. CAUSES Causes of sinusitis include:  Allergies.  Structural abnormalities, such as displacement of the cartilage that separates your nostrils (deviated septum), which can decrease the air flow through your nose and sinuses and affect sinus drainage.  Functional abnormalities, such as when the small hairs (cilia) that line your sinuses and help remove mucus do not work properly or are not present. SIGNS AND  SYMPTOMS Symptoms of acute and chronic sinusitis are the same. The primary symptoms are pain and pressure around the affected sinuses. Other symptoms include:  Upper toothache.  Earache.  Headache.  Bad breath.  Decreased sense of smell and taste.  A cough, which worsens when you are lying flat.  Fatigue.  Fever.  Thick drainage from your nose, which often is green and may contain pus (purulent).  Swelling and warmth over the affected sinuses. DIAGNOSIS Your health care provider will perform a physical exam. During your exam, your health care provider may perform any of the following to help determine if you have acute sinusitis or chronic sinusitis:  Look in your nose for signs of abnormal growths in your nostrils (nasal polyps).  Tap over the affected sinus to check for signs of infection.  View the inside of your sinuses using an imaging device that has a light attached (endoscope). If your health care provider suspects that you have chronic sinusitis, one or more of the following tests may be recommended:  Allergy tests.  Nasal culture. A sample of mucus is taken from your nose, sent to a lab, and screened for bacteria.  Nasal cytology. A sample of mucus is taken from your nose and examined by your health care provider to determine if your sinusitis is related to an allergy. TREATMENT Most cases of acute sinusitis are related to a viral infection and will resolve on their own within 10 days. Sometimes, medicines are prescribed to help relieve symptoms of both acute and chronic sinusitis. These may include  pain medicines, decongestants, nasal steroid sprays, or saline sprays. However, for sinusitis related to a bacterial infection, your health care provider will prescribe antibiotic medicines. These are medicines that will help kill the bacteria causing the infection. Rarely, sinusitis is caused by a fungal infection. In these cases, your health care provider will prescribe  antifungal medicine. For some cases of chronic sinusitis, surgery is needed. Generally, these are cases in which sinusitis recurs more than 3 times per year, despite other treatments. HOME CARE INSTRUCTIONS  Drink plenty of water. Water helps thin the mucus so your sinuses can drain more easily.  Use a humidifier.  Inhale steam 3-4 times a day (for example, sit in the bathroom with the shower running).  Apply a warm, moist washcloth to your face 3-4 times a day, or as directed by your health care provider.  Use saline nasal sprays to help moisten and clean your sinuses.  Take medicines only as directed by your health care provider.  If you were prescribed either an antibiotic or antifungal medicine, finish it all even if you start to feel better. SEEK IMMEDIATE MEDICAL CARE IF:  You have increasing pain or severe headaches.  You have nausea, vomiting, or drowsiness.  You have swelling around your face.  You have vision problems.  You have a stiff neck.  You have difficulty breathing.   This information is not intended to replace advice given to you by your health care provider. Make sure you discuss any questions you have with your health care provider.   Document Released: 07/30/2005 Document Revised: 08/20/2014 Document Reviewed: 08/14/2011 Elsevier Interactive Patient Education [employer].

## 2015-10-06 ENCOUNTER — Other Ambulatory Visit: Payer: Self-pay

## 2015-10-06 DIAGNOSIS — Z1231 Encounter for screening mammogram for malignant neoplasm of breast: Secondary | ICD-10-CM

## 2015-10-17 ENCOUNTER — Ambulatory Visit
Admission: RE | Admit: 2015-10-17 | Discharge: 2015-10-17 | Disposition: A | Discharge status: Home / Self Care | Payer: BC Managed Care – PPO | Source: Ambulatory Visit

## 2015-10-17 DIAGNOSIS — Z1231 Encounter for screening mammogram for malignant neoplasm of breast: Secondary | ICD-10-CM

## 2016-01-02 ENCOUNTER — Ambulatory Visit (INDEPENDENT_AMBULATORY_CARE_PROVIDER_SITE_OTHER): Payer: BC Managed Care – PPO | Admitting: Physician Assistant

## 2016-01-02 VITALS — BP 128/80 | HR 99 | Temp 98.1°F | Resp 16 | Ht 65.0 in | Wt 181.0 lb

## 2016-01-02 DIAGNOSIS — H6121 Impacted cerumen, right ear: Secondary | ICD-10-CM

## 2016-01-02 NOTE — Progress Notes (Signed)
[facility] [address] [place] [address] [phone]  Date:  01/02/2016   Name:  [name]   DOB:  1955/10/19   MRN:  [id number]  PCP:  Rogers Blocker, MD    History of Present Illness:  [name] is a 60 y.o. female patient who presents to [facility] for cc of right ear fullness and cerumen impaction.  She was seen by her pcp, Dr. Marlou Sa, who advised her of cerumen impaction.  She was not able to get it cleared there.  She has had trouble hearing from this ear.  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   Patient Active Problem List   Diagnosis Date Noted  . Dyspnea on exertion 06/28/2011  . Constipation, acute 06/26/2011  . Pulmonary embolism ([place]) 06/25/2011  . HTN (hypertension) 06/25/2011  . DM (diabetes mellitus) ([place]) 06/25/2011  . Connective tissue disorder ([place]) 06/25/2011    Past Medical History  Diagnosis Date  . Essential hypertension, malignant   . Obesity, unspecified   . Acute maxillary sinusitis   . Extrinsic asthma, unspecified   . Acute bronchitis   . Ischemic colitis, enteritis, or enterocolitis ([place])     Hisotry of  . Sinusitis   . Allergic rhinitis   . Type II or unspecified type diabetes mellitus without mention of complication, not stated as uncontrolled     oral meds-insulin resistance  . History of arteritis   . H/O: GI bleed   . Fibroids   . Dysmetabolic syndrome     History of  . Undifferentiated connective tissue disease ([place])     History of  . Otitis media of both ears     Past Surgical History  Procedure Laterality Date  . Colonoscopy w/ biopsies    . Abdominal hysterectomy  2000  . Cardiac catheterization  09/25/05    Social History  Substance Use Topics  . Smoking status: Never Smoker   . Smokeless tobacco: Never Used  . Alcohol Use: No    Family History  Problem Relation Age of Onset  . Kidney disease Mother     No Known Allergies  Medication list has  been reviewed and updated.  Current Outpatient Prescriptions on File Prior to Visit  Medication Sig Dispense Refill  . amoxicillin-clavulanate (AUGMENTIN) 875-125 MG tablet Take 1 tablet by mouth 2 (two) times daily. 28 tablet 0  . aspirin 325 MG tablet Take 325 mg by mouth daily.    . Biotin 1000 MCG tablet Take 1,000 mcg by mouth at bedtime.     . cholecalciferol (VITAMIN D) 1000 UNITS tablet Take 1,000 Units by mouth daily.    . cyclobenzaprine (FLEXERIL) 5 MG tablet Take 5 mg by mouth 2 (two) times daily as needed for muscle spasms.     Marland Kitchen diltiazem (CARDIZEM CD) 360 MG 24 hr capsule Take 360 mg by mouth daily.    . fluticasone (FLONASE) 50 MCG/ACT nasal spray Place 2 sprays into both nostrils daily. Reported on 08/08/2015 16 g 11  . gabapentin (NEURONTIN) 300 MG capsule Take 300 mg by mouth at bedtime.     . Guaifenesin (MUCINEX MAXIMUM STRENGTH) 1200 MG TB12 Take 1 tablet (1,200 mg total) by mouth every 12 (twelve) hours as needed. FOR 1 WEEK 14 tablet 1  . losartan-hydrochlorothiazide (HYZAAR) 100-12.5 MG per tablet Take 1 tablet by mouth every evening.  1  . metFORMIN (GLUCOPHAGE) 500  MG tablet Take 500 mg by mouth every evening.     . mometasone (ELOCON) 0.1 % cream Apply 1 application topically daily. Reported on 08/08/2015    . montelukast (SINGULAIR) 10 MG tablet Take 10 mg by mouth daily.  1  . tetrahydrozoline-zinc (VISINE-AC) 0.05-0.25 % ophthalmic solution Place 2 drops into both eyes 3 (three) times daily as needed. Reported on 08/08/2015    . XOPENEX HFA 45 MCG/ACT inhaler Inhale 1 puff into the lungs 2 (two) times daily. Reported on 08/08/2015    . Azelastine HCl 0.15 % SOLN Place 2 sprays into both nostrils daily. For allergies (Patient not taking: Reported on 01/02/2016) 30 mL 5  . fluticasone (FLOVENT HFA) 44 MCG/ACT inhaler Inhale 2 puffs into the lungs 2 (two) times daily. 1 Inhaler 3  . oxymetazoline (AFRIN NASAL SPRAY) 0.05 % nasal spray Place 1 spray into both nostrils 2  (two) times daily. FOR NO LONGER THAN 3 DAYS AT A STRETCH (Patient not taking: Reported on 01/02/2016) 30 mL 0  . promethazine (PHENERGAN) 25 MG tablet Take 25 mg by mouth as needed. Reported on 01/02/2016     No current facility-administered medications on file prior to visit.    ROS ROS otherwise unremarkable unless listed above.   Physical Examination: BP 128/80 mmHg  Pulse 99  Temp(Src) 98.1 F (36.7 C) (Oral)  Resp 16  Ht 5\' 5"  (1.651 m)  Wt 181 lb (82.101 kg)  BMI 30.12 kg/m2  SpO2 99% Ideal Body Weight: Weight in (lb) to have BMI = 25: 149.9  Physical Exam  Constitutional: She is oriented to person, place, and time. She appears well-developed and well-nourished. No distress.  HENT:  Head: Normocephalic and atraumatic.  Right Ear: External ear and ear canal normal.  Left Ear: External ear and ear canal normal. Tympanic membrane is not injected, not perforated and not bulging.  Right tm is normal, once irrigated without erythema or bulge.  Eyes: Conjunctivae and EOM are normal. Pupils are equal, round, and reactive to light.  Cardiovascular: Normal rate.   Pulmonary/Chest: Effort normal. No respiratory distress.  Abdominal: Soft. Bowel sounds are normal. She exhibits no distension. There is no tenderness.  Neurological: She is alert and oriented to person, place, and time.  Skin: She is not diaphoretic.  Psychiatric: She has a normal mood and affect. Her behavior is normal.     Assessment and Plan: [name] is a 60 y.o. female who is here today for cc of cerumen impaction. CMA irrigated without complication.  Discomfort resolved.    Cerumen impaction, right  Ivar Drape, PA-C [facility] 01/02/2016 3:13 PM

## 2016-01-02 NOTE — Patient Instructions (Addendum)
     IF you received an x-ray today, you will receive an invoice from [facility]. Please contact [facility] at [phone] with questions or concerns regarding your invoice.   IF you received labwork today, you will receive an invoice from [facility]. Please contact [facility] at [phone] with questions or concerns regarding your invoice.   Our billing staff will not be able to assist you with questions regarding bills from these companies.  You will be contacted with the lab results as soon as they are available. The fastest way to get your results is to activate your My Chart account. Instructions are located on the last page of this paperwork. If you have not heard from [place] regarding the results in 2 weeks, please contact this office.    Cerumen Impaction The structures of the external ear canal secrete a waxy substance known as cerumen. Excess cerumen can build up in the ear canal, causing a condition known as cerumen impaction. Cerumen impaction can cause ear pain and disrupt the function of the ear. The rate of cerumen production differs for each individual. In certain individuals, the configuration of the ear canal may decrease his or her ability to naturally remove cerumen. CAUSES Cerumen impaction is caused by excessive cerumen production or buildup. RISK FACTORS  Frequent use of swabs to clean ears.  Having narrow ear canals.  Having eczema.  Being dehydrated. SIGNS AND SYMPTOMS  Diminished hearing.  Ear drainage.  Ear pain.  Ear itch. TREATMENT Treatment may involve:  Over-the-counter or prescription ear drops to soften the cerumen.  Removal of cerumen by a health care provider. This may be done with:  Irrigation with warm water. This is the most common method of removal.  Ear curettes and other instruments.  Surgery. This may be done in severe cases. HOME CARE INSTRUCTIONS  Take medicines only as directed by  your health care provider.  Do not insert objects into the ear with the intent of cleaning the ear. PREVENTION  Do not insert objects into the ear, even with the intent of cleaning the ear. Removing cerumen as a part of normal hygiene is not necessary, and the use of swabs in the ear canal is not recommended.  Drink enough water to keep your urine clear or pale yellow.  Control your eczema if you have it. SEEK MEDICAL CARE IF:  You develop ear pain.  You develop bleeding from the ear.  The cerumen does not clear after you use ear drops as directed.   This information is not intended to replace advice given to you by your health care provider. Make sure you discuss any questions you have with your health care provider.   Document Released: 09/06/2004 Document Revised: 08/20/2014 Document Reviewed: 03/16/2015 Elsevier Interactive Patient Education [employer].

## 2016-09-26 ENCOUNTER — Other Ambulatory Visit: Payer: Self-pay | Admitting: Internal Medicine

## 2016-09-26 DIAGNOSIS — Z1231 Encounter for screening mammogram for malignant neoplasm of breast: Secondary | ICD-10-CM

## 2016-10-17 ENCOUNTER — Ambulatory Visit
Admission: RE | Admit: 2016-10-17 | Discharge: 2016-10-17 | Disposition: A | Discharge status: Home / Self Care | Payer: BC Managed Care – PPO | Source: Ambulatory Visit | Attending: Internal Medicine | Admitting: Internal Medicine

## 2016-10-17 DIAGNOSIS — Z1231 Encounter for screening mammogram for malignant neoplasm of breast: Secondary | ICD-10-CM

## 2016-12-20 ENCOUNTER — Ambulatory Visit (INDEPENDENT_AMBULATORY_CARE_PROVIDER_SITE_OTHER): Payer: BC Managed Care – PPO | Admitting: Physician Assistant

## 2016-12-20 ENCOUNTER — Ambulatory Visit (INDEPENDENT_AMBULATORY_CARE_PROVIDER_SITE_OTHER): Payer: BC Managed Care – PPO

## 2016-12-20 ENCOUNTER — Encounter: Payer: Self-pay | Admitting: Physician Assistant

## 2016-12-20 VITALS — BP 137/80 | HR 103 | Temp 98.7°F | Resp 17 | Ht 65.0 in | Wt 166.0 lb

## 2016-12-20 DIAGNOSIS — M79644 Pain in right finger(s): Secondary | ICD-10-CM

## 2016-12-20 DIAGNOSIS — M65311 Trigger thumb, right thumb: Secondary | ICD-10-CM | POA: Diagnosis not present

## 2016-12-20 NOTE — Progress Notes (Signed)
PROCEDURE NOTE: Trigger Thumb Injection Risks of procedure were discussed with patient. Patient verbalized understanding, verbal consent obtained. Alcohol prep pad was used to wipe base of right thumb over palmar surface. A retracted ball point pen was used to demarcate injection site over medial/ulnar aspect of base of thumb. A total of 3 iodine swabs were used for sterile prep. Superficial numbing was performed using ethyl chloride, followed by a swift wipe with an alcohol prep pad. A 25g needle was inserted into the injection site at an approximate 45 degree angle. A mixture of 1.22mL containing 30g of triamcinolone and 2% lidocaine was subsequently injected without incident. Cleansed, pressure dressing applied. After 5 minutes, patient's thumb range of motion in flexion, extension, abduction and adduction were present. Patient tolerated this procedure well. She is to follow up in 2 weeks.

## 2016-12-20 NOTE — Patient Instructions (Signed)
     IF you received an x-ray today, you will receive an invoice from [facility]. Please contact [facility] at [phone] with questions or concerns regarding your invoice.   IF you received labwork today, you will receive an invoice from [facility]. Please contact [facility] at [phone] with questions or concerns regarding your invoice.   Our billing staff will not be able to assist you with questions regarding bills from these companies.  You will be contacted with the lab results as soon as they are available. The fastest way to get your results is to activate your My Chart account. Instructions are located on the last page of this paperwork. If you have not heard from us regarding the results in 2 weeks, please contact this office.     

## 2016-12-20 NOTE — Progress Notes (Signed)
PRIMARY CARE AT [facility] [address], [place] [address] [phone]  Date:  12/20/2016   Name:  [name]   DOB:  [birth year]   MRN:  [id number]  PCP:  Rogers Blocker, MD    History of Present Illness:  [name] is a 61 y.o. female patient who presents to PCP with  Chief Complaint  Patient presents with  . trigger finger    Rt thumb. Needs cortisone shot per Dr. Marlou Sa     Patient was seen by her primary care doctor in the last week for cc of right thumb pain that had locking with flexion.  No trauma that she noted.  Feels it locking, swelling, and pain that has worsened.  She is right hand dominant and makes it difficult to write. Advised to come here for injection  Patient Active Problem List   Diagnosis Date Noted  . Dyspnea on exertion 06/28/2011  . Constipation, acute 06/26/2011  . Pulmonary embolism ([place]) 06/25/2011  . HTN (hypertension) 06/25/2011  . DM (diabetes mellitus) ([place]) 06/25/2011  . Connective tissue disorder ([place]) 06/25/2011    Past Medical History:  Diagnosis Date  . Acute bronchitis   . Acute maxillary sinusitis   . Allergic rhinitis   . Dysmetabolic syndrome    History of  . Essential hypertension, malignant   . Extrinsic asthma, unspecified   . Fibroids   . H/O: GI bleed   . History of arteritis   . Ischemic colitis, enteritis, or enterocolitis ([place])    Hisotry of  . Obesity, unspecified   . Otitis media of both ears   . Sinusitis   . Type II or unspecified type diabetes mellitus without mention of complication, not stated as uncontrolled    oral meds-insulin resistance  . Undifferentiated connective tissue disease ([place])    History of    Past Surgical History:  Procedure Laterality Date  . ABDOMINAL HYSTERECTOMY  2000  . BREAST EXCISIONAL BIOPSY Left 1978  . CARDIAC CATHETERIZATION  09/25/05  . COLONOSCOPY W/ BIOPSIES      Social History  Substance Use Topics  . Smoking status: Never Smoker  . Smokeless tobacco: Never  Used  . Alcohol use No    Family History  Problem Relation Age of Onset  . Kidney disease Mother     No Known Allergies  Medication list has been reviewed and updated.  Current Outpatient Prescriptions on File Prior to Visit  Medication Sig Dispense Refill  . aspirin 325 MG tablet Take 325 mg by mouth as needed.     . Biotin 1000 MCG tablet Take 1,000 mcg by mouth at bedtime.     . cholecalciferol (VITAMIN D) 1000 UNITS tablet Take 1,000 Units by mouth daily.    . cyclobenzaprine (FLEXERIL) 5 MG tablet Take 5 mg by mouth 2 (two) times daily as needed for muscle spasms.     Marland Kitchen diltiazem (CARDIZEM CD) 360 MG 24 hr capsule Take 360 mg by mouth daily.    . fluticasone (FLONASE) 50 MCG/ACT nasal spray Place 2 sprays into both nostrils daily. Reported on 08/08/2015 16 g 11  . gabapentin (NEURONTIN) 300 MG capsule Take 300 mg by mouth at bedtime.     Marland Kitchen losartan-hydrochlorothiazide (HYZAAR) 100-12.5 MG per tablet Take 1 tablet by mouth every evening.  1  . metFORMIN (GLUCOPHAGE) 500 MG tablet Take 500 mg by mouth every evening.     . montelukast (SINGULAIR) 10 MG tablet Take 10 mg by mouth  daily.  1  . oxymetazoline (AFRIN NASAL SPRAY) 0.05 % nasal spray Place 1 spray into both nostrils 2 (two) times daily. FOR NO LONGER THAN 3 DAYS AT A STRETCH 30 mL 0  . tetrahydrozoline-zinc (VISINE-AC) 0.05-0.25 % ophthalmic solution Place 2 drops into both eyes 3 (three) times daily as needed. Reported on 08/08/2015    . XOPENEX HFA 45 MCG/ACT inhaler Inhale 1 puff into the lungs 2 (two) times daily. Reported on 08/08/2015    . Azelastine HCl 0.15 % SOLN Place 2 sprays into both nostrils daily. For allergies (Patient not taking: Reported on 01/02/2016) 30 mL 5  . fluticasone (FLOVENT HFA) 44 MCG/ACT inhaler Inhale 2 puffs into the lungs 2 (two) times daily. 1 Inhaler 3   No current facility-administered medications on file prior to visit.     ROS ROS otherwise unremarkable unless listed  above.  Physical Examination: BP 137/80 (BP Location: Right Arm, Patient Position: Sitting, Cuff Size: Normal)   Pulse (!) 103   Temp 98.7 F (37.1 C) (Oral)   Resp 17   Ht 5\' 5"  (1.651 m)   Wt 166 lb (75.3 kg)   SpO2 99%   BMI 27.62 kg/m  Ideal Body Weight: Weight in (lb) to have BMI = 25: 149.9  Physical Exam  Constitutional: She is oriented to person, place, and time. She appears well-developed and well-nourished. No distress.  HENT:  Head: Normocephalic and atraumatic.  Right Ear: External ear normal.  Left Ear: External ear normal.  Eyes: Conjunctivae and EOM are normal. Pupils are equal, round, and reactive to light.  Cardiovascular: Normal rate.   Pulmonary/Chest: Effort normal. No respiratory distress.  Musculoskeletal:       Right hand: She exhibits tenderness (1st metacarpal base), bony tenderness and swelling. She exhibits no deformity. She exhibits no thumb/finger opposition.  Jerking flexion of the dip  Neurological: She is alert and oriented to person, place, and time.  Skin: She is not diaphoretic.  Psychiatric: She has a normal mood and affect. Her behavior is normal.   Dg Finger Thumb Right  Result Date: 12/20/2016 CLINICAL DATA:  Rt Thumb pain x 3 weeks - NKI - EXAM: RIGHT THUMB 2+V COMPARISON:  None. FINDINGS: There is no evidence of fracture or dislocation. There is no evidence of arthropathy or other focal bone abnormality. Soft tissues are unremarkable IMPRESSION: Negative. Electronically Signed   By: Lucrezia Europe M.D.   On: 12/20/2016 10:26     Assessment and Plan: [name] is a 61 y.o. female who is here today for cc of right thumb pain Trigger finger injection placed without complication by Rosario Adie, see note. Trigger thumb of right hand  Pain of right thumb - Plan: DG Finger Thumb Right  Ivar Drape, PA-C [facility] 5/11/20188:40 PM

## 2017-01-03 ENCOUNTER — Encounter: Payer: Self-pay | Admitting: Urgent Care

## 2017-01-03 ENCOUNTER — Ambulatory Visit (INDEPENDENT_AMBULATORY_CARE_PROVIDER_SITE_OTHER): Payer: BC Managed Care – PPO | Admitting: Urgent Care

## 2017-01-03 VITALS — BP 150/90 | HR 100 | Temp 98.2°F | Resp 16 | Ht 63.62 in | Wt 168.0 lb

## 2017-01-03 DIAGNOSIS — M79644 Pain in right finger(s): Secondary | ICD-10-CM

## 2017-01-03 DIAGNOSIS — I10 Essential (primary) hypertension: Secondary | ICD-10-CM

## 2017-01-03 DIAGNOSIS — M65311 Trigger thumb, right thumb: Secondary | ICD-10-CM | POA: Diagnosis not present

## 2017-01-03 DIAGNOSIS — R030 Elevated blood-pressure reading, without diagnosis of hypertension: Secondary | ICD-10-CM | POA: Diagnosis not present

## 2017-01-03 NOTE — Patient Instructions (Addendum)
PROCEDURE NOTE: Trigger Thumb Injection Risks of procedure were discussed with patient. Patient verbalized understanding, verbal consent obtained. Alcohol prep pad was used to wipe base of right thumb over palmar surface. A retracted ball point pen was used to demarcate injection site over medial/ulnar aspect of base of thumb. A total of 3 iodine swabs were used for sterile prep. Superficial numbing was performed using ethyl chloride, followed by a swift wipe with an alcohol prep pad. A 25g needle was inserted into the injection site at an approximate 45 degree angle. A mixture of 1.31mL containing 30g of triamcinolone and 2% lidocaine was subsequently injected without incident. Cleansed, pressure dressing applied. After 5 minutes, patient's thumb range of motion in flexion, extension, abduction and adduction were present. Patient tolerated this procedure well. She is to follow up in 2 weeks.   Triamcinolone injection What is this medicine? TRIAMCINOLONE (trye am SIN oh lone) is a corticosteroid. It helps to reduce swelling, redness, itching, and allergic reactions. This medicine is used to treat allergies, arthritis, asthma, skin problems, and many other conditions. This medicine may be used for other purposes; ask your health care provider or pharmacist if you have questions. COMMON BRAND NAME(S): Aristocort, Aristocort Forte, Aristospan, Arze-Ject-A, Kenalog, Tac-3, Triamonide, Triesence What should I tell my health care provider before I take this medicine? They need to know if you have any of these conditions: -depression, anxiety, or psychotic disturbances -diabetes -infection, like tuberculosis, herpes, or fungal infection -liver disease -osteoporosis -previous heart attack -seizures -stomach or intestine disease -thyroid disease -an unusual or allergic reaction to triamcinolone, corticosteroids, benzyl alcohol, other medicines, foods, dyes, or preservatives -pregnant or trying to get  pregnant -breast-feeding How should I use this medicine? This medicine is injected by a health care professional. After your dose follow your doctor's instructions for your care. Contact your pediatrician regarding the use of this medicine in children. Special care may be needed. Overdosage: If you think you have taken too much of this medicine contact a poison control center or emergency room at once. NOTE: This medicine is only for you. Do not share this medicine with others. What if I miss a dose? This does not apply. What may interact with this medicine? Do not take this medicine with any of the following medications: -mifepristone This medicine may also interact with the following medications: -aspirin -other steroid medicines -vaccines and other immunization products This list may not describe all possible interactions. Give your health care provider a list of all the medicines, herbs, non-prescription drugs, or dietary supplements you use. Also tell them if you smoke, drink alcohol, or use illegal drugs. Some items may interact with your medicine. What should I watch for while using this medicine? Visit your doctor or health care professional for regular checks on your progress. If you are diabetic, check your blood sugar as directed. If you are taking this medicine for a long time, carry an identification card with your name, the type and dose of medicine, and your doctor's name and address. You may need to be on a special diet while taking this medicine. Talk to your doctor. Do not come in contact with people who have chickenpox or the measles while you are taking this medicine. If you do, call your doctor right away. What side effects may I notice from receiving this medicine? Side effects that you should report to your doctor or health care professional as soon as possible: -allergic reactions like skin rash, itching or hives, swelling of  the face, lips, or tongue -black, tarry  stools -breathing problems -changes in vision -confusion, depression, excitement, mood swings -dizziness -fever, infection, sores that do not heal -frequent passing of urine -high blood pressure -increased thirst -lumpy, thin skin at site where injected -menstrual problems -pain in back, hips, shoulders, ribs -rounding of face -seizures -stomach pain -swelling of feet, hands -unusual bruising or red pinpoint spots on the skin -unusually weak or tired Side effects that usually do not require medical attention (report to your doctor or health care professional if they continue or are bothersome): -headache -increased sweating -trouble sleeping -unusual increased growth of hair on the face or body -upset stomach, nausea This list may not describe all possible side effects. Call your doctor for medical advice about side effects. You may report side effects to FDA at 1-800-FDA-1088. Where should I keep my medicine? Keep out of the reach of children. Store at room temperature between 15 and 30 degrees C (59 and 86 degrees F). Do not freeze. Protect from light. Throw away any unused medicine after the expiration date. NOTE: This sheet is a summary. It may not cover all possible information. If you have questions about this medicine, talk to your doctor, pharmacist, or health care provider.  2018 Elsevier/Gold Standard (2007-10-10 16:14:48)     IF you received an x-ray today, you will receive an invoice from [facility]. Please contact [facility] at [phone] with questions or concerns regarding your invoice.   IF you received labwork today, you will receive an invoice from [facility]. Please contact [facility] at [phone] with questions or concerns regarding your invoice.   Our billing staff will not be able to assist you with questions regarding bills from these companies.  You will be contacted with the lab results as soon as they are available. The fastest  way to get your results is to activate your My Chart account. Instructions are located on the last page of this paperwork. If you have not heard from [place] regarding the results in 2 weeks, please contact this office.

## 2017-01-03 NOTE — Progress Notes (Signed)
    MRN: [id number] DOB: [birth year]  Subjective:   [name] is a 61 y.o. female presenting for follow up on right trigger thumb. Patient was last seen on 12/20/2016, had trigger thumb injection with triamcinolone, lidocaine mixture. Today, she reports dramatic improvement in her pain and thumb locking. She still feels a mild click in the DIP of her right thumb joint but still admits significant improvement. Denies fever, redness, streaking, swelling.   [name] has a current medication list which includes the following prescription(s): aspirin, azelastine hcl, biotin, cholecalciferol, cyclobenzaprine, diltiazem, fluticasone, fluticasone, gabapentin, losartan-hydrochlorothiazide, metformin, montelukast, oxymetazoline, tetrahydrozoline-zinc, and xopenex hfa. Also has No Known Allergies.  [name]  has a past medical history of Acute bronchitis; Acute maxillary sinusitis; Allergic rhinitis; Dysmetabolic syndrome; Essential hypertension, malignant; Extrinsic asthma, unspecified; Fibroids; H/O: GI bleed; History of arteritis; Ischemic colitis, enteritis, or enterocolitis ([place]); Obesity, unspecified; Otitis media of both ears; Sinusitis; Type II or unspecified type diabetes mellitus without mention of complication, not stated as uncontrolled; and Undifferentiated connective tissue disease ([place]). Also  has a past surgical history that includes Colonoscopy w/ biopsies; Abdominal hysterectomy (2000); Cardiac catheterization (09/25/05); and Breast excisional biopsy (Left, 1978).  Objective:   Vitals: BP (!) 150/90 (BP Location: Right Arm, Patient Position: Sitting, Cuff Size: Normal)   Pulse 100   Temp 98.2 F (36.8 C) (Oral)   Resp 16   Ht 5' 3.62" (1.616 m)   Wt 168 lb (76.2 kg)   SpO2 98%   BMI 29.18 kg/m   Physical Exam  Constitutional: She is oriented to person, place, and time. She appears well-developed and well-nourished.  Cardiovascular: Normal rate.   Pulmonary/Chest: Effort normal.    Musculoskeletal:       Right hand: She exhibits normal range of motion, no tenderness, no bony tenderness, normal capillary refill, no deformity and no swelling. Normal sensation noted. Normal strength noted.       Hands: Neurological: She is alert and oriented to person, place, and time.    Assessment and Plan :   1. Trigger thumb of right hand 2. Pain of right thumb - Significant improvement. If problem persists, we can consider repeating injection, referral to PT or ortho for surgical consult.  3. Essential hypertension 4. Elevated blood pressure reading - Recommended patient f/u with PCP, Dr. Marlou Sa. Notes will be forwarded to him.  Jaynee Eagles, PA-C [facility] [phone] 01/03/2017 9:03 AM

## 2017-01-25 ENCOUNTER — Encounter (HOSPITAL_COMMUNITY): Payer: Self-pay

## 2017-01-25 ENCOUNTER — Emergency Department (HOSPITAL_COMMUNITY): Payer: BC Managed Care – PPO

## 2017-01-25 ENCOUNTER — Inpatient Hospital Stay (HOSPITAL_COMMUNITY)
Admission: EM | Admit: 2017-01-25 | Discharge: 2017-02-01 | DRG: 176 | Disposition: A | Discharge status: Home / Self Care | Payer: BC Managed Care – PPO | Attending: Family Medicine | Admitting: Family Medicine

## 2017-01-25 DIAGNOSIS — R778 Other specified abnormalities of plasma proteins: Secondary | ICD-10-CM

## 2017-01-25 DIAGNOSIS — E118 Type 2 diabetes mellitus with unspecified complications: Secondary | ICD-10-CM

## 2017-01-25 DIAGNOSIS — Z7951 Long term (current) use of inhaled steroids: Secondary | ICD-10-CM

## 2017-01-25 DIAGNOSIS — I2699 Other pulmonary embolism without acute cor pulmonale: Principal | ICD-10-CM | POA: Diagnosis present

## 2017-01-25 DIAGNOSIS — Z841 Family history of disorders of kidney and ureter: Secondary | ICD-10-CM

## 2017-01-25 DIAGNOSIS — R0609 Other forms of dyspnea: Secondary | ICD-10-CM | POA: Diagnosis not present

## 2017-01-25 DIAGNOSIS — I10 Essential (primary) hypertension: Secondary | ICD-10-CM | POA: Diagnosis present

## 2017-01-25 DIAGNOSIS — Z6829 Body mass index (BMI) 29.0-29.9, adult: Secondary | ICD-10-CM

## 2017-01-25 DIAGNOSIS — N179 Acute kidney failure, unspecified: Secondary | ICD-10-CM | POA: Diagnosis present

## 2017-01-25 DIAGNOSIS — Z7984 Long term (current) use of oral hypoglycemic drugs: Secondary | ICD-10-CM

## 2017-01-25 DIAGNOSIS — E119 Type 2 diabetes mellitus without complications: Secondary | ICD-10-CM | POA: Diagnosis present

## 2017-01-25 DIAGNOSIS — Z90710 Acquired absence of both cervix and uterus: Secondary | ICD-10-CM

## 2017-01-25 DIAGNOSIS — Z86711 Personal history of pulmonary embolism: Secondary | ICD-10-CM

## 2017-01-25 DIAGNOSIS — E876 Hypokalemia: Secondary | ICD-10-CM | POA: Diagnosis present

## 2017-01-25 DIAGNOSIS — J45909 Unspecified asthma, uncomplicated: Secondary | ICD-10-CM | POA: Diagnosis present

## 2017-01-25 DIAGNOSIS — R7989 Other specified abnormal findings of blood chemistry: Secondary | ICD-10-CM

## 2017-01-25 DIAGNOSIS — E669 Obesity, unspecified: Secondary | ICD-10-CM | POA: Diagnosis present

## 2017-01-25 DIAGNOSIS — Z7982 Long term (current) use of aspirin: Secondary | ICD-10-CM

## 2017-01-25 LAB — BASIC METABOLIC PANEL
Anion gap: 11 (ref 5–15)
BUN: 10 mg/dL (ref 6–20)
CO2: 23 mmol/L (ref 22–32)
Calcium: 9.7 mg/dL (ref 8.9–10.3)
Chloride: 107 mmol/L (ref 101–111)
Creatinine, Ser: 1.18 mg/dL — ABNORMAL HIGH (ref 0.44–1.00)
GFR calc Af Amer: 57 mL/min — ABNORMAL LOW (ref >60)
GFR calc non Af Amer: 49 mL/min — ABNORMAL LOW (ref >60)
Glucose, Bld: 101 mg/dL — ABNORMAL HIGH (ref 65–99)
Potassium: 3.4 mmol/L — ABNORMAL LOW (ref 3.5–5.1)
Sodium: 141 mmol/L (ref 135–145)

## 2017-01-25 LAB — I-STAT TROPONIN, ED: Troponin i, poc: 0.09 ng/mL (ref 0.00–0.08)

## 2017-01-25 LAB — CBC
HCT: 41 % (ref 36.0–46.0)
Hemoglobin: 13.7 g/dL (ref 12.0–15.0)
MCH: 30.2 pg (ref 26.0–34.0)
MCHC: 33.4 g/dL (ref 30.0–36.0)
MCV: 90.5 fL (ref 78.0–100.0)
PLATELETS: 279 10*3/uL (ref 150–400)
RBC: 4.53 MIL/uL (ref 3.87–5.11)
RDW: 13.3 % (ref 11.5–15.5)
WBC: 7.5 10*3/uL (ref 4.0–10.5)

## 2017-01-25 MED ORDER — POTASSIUM CHLORIDE CRYS ER 20 MEQ PO TBCR
40.0000 meq | EXTENDED_RELEASE_TABLET | Freq: Once | ORAL | Status: AC
  Administered 2017-01-25: 40 meq via ORAL
  Filled 2017-01-25: qty 2

## 2017-01-25 MED ORDER — HEPARIN (PORCINE) IN NACL 100-0.45 UNIT/ML-% IJ SOLN
1200.0000 [IU]/h | INTRAMUSCULAR | Status: DC
  Administered 2017-01-25: 1200 [IU]/h via INTRAVENOUS
  Filled 2017-01-25: qty 250

## 2017-01-25 MED ORDER — HEPARIN BOLUS VIA INFUSION
5000.0000 [IU] | Freq: Once | INTRAVENOUS | Status: DC
  Filled 2017-01-25: qty 5000

## 2017-01-25 MED ORDER — IOPAMIDOL (ISOVUE-370) INJECTION 76%
INTRAVENOUS | Status: AC
  Administered 2017-01-25: 80 mL
  Filled 2017-01-25: qty 100

## 2017-01-25 MED ORDER — HEPARIN BOLUS VIA INFUSION
4500.0000 [IU] | Freq: Once | INTRAVENOUS | Status: AC
  Administered 2017-01-25: 4500 [IU] via INTRAVENOUS
  Filled 2017-01-25: qty 4500

## 2017-01-25 MED ORDER — MORPHINE SULFATE (PF) 4 MG/ML IV SOLN
4.0000 mg | Freq: Once | INTRAVENOUS | Status: AC
  Administered 2017-01-25: 4 mg via INTRAVENOUS
  Filled 2017-01-25: qty 1

## 2017-01-25 NOTE — ED Triage Notes (Signed)
Pt complaining of L sided chest pain and SOB x 2 weeks. Pt states sent here by Terrence Dupont, MD to rule out PE. Pt states hx of same. Pt denies any cough.

## 2017-01-25 NOTE — Progress Notes (Signed)
ANTICOAGULATION CONSULT NOTE - Initial Consult  Pharmacy Consult for heparin Indication: pulmonary embolus  No Known Allergies  Patient Measurements: Height: 5\' 3"  (160 cm) Weight: 165 lb (74.8 kg) IBW/kg (Calculated) : 52.4 Heparin Dosing Weight: 68.3   Vital Signs: Temp: 98.5 F (36.9 C) (06/15 1920) Temp Source: Oral (06/15 1920) BP: 149/95 (06/15 2115) Pulse Rate: 92 (06/15 2115)  Labs:  Recent Labs  01/25/17 1928  HGB 13.7  HCT 41.0  PLT 279  CREATININE 1.18*    Estimated Creatinine Clearance: 49.8 mL/min (A) (by C-G formula based on SCr of 1.18 mg/dL (H)).   Medical History: Past Medical History:  Diagnosis Date  . Acute bronchitis   . Acute maxillary sinusitis   . Allergic rhinitis   . Dysmetabolic syndrome    History of  . Essential hypertension, malignant   . Extrinsic asthma, unspecified   . Fibroids   . H/O: GI bleed   . History of arteritis   . Ischemic colitis, enteritis, or enterocolitis ([place])    Hisotry of  . Obesity, unspecified   . Otitis media of both ears   . Sinusitis   . Type II or unspecified type diabetes mellitus without mention of complication, not stated as uncontrolled    oral meds-insulin resistance  . Undifferentiated connective tissue disease ([facility])    History of    Medications:  Infusions:  . heparin      Assessment: 52 yof presented to the ED with SOB and CP. Found to have bilateral PE with right heart strain and large clot burden. To start IV heparin. Baseline CBC is WNL. She is currently not on anticoagulation PTA but she has been on warfarin/lovenox in the past.   Goal of Therapy:  Heparin level 0.3-0.7 units/ml Monitor platelets by anticoagulation protocol: Yes   Plan:  Heparin bolus 4500 units IV x 1 Heparin gtt 1200 units/hr Check a 6 hr heparin level Daily heparin level and CBC  Amil Moseman, Rande Lawman 01/25/2017,10:43 PM

## 2017-01-25 NOTE — ED Provider Notes (Signed)
[facility] DEPT Provider Note   CSN: [id number] Arrival date & time: 01/25/17  1850     History   Chief Complaint Chief Complaint  Patient presents with  . Chest Pain  . Shortness of Breath    HPI [name] is a 61 y.o. female.Complains of anterior pleuritic chest pain radiating to the back onset 2 weeks ago constant accompanied by shortness of breath. Pain is also worse with exertion. She was seen by Drs.Cheryll Cockayne and Dean earlier today sent here for evaluation of pulmonary embolism and further evaluation pain is worse with exertion changing positions or deep inspiration not improved by anything. No treatment prior to coming here  HPI  Past Medical History:  Diagnosis Date  . Acute bronchitis   . Acute maxillary sinusitis   . Allergic rhinitis   . Dysmetabolic syndrome    History of  . Essential hypertension, malignant   . Extrinsic asthma, unspecified   . Fibroids   . H/O: GI bleed   . History of arteritis   . Ischemic colitis, enteritis, or enterocolitis ([place])    Hisotry of  . Obesity, unspecified   . Otitis media of both ears   . Sinusitis   . Type II or unspecified type diabetes mellitus without mention of complication, not stated as uncontrolled    oral meds-insulin resistance  . Undifferentiated connective tissue disease ([place])    History of    Patient Active Problem List   Diagnosis Date Noted  . Dyspnea on exertion 06/28/2011  . Constipation, acute 06/26/2011  . Pulmonary embolism ([place]) 06/25/2011  . HTN (hypertension) 06/25/2011  . DM (diabetes mellitus) ([place]) 06/25/2011  . Connective tissue disorder ([place]) 06/25/2011    Past Surgical History:  Procedure Laterality Date  . ABDOMINAL HYSTERECTOMY  2000  . BREAST EXCISIONAL BIOPSY Left 1978  . CARDIAC CATHETERIZATION  09/25/05  . COLONOSCOPY W/ BIOPSIES      OB History    No data available       Home Medications    Prior to Admission medications   Medication Sig Start Date End Date  Taking? Authorizing Provider  aspirin 325 MG tablet Take 325 mg by mouth as needed.     [provider]  Azelastine HCl 0.15 % SOLN Place 2 sprays into both nostrils daily. For allergies Patient not taking: Reported on 01/02/2016 08/08/15   Shawnee Knapp, MD  Biotin 1000 MCG tablet Take 1,000 mcg by mouth at bedtime.     [provider]  cholecalciferol (VITAMIN D) 1000 UNITS tablet Take 1,000 Units by mouth daily.    [provider]  cyclobenzaprine (FLEXERIL) 5 MG tablet Take 5 mg by mouth 2 (two) times daily as needed for muscle spasms.     [provider]  diltiazem (CARDIZEM CD) 360 MG 24 hr capsule Take 360 mg by mouth daily. 11/27/14   [provider]  fluticasone (FLONASE) 50 MCG/ACT nasal spray Place 2 sprays into both nostrils daily. Reported on 08/08/2015 08/08/15   Shawnee Knapp, MD  fluticasone (FLOVENT HFA) 44 MCG/ACT inhaler Inhale 2 puffs into the lungs 2 (two) times daily. 06/29/11 12/10/14  Rogers Blocker, MD  gabapentin (NEURONTIN) 300 MG capsule Take 300 mg by mouth at bedtime.     [provider]  losartan-hydrochlorothiazide (HYZAAR) 100-12.5 MG per tablet Take 1 tablet by mouth every evening. 11/17/14   [provider]  metFORMIN (GLUCOPHAGE) 500 MG tablet Take 500 mg by mouth every evening.  [provider]  montelukast (SINGULAIR) 10 MG tablet Take 10 mg by mouth daily. 11/10/14   [provider]  oxymetazoline (AFRIN NASAL SPRAY) 0.05 % nasal spray Place 1 spray into both nostrils 2 (two) times daily. FOR NO LONGER THAN 3 DAYS AT A STRETCH 08/08/15   Shawnee Knapp, MD  tetrahydrozoline-zinc (VISINE-AC) 0.05-0.25 % ophthalmic solution Place 2 drops into both eyes 3 (three) times daily as needed. Reported on 08/08/2015    [provider]  XOPENEX HFA 45 MCG/ACT inhaler Inhale 1 puff into the lungs 2 (two) times daily. Reported on 08/08/2015 12/06/14   [provider]    Family  History Family History  Problem Relation Age of Onset  . Kidney disease Mother     Social History Social History  Substance Use Topics  . Smoking status: Never Smoker  . Smokeless tobacco: Never Used  . Alcohol use No     Allergies   Patient has no known allergies.   Review of Systems Review of Systems  Constitutional: Negative.   HENT: Negative.   Respiratory: Positive for shortness of breath.   Cardiovascular: Positive for chest pain.  Gastrointestinal: Negative.   Musculoskeletal: Negative.   Skin: Negative.   Neurological: Negative.   Psychiatric/Behavioral: Negative.   All other systems reviewed and are negative.    Physical Exam Updated Vital Signs BP (!) 155/104   Pulse (!) 103   Temp 98.5 F (36.9 C) (Oral)   Resp 18   Ht 5\' 3"  (1.6 m)   Wt 74.8 kg (165 lb)   SpO2 95%   BMI 29.23 kg/m   Physical Exam  Constitutional: She appears well-developed and well-nourished.  HENT:  Head: Normocephalic and atraumatic.  Eyes: Conjunctivae are normal. Pupils are equal, round, and reactive to light.  Neck: Neck supple. No tracheal deviation present. No thyromegaly present.  Cardiovascular: Normal rate and regular rhythm.   No murmur heard. Pulmonary/Chest: Effort normal and breath sounds normal.  Chest mildly tender anteriorly  Abdominal: Soft. Bowel sounds are normal. She exhibits no distension. There is no tenderness.  Musculoskeletal: Normal range of motion. She exhibits no edema or tenderness.  Neurological: She is alert. Coordination normal.  Skin: Skin is warm and dry. No rash noted.  Psychiatric: She has a normal mood and affect.  Nursing note and vitals reviewed.    ED Treatments / Results  Labs (all labs ordered are listed, but only abnormal results are displayed) Labs Reviewed  BASIC METABOLIC PANEL - Abnormal; Notable for the following:       Result Value   Potassium 3.4 (*)    Glucose, Bld 101 (*)    Creatinine, Ser 1.18 (*)    GFR calc  non Af Amer 49 (*)    GFR calc Af Amer 57 (*)    All other components within normal limits  I-STAT TROPOININ, ED - Abnormal; Notable for the following:    Troponin i, poc 0.09 (*)    All other components within normal limits  CBC    EKG  EKG Interpretation  Date/Time:  Friday January 25 2017 19:23:02 EDT Ventricular Rate:  102 PR Interval:  150 QRS Duration: 82 QT Interval:  396 QTC Calculation: 516 R Axis:   74 Text Interpretation:  Sinus tachycardia Nonspecific T wave abnormality Abnormal ECG No significant change since last tracing Confirmed by Orlie Dakin [phone]) on 01/25/2017 9:02:33 PM      Chest x-ray viewed by me Radiology Dg Chest 2 View  Result Date: 01/25/2017 CLINICAL DATA:  Left chest pain and shortness of breath for 2 weeks. History of pulmonary embolus EXAM: CHEST  2 VIEW COMPARISON:  06/02/2015 chest radiograph FINDINGS: The heart size and mediastinal contours are within normal limits. Both lungs are clear. The visualized skeletal structures are unremarkable. IMPRESSION: No significant radiographic abnormality is identified. Electronically Signed   By: Van Clines M.D.   On: 01/25/2017 20:02   Results for orders placed or performed during the hospital encounter of 93/26/71  Basic metabolic panel  Result Value Ref Range   Sodium 141 135 - 145 mmol/L   Potassium 3.4 (L) 3.5 - 5.1 mmol/L   Chloride 107 101 - 111 mmol/L   CO2 23 22 - 32 mmol/L   Glucose, Bld 101 (H) 65 - 99 mg/dL   BUN 10 6 - 20 mg/dL   Creatinine, Ser 1.18 (H) 0.44 - 1.00 mg/dL   Calcium 9.7 8.9 - 10.3 mg/dL   GFR calc non Af Amer 49 (L) >60 mL/min   GFR calc Af Amer 57 (L) >60 mL/min   Anion gap 11 5 - 15  CBC  Result Value Ref Range   WBC 7.5 4.0 - 10.5 K/uL   RBC 4.53 3.87 - 5.11 MIL/uL   Hemoglobin 13.7 12.0 - 15.0 g/dL   HCT 41.0 36.0 - 46.0 %   MCV 90.5 78.0 - 100.0 fL   MCH 30.2 26.0 - 34.0 pg   MCHC 33.4 30.0 - 36.0 g/dL   RDW 13.3 11.5 - 15.5 %   Platelets 279 150 -  400 K/uL  I-stat troponin, ED  Result Value Ref Range   Troponin i, poc 0.09 (HH) 0.00 - 0.08 ng/mL   Comment NOTIFIED PHYSICIAN    Comment 3           Dg Chest 2 View  Result Date: 01/25/2017 CLINICAL DATA:  Left chest pain and shortness of breath for 2 weeks. History of pulmonary embolus EXAM: CHEST  2 VIEW COMPARISON:  06/02/2015 chest radiograph FINDINGS: The heart size and mediastinal contours are within normal limits. Both lungs are clear. The visualized skeletal structures are unremarkable. IMPRESSION: No significant radiographic abnormality is identified. Electronically Signed   By: Van Clines M.D.   On: 01/25/2017 20:02   Ct Angio Chest Pe W Or Wo Contrast  Result Date: 01/25/2017 CLINICAL DATA:  Shortness of breath. EXAM: CT ANGIOGRAPHY CHEST WITH CONTRAST TECHNIQUE: Multidetector CT imaging of the chest was performed using the standard protocol during bolus administration of intravenous contrast. Multiplanar CT image reconstructions and MIPs were obtained to evaluate the vascular anatomy. CONTRAST:  75 cc Isovue 370 intravenously. COMPARISON:  Chest radiograph 01/25/2017 FINDINGS: Cardiovascular: Satisfactory opacification of the pulmonary arteries to the segmental level. There are bilateral pulmonary emboli at the level of the bifurcation of the lobar pulmonary arterial branches, and extending to all of the lobar and segmental branches. There is a large clot burden. RV/LV ratio equals 1.6. Mediastinum/Nodes: No enlarged mediastinal, hilar, or axillary lymph nodes. Thyroid gland, trachea, and esophagus demonstrate no significant findings. Lungs/Pleura: Lungs are clear. No pleural effusion or pneumothorax. Upper Abdomen: No acute abnormality. Musculoskeletal: No chest wall abnormality. No acute or significant osseous findings. Review of the MIP images confirms the above findings. IMPRESSION: Bilateral pulmonary emboli at the confluence of the bilateral lobar pulmonary arterial  branches, extending to all of the lobar and segmental branches. Large clot burden with critically increased RV/LV ratio of 1.6, suggestive of right heart strain.  These results were called by telephone at the time of interpretation on 01/25/2017 at 10:21 pm to Dr. Orlie Dakin , who verbally acknowledged these results. Electronically Signed   By: Fidela Salisbury M.D.   On: 01/25/2017 22:23   Procedures Procedures (including critical care time)  Medications Ordered in ED Medications - No data to display  9:50 PM pain improved and controlled after treatment with intravenous morphine Initial Impression / Assessment and Plan / ED Course  I have reviewed the triage vital signs and the nursing notes.  Pertinent labs & imaging results that were available during my care of the patient were reviewed by me and considered in my medical decision making (see chart for details).     Intravenous Heparin per pharmacy protocol ordered by me. Dr. Eulas Post from hospital service consulted and will arrange for admission. Patient received oral potassium supplementation here  Final Clinical Impressions(s) / ED Diagnoses  Diagnosis #1 bilateral pulmonary emboli #2 hypokalemia Final diagnoses:  None   CRITICAL CARE Performed by: Orlie Dakin Total critical care time: 30 minutes Critical care time was exclusive of separately billable procedures and treating other patients. Critical care was necessary to treat or prevent imminent or life-threatening deterioration. Critical care was time spent personally by me on the following activities: development of treatment plan with patient and/or surrogate as well as nursing, discussions with consultants, evaluation of patient's response to treatment, examination of patient, obtaining history from patient or surrogate, ordering and performing treatments and interventions, ordering and review of laboratory studies, ordering and review of radiographic studies, pulse  oximetry and re-evaluation of patient's condition. New Prescriptions New Prescriptions   No medications on file     Orlie Dakin, MD 01/25/17 2336

## 2017-01-26 ENCOUNTER — Observation Stay (HOSPITAL_BASED_OUTPATIENT_CLINIC_OR_DEPARTMENT_OTHER): Payer: BC Managed Care – PPO

## 2017-01-26 ENCOUNTER — Observation Stay (HOSPITAL_COMMUNITY): Payer: BC Managed Care – PPO

## 2017-01-26 DIAGNOSIS — Z841 Family history of disorders of kidney and ureter: Secondary | ICD-10-CM | POA: Diagnosis not present

## 2017-01-26 DIAGNOSIS — E669 Obesity, unspecified: Secondary | ICD-10-CM | POA: Diagnosis present

## 2017-01-26 DIAGNOSIS — E119 Type 2 diabetes mellitus without complications: Secondary | ICD-10-CM

## 2017-01-26 DIAGNOSIS — E876 Hypokalemia: Secondary | ICD-10-CM | POA: Diagnosis present

## 2017-01-26 DIAGNOSIS — R0609 Other forms of dyspnea: Secondary | ICD-10-CM

## 2017-01-26 DIAGNOSIS — R7989 Other specified abnormal findings of blood chemistry: Secondary | ICD-10-CM

## 2017-01-26 DIAGNOSIS — Z794 Long term (current) use of insulin: Secondary | ICD-10-CM

## 2017-01-26 DIAGNOSIS — Z7982 Long term (current) use of aspirin: Secondary | ICD-10-CM | POA: Diagnosis not present

## 2017-01-26 DIAGNOSIS — Z7984 Long term (current) use of oral hypoglycemic drugs: Secondary | ICD-10-CM | POA: Diagnosis not present

## 2017-01-26 DIAGNOSIS — I361 Nonrheumatic tricuspid (valve) insufficiency: Secondary | ICD-10-CM

## 2017-01-26 DIAGNOSIS — R778 Other specified abnormalities of plasma proteins: Secondary | ICD-10-CM

## 2017-01-26 DIAGNOSIS — I10 Essential (primary) hypertension: Secondary | ICD-10-CM

## 2017-01-26 DIAGNOSIS — R748 Abnormal levels of other serum enzymes: Secondary | ICD-10-CM | POA: Diagnosis not present

## 2017-01-26 DIAGNOSIS — N179 Acute kidney failure, unspecified: Secondary | ICD-10-CM | POA: Diagnosis present

## 2017-01-26 DIAGNOSIS — Z6829 Body mass index (BMI) 29.0-29.9, adult: Secondary | ICD-10-CM | POA: Diagnosis not present

## 2017-01-26 DIAGNOSIS — J45909 Unspecified asthma, uncomplicated: Secondary | ICD-10-CM | POA: Diagnosis present

## 2017-01-26 DIAGNOSIS — I2699 Other pulmonary embolism without acute cor pulmonale: Secondary | ICD-10-CM | POA: Diagnosis present

## 2017-01-26 DIAGNOSIS — Z86711 Personal history of pulmonary embolism: Secondary | ICD-10-CM | POA: Diagnosis not present

## 2017-01-26 DIAGNOSIS — Z90710 Acquired absence of both cervix and uterus: Secondary | ICD-10-CM | POA: Diagnosis not present

## 2017-01-26 DIAGNOSIS — Z7951 Long term (current) use of inhaled steroids: Secondary | ICD-10-CM | POA: Diagnosis not present

## 2017-01-26 LAB — ECHOCARDIOGRAM COMPLETE
Height: 63 in
Weight: 2596.8 oz

## 2017-01-26 LAB — BASIC METABOLIC PANEL
Anion gap: 10 (ref 5–15)
BUN: 10 mg/dL (ref 6–20)
CHLORIDE: 108 mmol/L (ref 101–111)
CO2: 21 mmol/L — ABNORMAL LOW (ref 22–32)
CREATININE: 1.12 mg/dL — AB (ref 0.44–1.00)
Calcium: 9.3 mg/dL (ref 8.9–10.3)
GFR calc Af Amer: >60 mL/min (ref >60)
GFR calc non Af Amer: 53 mL/min — ABNORMAL LOW (ref >60)
GLUCOSE: 98 mg/dL (ref 65–99)
POTASSIUM: 4.8 mmol/L (ref 3.5–5.1)
Sodium: 139 mmol/L (ref 135–145)

## 2017-01-26 LAB — HEPARIN LEVEL (UNFRACTIONATED)
HEPARIN UNFRACTIONATED: 1.04 [IU]/mL — AB (ref 0.30–0.70)
Heparin Unfractionated: 0.6 IU/mL (ref 0.30–0.70)
Heparin Unfractionated: 0.57 IU/mL (ref 0.30–0.70)

## 2017-01-26 LAB — GLUCOSE, CAPILLARY
GLUCOSE-CAPILLARY: 108 mg/dL — AB (ref 65–99)
GLUCOSE-CAPILLARY: 137 mg/dL — AB (ref 65–99)
GLUCOSE-CAPILLARY: 125 mg/dL — AB (ref 65–99)
Glucose-Capillary: 101 mg/dL — ABNORMAL HIGH (ref 65–99)

## 2017-01-26 LAB — CBC
HCT: 40.2 % (ref 36.0–46.0)
Hemoglobin: 13.5 g/dL (ref 12.0–15.0)
MCH: 30.5 pg (ref 26.0–34.0)
MCHC: 33.6 g/dL (ref 30.0–36.0)
MCV: 90.7 fL (ref 78.0–100.0)
PLATELETS: 292 10*3/uL (ref 150–400)
RBC: 4.43 MIL/uL (ref 3.87–5.11)
RDW: 13.2 % (ref 11.5–15.5)
WBC: 7.6 10*3/uL (ref 4.0–10.5)

## 2017-01-26 LAB — TROPONIN I
TROPONIN I: 0.06 ng/mL — AB (ref <0.03)
Troponin I: 0.06 ng/mL (ref <0.03)
Troponin I: 0.04 ng/mL (ref <0.03)

## 2017-01-26 LAB — MRSA PCR SCREENING: MRSA BY PCR: NEGATIVE

## 2017-01-26 LAB — BRAIN NATRIURETIC PEPTIDE: B NATRIURETIC PEPTIDE 5: 417.7 pg/mL — AB (ref 0.0–100.0)

## 2017-01-26 MED ORDER — GABAPENTIN 300 MG PO CAPS
300.0000 mg | ORAL_CAPSULE | Freq: Every day | ORAL | Status: DC
  Administered 2017-01-26 – 2017-01-31 (×6): 300 mg via ORAL
  Filled 2017-01-26 (×6): qty 1

## 2017-01-26 MED ORDER — MONTELUKAST SODIUM 10 MG PO TABS
10.0000 mg | ORAL_TABLET | Freq: Every day | ORAL | Status: DC
  Administered 2017-01-26 – 2017-01-31 (×6): 10 mg via ORAL
  Filled 2017-01-26 (×6): qty 1

## 2017-01-26 MED ORDER — INSULIN ASPART 100 UNIT/ML ~~LOC~~ SOLN
0.0000 [IU] | Freq: Three times a day (TID) | SUBCUTANEOUS | Status: DC
  Administered 2017-01-26: 1 [IU] via SUBCUTANEOUS
  Administered 2017-01-28: 2 [IU] via SUBCUTANEOUS

## 2017-01-26 MED ORDER — LEVALBUTEROL TARTRATE 45 MCG/ACT IN AERO: 1.0000 | INHALATION_SPRAY | Freq: Four times a day (QID) | RESPIRATORY_TRACT | Status: DC | PRN

## 2017-01-26 MED ORDER — ONDANSETRON HCL 4 MG PO TABS: 4.0000 mg | ORAL_TABLET | Freq: Four times a day (QID) | ORAL | Status: DC | PRN

## 2017-01-26 MED ORDER — DILTIAZEM HCL ER COATED BEADS 180 MG PO CP24
360.0000 mg | ORAL_CAPSULE | Freq: Every day | ORAL | Status: DC
  Administered 2017-01-26 – 2017-02-01 (×7): 360 mg via ORAL
  Filled 2017-01-26 (×7): qty 2

## 2017-01-26 MED ORDER — POTASSIUM CHLORIDE CRYS ER 20 MEQ PO TBCR
40.0000 meq | EXTENDED_RELEASE_TABLET | Freq: Once | ORAL | Status: AC
  Administered 2017-01-26: 40 meq via ORAL
  Filled 2017-01-26: qty 2

## 2017-01-26 MED ORDER — SODIUM CHLORIDE 0.9% FLUSH
3.0000 mL | Freq: Two times a day (BID) | INTRAVENOUS | Status: DC
  Administered 2017-01-26 – 2017-02-01 (×13): 3 mL via INTRAVENOUS

## 2017-01-26 MED ORDER — POTASSIUM CHLORIDE CRYS ER 20 MEQ PO TBCR: 40.0000 meq | EXTENDED_RELEASE_TABLET | Freq: Once | ORAL | Status: DC

## 2017-01-26 MED ORDER — LEVALBUTEROL HCL 0.63 MG/3ML IN NEBU: 0.6300 mg | INHALATION_SOLUTION | Freq: Four times a day (QID) | RESPIRATORY_TRACT | Status: DC | PRN

## 2017-01-26 MED ORDER — MORPHINE SULFATE (PF) 4 MG/ML IV SOLN
4.0000 mg | INTRAVENOUS | Status: DC | PRN
  Administered 2017-01-26 (×4): 4 mg via INTRAVENOUS
  Filled 2017-01-26 (×4): qty 1

## 2017-01-26 MED ORDER — ACETAMINOPHEN 325 MG PO TABS
650.0000 mg | ORAL_TABLET | Freq: Four times a day (QID) | ORAL | Status: DC | PRN
  Administered 2017-01-30 – 2017-01-31 (×2): 650 mg via ORAL
  Filled 2017-01-26 (×2): qty 2

## 2017-01-26 MED ORDER — ONDANSETRON HCL 4 MG/2ML IJ SOLN: 4.0000 mg | Freq: Four times a day (QID) | INTRAMUSCULAR | Status: DC | PRN

## 2017-01-26 MED ORDER — ACETAMINOPHEN 650 MG RE SUPP: 650.0000 mg | Freq: Four times a day (QID) | RECTAL | Status: DC | PRN

## 2017-01-26 MED ORDER — HEPARIN (PORCINE) IN NACL 100-0.45 UNIT/ML-% IJ SOLN
900.0000 [IU]/h | INTRAMUSCULAR | Status: DC
  Administered 2017-01-26 – 2017-01-28 (×2): 1000 [IU]/h via INTRAVENOUS
  Administered 2017-01-29 – 2017-01-30 (×2): 900 [IU]/h via INTRAVENOUS
  Filled 2017-01-26 (×4): qty 250

## 2017-01-26 NOTE — Progress Notes (Signed)
Brief Progress Note  77yoF with history of HTN, DM, Undifferentiated connective tissue disease, Asthma, PE (in 2012) treated with anticoag for 3 years, and remote GIB, Pt seen for new  bilateral PE in all lobes and overall high clot burden but no saddle embolus. Positive for mild RV strain on CT.RV/LV ratio of 1.6 Trop is mildly elevated at 0.09 , BNP is elevated at 417.7. She was started on Heparin gtt vs catheter directed TPA  due to sub acute nature of symptoms, ( suspicion the PE has been present x 2 weeks, and history of GI Bleed. If patient declines clinically, TPA can be considered at that time.   She is currently stable on 2 L [place] with saturations of   99%, clear breath sounds throughout, but dimished. She is able to speak in full sentences, and states her breathing is better than it was on admission. She does complain of chest pain. Heparin Gtt is infusing at 1000 units per hour. Pharmacy monitoring of heparin levels is ongoing.  Echo is ordered and pending,( Previous echo 06/2011 indicated EF: 55-60%,PAP Peak of 33 mmHg Preliminary finding of Lower extremity venous doppler studies 6/16>> : No evidence DVT or Baker's Cyst.   Plan: Continue Oxygen to maintain oxygen saturations> 94% Continue Heparin per pharmacy CBC daily, Trend HGB and Platelets Monitor for  acute bleeding Consider catheter directed TPA if pt. clinically deteriorates Serial Troponins CXR prn Will need transitioning to NOAC or Coumadin prior to DC. ( Pt. Wants this discussed with Dr. Marlou Sa, Her PCP) Will need Pulmonary follow up at discharge Will need ambulatory saturation at discharge  Did discuss with patient that she will need lifelong anticoagulation as this is second thromboembolic event.   Magdalen Spatz, AGACNP-BC [place] Pager # [phone], if no amswer then [phone] 01/25/2017

## 2017-01-26 NOTE — Progress Notes (Signed)
PROGRESS NOTE    [name]  TML:[id number] DOB: [birth year] DOA: 01/25/2017 PCP: Rogers Blocker, MD   Brief Narrative:  61 y.o. BF PMHx Essential HTN, DM type II controlled without complication, Occipital neuralgia and chronic headaches, and prior PE (spontaneous, diagnosed in 2012, does not recall hypercoag work-up, treated with warfarin for three years),Undifferentiated connective tissue disease, Dysmetabolic syndrome  Who presents to the ED for evaluation of two weeks of shortness of breath with exertion and pleuritic chest pain.  No light-headedness or dizziness.  No near syncope.  No nausea, vomiting.  She has chronic ankle edema.  She has not had any calf pain or tenderness.  No leg swelling.  She has had bilateral thigh pain.  No prolonged periods of immobility.  ED Course: CTA chest shows "Bilateral pulmonary emboli at the confluence of the bilateral lobar pulmonary arterial branches, extending to all of the lobar and segmental branches.  Large clot burden with critically increased RV/LV ratio of 1.6, suggestive of right heart strain."  She has been started on a heparin drip per protocol.  Hospitalist asked to admit.   Subjective: 6/16  A/O 4, positive SOB, positive CP, negative lower extremity pain. States neck and jaw pain has resolved. Does not recall if she's had hypercoagulability workup with previous PE. States previous PE unprovoked. Current PE unprovoked i.e. negative long trips, negative injury. Per patient negative family history of hypercoagulability.    Assessment & Plan:   Principal Problem:   Bilateral pulmonary embolism (HCC) Active Problems:   HTN (hypertension)   DM (diabetes mellitus) ([place])   DOE (dyspnea on exertion)   Elevated troponin   Acute bilateral PE; submassive;  high risk.  DOE noted in the ED with minimal exertion. --PCCM consult appreciated.  No lytics but she is high risk. --Anticoagulate with IV heparin for now --Patient concerned about  being on NOACs; says that her PCP did not want her using these medications in the past.  She is requesting that someone speak with her PCP Dr. Marlou Sa before discharge re: anticoagulation options. --Bilateral thigh pain: will check venous doppler --complete echo --Serial troponin --Bed rest for now  HTN --Hold losartan HCTZ now in case she has relative hypotension; continue cardizem.  DM --Hold metformin.  Low dose sliding insulin   DVT prophylaxis: Heparin drip Code Status: Full Family Communication: None Disposition Plan: Expect discharge home in 72-96 hours after Coumadin started   Consultants:  None  Procedures/Significant Events:  6/15 CT image echocardiogram PE protocol; -Bilateral pulmonary emboli at the confluence of the bilateral lobar pulmonary arterial branches, extending to all of the lobar and segmental branches.  Large clot burden with critically increased RV/LV ratio of 1.6, suggestive of right heart strain.   VENTILATOR SETTINGS: None   Cultures None  Antimicrobials: Anti-infectives    None       Devices None   LINES / TUBES:  None    Continuous Infusions: . heparin 1,000 Units/hr (01/26/17 0715)     Objective: Vitals:   01/26/17 0030 01/26/17 0141 01/26/17 0147 01/26/17 0420  BP: (!) 131/119 (!) 144/95  (!) 130/93  Pulse: 99 92  79  Resp: (!) 25 15  13   Temp:  98 F (36.7 C)    TempSrc:  Oral    SpO2: 98% 97%  98%  Weight:   162 lb 4.8 oz (73.6 kg)   Height:  5\' 3"  (1.6 m)      Intake/Output Summary (Last 24 hours) at 01/26/17  0092 Last data filed at 01/26/17 0617  Gross per 24 hour  Intake              326 ml  Output                0 ml  Net              326 ml   Filed Weights   01/25/17 1920 01/26/17 0147  Weight: 165 lb (74.8 kg) 162 lb 4.8 oz (73.6 kg)    Examination:  General: A/O 4, positive acute respiratory distress Eyes: negative scleral hemorrhage, negative anisocoria, negative icterus ENT: Negative  Runny nose, negative gingival bleeding, Neck:  Negative scars, masses, torticollis, lymphadenopathy, JVD Lungs: Clear to auscultation bilaterally without wheezes or crackles Cardiovascular: Regular rate and rhythm without murmur gallop or rub normal S1 and S2 Abdomen: negative abdominal pain, nondistended, positive soft, bowel sounds, no rebound, no ascites, no appreciable mass Extremities: No significant cyanosis, clubbing, or edema bilateral lower extremities Skin: Negative rashes, lesions, ulcers Psychiatric:  Negative depression, negative anxiety, negative fatigue, negative mania  Central nervous system:  Cranial nerves II through XII intact, tongue/uvula midline, all extremities muscle strength 5/5, sensation intact throughout, negative dysarthria, negative expressive aphasia, negative receptive aphasia.  .     Data Reviewed: Care during the described time interval was provided by me .  I have reviewed this patient's available data, including medical history, events of note, physical examination, and all test results as part of my evaluation. I have personally reviewed and interpreted all radiology studies.  CBC:  Recent Labs Lab 01/25/17 1928 01/26/17 0211  WBC 7.5 7.6  HGB 13.7 13.5  HCT 41.0 40.2  MCV 90.5 90.7  PLT 279 330   Basic Metabolic Panel:  Recent Labs Lab 01/25/17 1928  NA 141  K 3.4*  CL 107  CO2 23  GLUCOSE 101*  BUN 10  CREATININE 1.18*  CALCIUM 9.7   GFR: Estimated Creatinine Clearance: 49.4 mL/min (A) (by C-G formula based on SCr of 1.18 mg/dL (H)). Liver Function Tests: No results for input(s): AST, ALT, ALKPHOS, BILITOT, PROT, ALBUMIN in the last 168 hours. No results for input(s): LIPASE, AMYLASE in the last 168 hours. No results for input(s): AMMONIA in the last 168 hours. Coagulation Profile: No results for input(s): INR, PROTIME in the last 168 hours. Cardiac Enzymes:  Recent Labs Lab 01/26/17 0211  TROPONINI 0.06*   BNP (last 3  results) No results for input(s): PROBNP in the last 8760 hours. HbA1C: No results for input(s): HGBA1C in the last 72 hours. CBG:  Recent Labs Lab 01/26/17 0721  GLUCAP 101*   Lipid Profile: No results for input(s): CHOL, HDL, LDLCALC, TRIG, CHOLHDL, LDLDIRECT in the last 72 hours. Thyroid Function Tests: No results for input(s): TSH, T4TOTAL, FREET4, T3FREE, THYROIDAB in the last 72 hours. Anemia Panel: No results for input(s): VITAMINB12, FOLATE, FERRITIN, TIBC, IRON, RETICCTPCT in the last 72 hours. Urine analysis: No results found for: COLORURINE, APPEARANCEUR, LABSPEC, PHURINE, GLUCOSEU, HGBUR, BILIRUBINUR, KETONESUR, PROTEINUR, UROBILINOGEN, NITRITE, LEUKOCYTESUR Sepsis Labs: @LABRCNTIP (procalcitonin:4,lacticidven:4)  ) Recent Results (from the past 240 hour(s))  MRSA PCR Screening     Status: None   Collection Time: 01/26/17  1:51 AM  Result Value Ref Range Status   MRSA by PCR NEGATIVE NEGATIVE Final    Comment:        The GeneXpert MRSA Assay (FDA approved for NASAL specimens only), is one component of a comprehensive MRSA colonization surveillance program.  It is not intended to diagnose MRSA infection nor to guide or monitor treatment for MRSA infections.          Radiology Studies: Dg Chest 2 View  Result Date: 01/25/2017 CLINICAL DATA:  Left chest pain and shortness of breath for 2 weeks. History of pulmonary embolus EXAM: CHEST  2 VIEW COMPARISON:  06/02/2015 chest radiograph FINDINGS: The heart size and mediastinal contours are within normal limits. Both lungs are clear. The visualized skeletal structures are unremarkable. IMPRESSION: No significant radiographic abnormality is identified. Electronically Signed   By: Van Clines M.D.   On: 01/25/2017 20:02   Ct Angio Chest Pe W Or Wo Contrast  Result Date: 01/25/2017 CLINICAL DATA:  Shortness of breath. EXAM: CT ANGIOGRAPHY CHEST WITH CONTRAST TECHNIQUE: Multidetector CT imaging of the chest  was performed using the standard protocol during bolus administration of intravenous contrast. Multiplanar CT image reconstructions and MIPs were obtained to evaluate the vascular anatomy. CONTRAST:  75 cc Isovue 370 intravenously. COMPARISON:  Chest radiograph 01/25/2017 FINDINGS: Cardiovascular: Satisfactory opacification of the pulmonary arteries to the segmental level. There are bilateral pulmonary emboli at the level of the bifurcation of the lobar pulmonary arterial branches, and extending to all of the lobar and segmental branches. There is a large clot burden. RV/LV ratio equals 1.6. Mediastinum/Nodes: No enlarged mediastinal, hilar, or axillary lymph nodes. Thyroid gland, trachea, and esophagus demonstrate no significant findings. Lungs/Pleura: Lungs are clear. No pleural effusion or pneumothorax. Upper Abdomen: No acute abnormality. Musculoskeletal: No chest wall abnormality. No acute or significant osseous findings. Review of the MIP images confirms the above findings. IMPRESSION: Bilateral pulmonary emboli at the confluence of the bilateral lobar pulmonary arterial branches, extending to all of the lobar and segmental branches. Large clot burden with critically increased RV/LV ratio of 1.6, suggestive of right heart strain. These results were called by telephone at the time of interpretation on 01/25/2017 at 10:21 pm to Dr. Orlie Dakin , who verbally acknowledged these results. Electronically Signed   By: Fidela Salisbury M.D.   On: 01/25/2017 22:23        Scheduled Meds: . diltiazem  360 mg Oral Daily  . gabapentin  300 mg Oral QHS  . insulin aspart  0-9 Units Subcutaneous TID WC  . montelukast  10 mg Oral QHS  . sodium chloride flush  3 mL Intravenous Q12H   Continuous Infusions: . heparin 1,000 Units/hr (01/26/17 0715)     LOS: 0 days    Time spent: 40 minutes    WOODS, Geraldo Docker, MD [facility] Pager [phone]   If 7PM-7AM, please contact  night-coverage [email or web address] Password [facility] 01/26/2017, 7:39 AM

## 2017-01-26 NOTE — Progress Notes (Signed)
ANTICOAGULATION CONSULT NOTE - follow-up Consult  Pharmacy Consult for heparin Indication: pulmonary embolus  No Known Allergies  Patient Measurements: Height: 5\' 3"  (160 cm) Weight: 162 lb 4.8 oz (73.6 kg) IBW/kg (Calculated) : 52.4 Heparin Dosing Weight: 68.3   Vital Signs: Temp: 97.8 F (36.6 C) (06/16 1243) Temp Source: Axillary (06/16 1243) BP: 130/88 (06/16 1243) Pulse Rate: 79 (06/16 0420)  Labs:  Recent Labs  01/25/17 1928 01/26/17 0211 01/26/17 0521 01/26/17 0740 01/26/17 1250  HGB 13.7 13.5  --   --   --   HCT 41.0 40.2  --   --   --   PLT 279 292  --   --   --   HEPARINUNFRC  --   --  1.04*  --  0.60  CREATININE 1.18*  --   --  1.12*  --   TROPONINI  --  0.06*  --  0.06* 0.04*    Estimated Creatinine Clearance: 52 mL/min (A) (by C-G formula based on SCr of 1.12 mg/dL (H)).  Assessment: 40 yof on heparin for bilateral PE with right heart strain and large clot burden. Heparin level therapeutic (0.60) on gtt at 1000 units/hr. No bleeding noted and CBC stable.   Goal of Therapy:  Heparin level 0.3-0.7 units/ml Monitor platelets by anticoagulation protocol: Yes   Plan:  Continue heparin gtt at 1000 units/hr Verify 6 hr heparin level Heparin level and CBC daily  Monitor for s/s bleeding   Argie Ramming, PharmD Pharmacy Resident  Pager [phone] 01/26/17 2:53 PM

## 2017-01-26 NOTE — Consult Note (Signed)
Name: [name] MRN: [id number] DOB: 27-May-1956    ADMISSION DATE:  01/25/2017 CONSULTATION DATE: 01/26/17  REFERRING MD : Dr Eulas Post  CHIEF COMPLAINT:  PE   HISTORY OF PRESENT ILLNESS:  24yoF with history of HTN, DM, Undifferentiated connective tissue disease, Asthma, PE (in 2012) treated with anticoag for 3 years, GIB (per patient was 7-99yrs ago, not on anticoag at the time, source was the colon and stopped without intervention or blood transfusion; did require admission), now admitted with submassive PE. Patient presented to the ER today c/o CP and SOB x 2 weeks. She reports the CP is left-sided, constant but pleuritic in nature. The SOB is constant but worse with exertion. She also c/o BLE pain from knees to thighs. She said she finally came in today because her PCP told her she had to. She denies any immobility, bedrest, recent surgeries, long car/plane trips. She says she doesn't think she had any hypercoagulable workup after her first PE.   PAST MEDICAL HISTORY :   has a past medical history of Acute bronchitis; Acute maxillary sinusitis; Allergic rhinitis; Dysmetabolic syndrome; Essential hypertension, malignant; Extrinsic asthma, unspecified; Fibroids; H/O: GI bleed; History of arteritis; Ischemic colitis, enteritis, or enterocolitis ([place]); Obesity, unspecified; Otitis media of both ears; Sinusitis; Type II or unspecified type diabetes mellitus without mention of complication, not stated as uncontrolled; and Undifferentiated connective tissue disease ([place]).  has a past surgical history that includes Colonoscopy w/ biopsies; Abdominal hysterectomy (2000); Cardiac catheterization (09/25/05); and Breast excisional biopsy (Left, 1978). Prior to Admission medications   Medication Sig Start Date End Date Taking? Authorizing Provider  Biotin 1000 MCG tablet Take 1,000 mcg by mouth at bedtime.    Yes [provider]  cholecalciferol (VITAMIN D) 1000 UNITS tablet Take 1,000 Units by  mouth daily.   Yes [provider]  diltiazem (CARDIZEM CD) 360 MG 24 hr capsule Take 360 mg by mouth daily. 11/27/14  Yes [provider]  fluticasone (FLONASE) 50 MCG/ACT nasal spray Place 2 sprays into both nostrils daily. Reported on 08/08/2015 08/08/15  Yes Shawnee Knapp, MD  fluticasone (FLOVENT HFA) 44 MCG/ACT inhaler Inhale 2 puffs into the lungs 2 (two) times daily. Patient taking differently: Inhale 2 puffs into the lungs 2 (two) times daily as needed (for breathing).  06/29/11 01/25/17 Yes Rogers Blocker, MD  gabapentin (NEURONTIN) 300 MG capsule Take 300 mg by mouth at bedtime.    Yes [provider]  losartan-hydrochlorothiazide (HYZAAR) 100-12.5 MG per tablet Take 1 tablet by mouth every evening. 11/17/14  Yes [provider]  metFORMIN (GLUCOPHAGE) 500 MG tablet Take 500 mg by mouth every evening.    Yes [provider]  montelukast (SINGULAIR) 10 MG tablet Take 10 mg by mouth daily. 11/10/14  Yes [provider]  XOPENEX HFA 45 MCG/ACT inhaler Inhale 1-2 puffs into the lungs every 6 (six) hours as needed for wheezing. Reported on 08/08/2015 12/06/14  Yes [provider]  Azelastine HCl 0.15 % SOLN Place 2 sprays into both nostrils daily. For allergies Patient not taking: Reported on 01/02/2016 08/08/15   Shawnee Knapp, MD  oxymetazoline [facility] NASAL SPRAY) 0.05 % nasal spray Place 1 spray into both nostrils 2 (two) times daily. FOR NO LONGER THAN 3 DAYS AT A STRETCH Patient not taking: Reported on 01/25/2017 08/08/15   Shawnee Knapp, MD   No Known Allergies  FAMILY HISTORY:  family history includes Kidney disease in her mother. SOCIAL HISTORY:  reports  that she has never smoked. She has never used smokeless tobacco. She reports that she does not drink alcohol or use drugs.  REVIEW OF SYSTEMS:   Constitutional: Negative for fever, chills, weight loss, malaise/fatigue and diaphoresis.  HENT: Negative for hearing loss, ear pain,  nosebleeds, congestion, sore throat, neck pain, tinnitus and ear discharge.   Eyes: Negative for blurred vision, double vision, photophobia, pain, discharge and redness.  Respiratory: Positive for  shortness of breath. Negative for cough, hemoptysis, sputum production,wheezing and stridor.   Cardiovascular: Positive for chest pain. Negative for palpitations, orthopnea, claudication, leg swelling and PND.  Gastrointestinal: Negative for heartburn, nausea, vomiting, abdominal pain, diarrhea, constipation, blood in stool and melena.  Genitourinary: Negative for dysuria, urgency, frequency, hematuria and flank pain.  Musculoskeletal: Positive for Bilateral Leg pain. Negative for myalgias, back pain, joint pain and falls.  Skin: Negative for itching and rash.  Neurological: Negative for dizziness, tingling, tremors, sensory change, speech change, focal weakness, seizures, loss of consciousness, weakness and headaches.  Endo/Heme/Allergies: Negative for environmental allergies and polydipsia. Does not bruise/bleed easily.  VITAL SIGNS: Temp:  [98.5 F (36.9 C)] 98.5 F (36.9 C) (06/15 1920) Pulse Rate:  [87-103] 99 (06/16 0030) Resp:  [14-31] 25 (06/16 0030) BP: (131-161)/(81-119) 131/119 (06/16 0030) SpO2:  [93 %-100 %] 98 % (06/16 0030) Weight:  [74.8 kg (165 lb)] 74.8 kg (165 lb) (06/15 1920)  PHYSICAL EXAMINATION: General: WDWN Middle-aged AAF, lying on ER stretcher, in mild distress due to pain, no respiratory distress Neuro: AAOx3, moving all extremities HEENT: OP clear, MM moist, PERRL Cardiovascular: RRR, no m/r/g Lungs: CTA b/l, 98% on RA; RR mid-20's on my count Abdomen: soft NTND, BS normoactive Musculoskeletal: no LE edema; BLE grossly equal in size Skin: no rashes   Recent Labs Lab 01/25/17 1928  NA 141  K 3.4*  CL 107  CO2 23  BUN 10  CREATININE 1.18*  GLUCOSE 101*    Recent Labs Lab 01/25/17 1928  HGB 13.7  HCT 41.0  WBC 7.5  PLT 279   Dg Chest 2  View  Result Date: 01/25/2017 CLINICAL DATA:  Left chest pain and shortness of breath for 2 weeks. History of pulmonary embolus EXAM: CHEST  2 VIEW COMPARISON:  06/02/2015 chest radiograph FINDINGS: The heart size and mediastinal contours are within normal limits. Both lungs are clear. The visualized skeletal structures are unremarkable. IMPRESSION: No significant radiographic abnormality is identified. Electronically Signed   By: Van Clines M.D.   On: 01/25/2017 20:02   Ct Angio Chest Pe W Or Wo Contrast  Result Date: 01/25/2017 CLINICAL DATA:  Shortness of breath. EXAM: CT ANGIOGRAPHY CHEST WITH CONTRAST TECHNIQUE: Multidetector CT imaging of the chest was performed using the standard protocol during bolus administration of intravenous contrast. Multiplanar CT image reconstructions and MIPs were obtained to evaluate the vascular anatomy. CONTRAST:  75 cc Isovue 370 intravenously. COMPARISON:  Chest radiograph 01/25/2017 FINDINGS: Cardiovascular: Satisfactory opacification of the pulmonary arteries to the segmental level. There are bilateral pulmonary emboli at the level of the bifurcation of the lobar pulmonary arterial branches, and extending to all of the lobar and segmental branches. There is a large clot burden. RV/LV ratio equals 1.6. Mediastinum/Nodes: No enlarged mediastinal, hilar, or axillary lymph nodes. Thyroid gland, trachea, and esophagus demonstrate no significant findings. Lungs/Pleura: Lungs are clear. No pleural effusion or pneumothorax. Upper Abdomen: No acute abnormality. Musculoskeletal: No chest wall abnormality. No acute or significant osseous findings. Review of the MIP images confirms the above  findings. IMPRESSION: Bilateral pulmonary emboli at the confluence of the bilateral lobar pulmonary arterial branches, extending to all of the lobar and segmental branches. Large clot burden with critically increased RV/LV ratio of 1.6, suggestive of right heart strain. These results  were called by telephone at the time of interpretation on 01/25/2017 at 10:21 pm to Dr. Orlie Dakin , who verbally acknowledged these results. Electronically Signed   By: Fidela Salisbury M.D.   On: 01/25/2017 22:23    ASSESSMENT / PLAN: 44yoF with history of HTN, DM, Undifferentiated connective tissue disease, Asthma, PE (in 2012) treated with anticoag for 3 years, and remote GIB, now admitted with submassive PE. CTA Chest on my review shows bilateral PE in all lobes and overall high clot burden but no saddle embolus. There is mild RV strain on CT. Trop is mildly elevated at 0.09 which also supports this. Will check BNP; expect it will be high as well. Will obtain BLE Dopplers. Continue heparin gtt. Pain control PRN for CP. Regarding the Submassive PE, there is not strong evidence based medicine in favor of use of catheter directed TPA, but in patients with high clot burden, sometimes this is still attempted to reduce risk of developing CTEPH. Given the subacute nature of patient's symptoms, I suspect the PE has been present for the past 2 weeks. Therefore at this time I am less inclined to recommend catheter directed TPA, especially given her history of GIB. Certainly if her clinical condition deteriorates we will re-evaluate this decision. Agree with ER that this patient is appropriate for step-down floor; Pulmonary will continue to follow.   Pulmonary and [place] Pager: [phone]  60 minutes critical care time   01/26/2017, 1:28 AM

## 2017-01-26 NOTE — Progress Notes (Signed)
ANTICOAGULATION CONSULT NOTE - follow-up Consult  Pharmacy Consult for heparin Indication: pulmonary embolus  No Known Allergies  Patient Measurements: Height: 5\' 3"  (160 cm) Weight: 162 lb 4.8 oz (73.6 kg) IBW/kg (Calculated) : 52.4 Heparin Dosing Weight: 68.3   Vital Signs: Temp: 98 F (36.7 C) (06/16 0141) Temp Source: Oral (06/16 0141) BP: 130/93 (06/16 0420) Pulse Rate: 79 (06/16 0420)  Labs:  Recent Labs  01/25/17 1928 01/26/17 0211 01/26/17 0521  HGB 13.7 13.5  --   HCT 41.0 40.2  --   PLT 279 292  --   HEPARINUNFRC  --   --  1.04*  CREATININE 1.18*  --   --   TROPONINI  --  0.06*  --     Estimated Creatinine Clearance: 49.4 mL/min (A) (by C-G formula based on SCr of 1.18 mg/dL (H)).  Assessment: 14 yof on heparin for bilateral PE with right heart strain and large clot burden. Heparin level supratherapeutic (1.04) on gtt at 1200 units/hr.No bleeding noted. CBC stable.   Goal of Therapy:  Heparin level 0.3-0.7 units/ml Monitor platelets by anticoagulation protocol: Yes   Plan:  Hold heparin x 1 hour Decrease heparin gtt to 1000 units/hr Check a 6 hr heparin level  Sherlon Handing, PharmD, BCPS Clinical pharmacist, pager [phone] 01/26/2017,6:10 AM

## 2017-01-26 NOTE — Progress Notes (Signed)
  Echocardiogram 2D Echocardiogram has been performed.  [name] 01/26/2017, 4:09 PM

## 2017-01-26 NOTE — Progress Notes (Signed)
ANTICOAGULATION CONSULT NOTE - Follow Up Consult  Pharmacy Consult for Heparin Indication: pulmonary embolus  No Known Allergies  Patient Measurements: Height: 5\' 3"  (160 cm) Weight: 162 lb 4.8 oz (73.6 kg) IBW/kg (Calculated) : 52.4 Heparin Dosing Weight:    Vital Signs: Temp: 97.9 F (36.6 C) (06/16 2041) Temp Source: Oral (06/16 2041) BP: 114/83 (06/16 2041) Pulse Rate: 87 (06/16 2041)  Labs:  Recent Labs  01/25/17 1928 01/26/17 0211 01/26/17 0521 01/26/17 0740 01/26/17 1250 01/26/17 2039  HGB 13.7 13.5  --   --   --   --   HCT 41.0 40.2  --   --   --   --   PLT 279 292  --   --   --   --   HEPARINUNFRC  --   --  1.04*  --  0.60 0.57  CREATININE 1.18*  --   --  1.12*  --   --   TROPONINI  --  0.06*  --  0.06* 0.04*  --     Estimated Creatinine Clearance: 52 mL/min (A) (by C-G formula based on SCr of 1.12 mg/dL (H)).   Assessment:  Anticoag: Heparin for bilateral PE with large clot burden + RHS - CBC WNL, no AC PTA but has been on in the past.  - HL 0.6>>HL 0.57   Goal of Therapy:  Heparin level 0.3-0.7 units/ml Monitor platelets by anticoagulation protocol: Yes   Plan:  Continue IV heparin 1000 units/hr Daily HL and CBC   [name] S. Alford Highland, PharmD, BCPS Clinical Staff Pharmacist Pager [phone]  [name] 01/26/2017,9:36 PM

## 2017-01-26 NOTE — H&P (Signed)
History and Physical    [name] BPZ:[id number] DOB: 1955/09/17 DOA: 01/25/2017  PCP: Rogers Blocker, MD  Cardiology: [facility]  Patient coming from: Dr. Zenia Resides office  Chief Complaint: Shortness of breath x two weeks  HPI: [name] is a 61 y.o. woman with a history of HTN, "borderline" diabetes, occipital neuralgia and chronic headaches, and prior PE (spontaneous, diagnosed in 2012, does not recall hypercoag work-up, treated with warfarin for three years) who presents to the ED for evaluation of two weeks of shortness of breath with exertion and pleuritic chest pain.  No light-headedness or dizziness.  No near syncope.  No nausea, vomiting.  She has chronic ankle edema.  She has not had any calf pain or tenderness.  No leg swelling.  She has had bilateral thigh pain.  No prolonged periods of immobility.  ED Course: CTA chest shows "Bilateral pulmonary emboli at the confluence of the bilateral lobar pulmonary arterial branches, extending to all of the lobar and segmental branches.  Large clot burden with critically increased RV/LV ratio of 1.6, suggestive of right heart strain."  She has been started on a heparin drip per protocol.  Hospitalist asked to admit.  Review of Systems: As per HPI otherwise 10 systems reviewed and negative.   Past Medical History:  Diagnosis Date  . Acute bronchitis   . Acute maxillary sinusitis   . Allergic rhinitis   . Dysmetabolic syndrome    History of  . Essential hypertension, malignant   . Extrinsic asthma, unspecified   . Fibroids   . H/O: GI bleed   . History of arteritis   . Ischemic colitis, enteritis, or enterocolitis ([place])    Hisotry of  . Obesity, unspecified   . Otitis media of both ears   . Sinusitis   . Type II or unspecified type diabetes mellitus without mention of complication, not stated as uncontrolled    oral meds-insulin resistance  . Undifferentiated connective tissue disease ([place])    History of    Past Surgical  History:  Procedure Laterality Date  . ABDOMINAL HYSTERECTOMY  2000  . BREAST EXCISIONAL BIOPSY Left 1978  . CARDIAC CATHETERIZATION  09/25/05  . COLONOSCOPY W/ BIOPSIES       reports that she has never smoked. She has never used smokeless tobacco. She reports that she does not drink alcohol or use drugs.  She is a widow.  She has one daughter.  No Known Allergies  Family History  Problem Relation Age of Onset  . Kidney disease Mother   Aneurysms, CHF, and MI prevalent on her mother's side of the family. HTN and DM prevalent on both sides of the family. No known history of clotting disorders, strokes, or unexplained sudden death.  Several female relatives have had miscarriages.   Prior to Admission medications   Medication Sig Start Date End Date Taking? Authorizing Provider  Biotin 1000 MCG tablet Take 1,000 mcg by mouth at bedtime.    Yes [provider]  cholecalciferol (VITAMIN D) 1000 UNITS tablet Take 1,000 Units by mouth daily.   Yes [provider]  diltiazem (CARDIZEM CD) 360 MG 24 hr capsule Take 360 mg by mouth daily. 11/27/14  Yes [provider]  fluticasone (FLONASE) 50 MCG/ACT nasal spray Place 2 sprays into both nostrils daily. Reported on 08/08/2015 08/08/15  Yes Shawnee Knapp, MD  fluticasone (FLOVENT HFA) 44 MCG/ACT inhaler Inhale 2 puffs into the lungs 2 (two) times daily. Patient taking differently: Inhale 2  puffs into the lungs 2 (two) times daily as needed (for breathing).  06/29/11 01/25/17 Yes Rogers Blocker, MD  gabapentin (NEURONTIN) 300 MG capsule Take 300 mg by mouth at bedtime.    Yes [provider]  losartan-hydrochlorothiazide (HYZAAR) 100-12.5 MG per tablet Take 1 tablet by mouth every evening. 11/17/14  Yes [provider]  metFORMIN (GLUCOPHAGE) 500 MG tablet Take 500 mg by mouth every evening.    Yes [provider]  montelukast (SINGULAIR) 10 MG tablet Take 10 mg by mouth daily. 11/10/14  Yes [provider]  XOPENEX HFA 45 MCG/ACT inhaler Inhale 1-2 puffs into the lungs every 6 (six) hours as needed for wheezing. Reported on 08/08/2015 12/06/14  Yes [provider]    Physical Exam: Vitals:   01/26/17 0015 01/26/17 0030 01/26/17 0141 01/26/17 0147  BP: (!) 141/97 (!) 131/119 (!) 144/95   Pulse: 91 99 92   Resp: (!) 23 (!) 25 15   Temp:   98 F (36.7 C)   TempSrc:   Oral   SpO2: 95% 98% 97%   Weight:    73.6 kg (162 lb 4.8 oz)  Height:   5\' 3"  (1.6 m)       Constitutional: NAD, calm, comfortable Vitals:   01/26/17 0015 01/26/17 0030 01/26/17 0141 01/26/17 0147  BP: (!) 141/97 (!) 131/119 (!) 144/95   Pulse: 91 99 92   Resp: (!) 23 (!) 25 15   Temp:   98 F (36.7 C)   TempSrc:   Oral   SpO2: 95% 98% 97%   Weight:    73.6 kg (162 lb 4.8 oz)  Height:   5\' 3"  (1.6 m)    Eyes: PERRL, lids and conjunctivae normal ENMT: Mucous membranes are moist. Posterior pharynx clear of any exudate or lesions. Normal dentition.  Neck: normal appearance, supple, no masses Respiratory: clear to auscultation bilaterally, no wheezing, no crackles. Normal respiratory effort at rest but becomes mildly tachypneic with minimal exertion (sitting up in bed). No accessory muscle use.  Cardiovascular: Normal rate, regular rhythm, no murmurs / rubs / gallops. No extremity edema. 2+ pedal pulses.  GI: abdomen is soft and compressible.  No distention.  No tenderness.  Bowel sounds are hypoactive. Musculoskeletal:  No joint deformity in upper and lower extremities. Good ROM, no contractures. Normal muscle tone.  Skin: no rashes, warm and dry Neurologic: No apparent focal weakness. Psychiatric: Normal judgment and insight. Alert and oriented x 3. Normal mood.     Labs on Admission: I have personally reviewed following labs and imaging studies  CBC:  Recent Labs Lab 01/25/17 1928  WBC 7.5  HGB 13.7  HCT 41.0  MCV 90.5  PLT 270   Basic Metabolic Panel:  Recent Labs Lab  01/25/17 1928  NA 141  K 3.4*  CL 107  CO2 23  GLUCOSE 101*  BUN 10  CREATININE 1.18*  CALCIUM 9.7   GFR: Estimated Creatinine Clearance: 49.4 mL/min (A) (by C-G formula based on SCr of 1.18 mg/dL (H)).  Radiological Exams on Admission: Dg Chest 2 View  Result Date: 01/25/2017 CLINICAL DATA:  Left chest pain and shortness of breath for 2 weeks. History of pulmonary embolus EXAM: CHEST  2 VIEW COMPARISON:  06/02/2015 chest radiograph FINDINGS: The heart size and mediastinal contours are within normal limits. Both lungs are clear. The visualized skeletal structures are unremarkable. IMPRESSION: No significant radiographic abnormality is identified. Electronically Signed   By: Cindra Eves.D.  On: 01/25/2017 20:02   Ct Angio Chest Pe W Or Wo Contrast  Result Date: 01/25/2017 CLINICAL DATA:  Shortness of breath. EXAM: CT ANGIOGRAPHY CHEST WITH CONTRAST TECHNIQUE: Multidetector CT imaging of the chest was performed using the standard protocol during bolus administration of intravenous contrast. Multiplanar CT image reconstructions and MIPs were obtained to evaluate the vascular anatomy. CONTRAST:  75 cc Isovue 370 intravenously. COMPARISON:  Chest radiograph 01/25/2017 FINDINGS: Cardiovascular: Satisfactory opacification of the pulmonary arteries to the segmental level. There are bilateral pulmonary emboli at the level of the bifurcation of the lobar pulmonary arterial branches, and extending to all of the lobar and segmental branches. There is a large clot burden. RV/LV ratio equals 1.6. Mediastinum/Nodes: No enlarged mediastinal, hilar, or axillary lymph nodes. Thyroid gland, trachea, and esophagus demonstrate no significant findings. Lungs/Pleura: Lungs are clear. No pleural effusion or pneumothorax. Upper Abdomen: No acute abnormality. Musculoskeletal: No chest wall abnormality. No acute or significant osseous findings. Review of the MIP images confirms the above findings. IMPRESSION:  Bilateral pulmonary emboli at the confluence of the bilateral lobar pulmonary arterial branches, extending to all of the lobar and segmental branches. Large clot burden with critically increased RV/LV ratio of 1.6, suggestive of right heart strain. These results were called by telephone at the time of interpretation on 01/25/2017 at 10:21 pm to Dr. Orlie Dakin , who verbally acknowledged these results. Electronically Signed   By: Fidela Salisbury M.D.   On: 01/25/2017 22:23    EKG: Independently reviewed. Sinus tachycardia.  Assessment/Plan Principal Problem:   Bilateral pulmonary embolism (HCC) Active Problems:   HTN (hypertension)   DM (diabetes mellitus) (HCC)   DOE (dyspnea on exertion)   Elevated troponin      Acute bilateral PE; submassive; high risk.  DOE noted in the ED with minimal exertion. --PCCM consult appreciated.  No lytics but she is high risk. --Anticoagulate with IV heparin for now --Patient concerned about being on NOACs; says that her PCP did not want her using these medications in the past.  She is requesting that someone speak with her PCP Dr. Marlou Sa before discharge re: anticoagulation options. --Bilateral thigh pain: will check venous doppler --complete echo --Serial troponin --Bed rest for now  HTN --Hold losartan HCTZ now in case she has relative hypotension; continue cardizem.  DM --Hold metformin.  Low dose sliding insulin   DVT prophylaxis: Anticoagulated with IV heparin Code Status: FULL Family Communication: Patient alone in the ED at time of admission. Disposition Plan: Expect she will go home when ready for discharge. Consults called: PCCM Admission status: Place in observation, stepdown unit.   TIME SPENT: 60 minutes   Eber Jones MD [facility] Pager [phone]  If 7PM-7AM, please contact night-coverage [email or web address] Password [email or web address]  01/26/2017, 1:48 AM

## 2017-01-26 NOTE — Progress Notes (Signed)
*  PRELIMINARY RESULTS* Vascular Ultrasound Lower extremity venous duplex has been completed.  Preliminary findings: No evidence of DVT or baker's cyst.    Landry Mellow, RDMS, RVT  01/26/2017, 10:37 AM

## 2017-01-27 LAB — BASIC METABOLIC PANEL
Anion gap: 7 (ref 5–15)
BUN: 14 mg/dL (ref 6–20)
CALCIUM: 9.1 mg/dL (ref 8.9–10.3)
CO2: 24 mmol/L (ref 22–32)
Chloride: 108 mmol/L (ref 101–111)
Creatinine, Ser: 1.24 mg/dL — ABNORMAL HIGH (ref 0.44–1.00)
GFR calc Af Amer: 54 mL/min — ABNORMAL LOW (ref >60)
GFR, EST NON AFRICAN AMERICAN: 47 mL/min — AB (ref >60)
GLUCOSE: 99 mg/dL (ref 65–99)
POTASSIUM: 4.1 mmol/L (ref 3.5–5.1)
Sodium: 139 mmol/L (ref 135–145)

## 2017-01-27 LAB — LIPID PANEL
CHOLESTEROL: 153 mg/dL (ref 0–200)
HDL: 60 mg/dL (ref >40)
LDL Cholesterol: 76 mg/dL (ref 0–99)
Total CHOL/HDL Ratio: 2.6 RATIO
Triglycerides: 86 mg/dL (ref <150)
VLDL: 17 mg/dL (ref 0–40)

## 2017-01-27 LAB — CBC
HCT: 40.1 % (ref 36.0–46.0)
Hemoglobin: 13 g/dL (ref 12.0–15.0)
MCH: 30 pg (ref 26.0–34.0)
MCHC: 32.4 g/dL (ref 30.0–36.0)
MCV: 92.4 fL (ref 78.0–100.0)
PLATELETS: 289 10*3/uL (ref 150–400)
RBC: 4.34 MIL/uL (ref 3.87–5.11)
RDW: 13.5 % (ref 11.5–15.5)
WBC: 6.7 10*3/uL (ref 4.0–10.5)

## 2017-01-27 LAB — GLUCOSE, CAPILLARY
GLUCOSE-CAPILLARY: 74 mg/dL (ref 65–99)
GLUCOSE-CAPILLARY: 126 mg/dL — AB (ref 65–99)
Glucose-Capillary: 87 mg/dL (ref 65–99)
Glucose-Capillary: 103 mg/dL — ABNORMAL HIGH (ref 65–99)

## 2017-01-27 LAB — HEPARIN LEVEL (UNFRACTIONATED): Heparin Unfractionated: 0.56 IU/mL (ref 0.30–0.70)

## 2017-01-27 LAB — BRAIN NATRIURETIC PEPTIDE: B Natriuretic Peptide: 156.2 pg/mL — ABNORMAL HIGH (ref 0.0–100.0)

## 2017-01-27 LAB — TROPONIN I: TROPONIN I: 0.03 ng/mL — AB (ref <0.03)

## 2017-01-27 MED ORDER — FENTANYL CITRATE (PF) 100 MCG/2ML IJ SOLN
25.0000 ug | INTRAMUSCULAR | Status: DC | PRN
  Administered 2017-01-27 – 2017-01-30 (×6): 50 ug via INTRAVENOUS
  Administered 2017-01-31: 25 ug via INTRAVENOUS
  Filled 2017-01-27 (×7): qty 2

## 2017-01-27 MED ORDER — DIPHENHYDRAMINE HCL 25 MG PO CAPS
25.0000 mg | ORAL_CAPSULE | Freq: Four times a day (QID) | ORAL | Status: DC | PRN
  Administered 2017-01-27 (×2): 25 mg via ORAL
  Filled 2017-01-27 (×2): qty 1

## 2017-01-27 NOTE — Progress Notes (Signed)
ANTICOAGULATION CONSULT NOTE - Follow Up Consult  Pharmacy Consult for Heparin Indication: pulmonary embolus  Allergies  Allergen Reactions  . Morphine And Related     Generalized itching    Patient Measurements: Height: 5\' 3"  (160 cm) Weight: 162 lb 4.8 oz (73.6 kg) IBW/kg (Calculated) : 52.4 Heparin Dosing Weight:  68 kg   Vital Signs: Temp: 97.8 F (36.6 C) (06/17 0019) Temp Source: Oral (06/17 0019) BP: 115/79 (06/17 0019) Pulse Rate: 81 (06/17 0019)  Labs:  Recent Labs  01/25/17 1928  01/26/17 0211  01/26/17 0740 01/26/17 1250 01/26/17 2039 01/27/17 0438  HGB 13.7  --  13.5  --   --   --   --  13.0  HCT 41.0  --  40.2  --   --   --   --  40.1  PLT 279  --  292  --   --   --   --  289  HEPARINUNFRC  --   --   --   < >  --  0.60 0.57 0.56  CREATININE 1.18*  --   --   --  1.12*  --   --  1.24*  TROPONINI  --   < > 0.06*  --  0.06* 0.04*  --  0.03*  < > = values in this interval not displayed.  Estimated Creatinine Clearance: 47 mL/min (A) (by C-G formula based on SCr of 1.24 mg/dL (H)).   Assessment: Heparin for bilateral PE with large clot burden + RHS. Pharmacy to dose heparin. Heparin level therapeutic at 0.56 on 1000 units/hr. CBC is stable and no overt s/s bleeding noted.   Goal of Therapy:  Heparin level 0.3-0.7 units/ml Monitor platelets by anticoagulation protocol: Yes   Plan:  Continue IV heparin 1000 units/hr Daily heparin level and CBC Monitor for s/s bleeding F/u long-term [facility] plan   Argie Ramming, PharmD Pharmacy Resident  Pager [phone] 01/27/17 8:44 AM

## 2017-01-27 NOTE — Progress Notes (Signed)
PROGRESS NOTE    [name]  NWG:[id number] DOB: 01-08-1956 DOA: 01/25/2017 PCP: Rogers Blocker, MD   Brief Narrative:  60 y.o. BF PMHx Essential HTN, DM type II controlled without complication, Occipital neuralgia and chronic headaches, and prior PE (spontaneous, diagnosed in 2012, does not recall hypercoag work-up, treated with warfarin for three years),Undifferentiated connective tissue disease, Dysmetabolic syndrome  Who presents to the ED for evaluation of two weeks of shortness of breath with exertion and pleuritic chest pain.  No light-headedness or dizziness.  No near syncope.  No nausea, vomiting.  She has chronic ankle edema.  She has not had any calf pain or tenderness.  No leg swelling.  She has had bilateral thigh pain.  No prolonged periods of immobility.  ED Course: CTA chest shows "Bilateral pulmonary emboli at the confluence of the bilateral lobar pulmonary arterial branches, extending to all of the lobar and segmental branches.  Large clot burden with critically increased RV/LV ratio of 1.6, suggestive of right heart strain."  She has been started on a heparin drip per protocol.  Hospitalist asked to admit.   Subjective: 6/17   A/O 4, positive SOB, positive CP, negative lower extremity pain. Positive general puritus    Assessment & Plan:   Principal Problem:   Bilateral pulmonary embolism (HCC) Active Problems:   HTN (hypertension)   DM (diabetes mellitus) (HCC)   DOE (dyspnea on exertion)   Elevated troponin   Acute bilateral PE; submassive;  -high risk.  DOE noted in the ED with minimal exertion. --PCCM consult appreciated.  No lytics but she is high risk. --Anticoagulate with IV heparin for now --Patient concerned about being on NOACs; says that her PCP did not want her using these medications in the past.  She is requesting that someone speak with her PCP Dr. Marlou Sa before discharge re: anticoagulation options. --Start Coumadin per pharmacy on  6/18  Essential HTN --Hold losartan HCTZ now in case she has relative hypotension; -Cardizem 360 mg daily  DM type II --Hold metformin.  Low dose sliding insulin -Hemoglobin A1c pending -Lipid Panel pending   DVT prophylaxis: Heparin drip--> Coumadin Code Status: Full Family Communication: None Disposition Plan: Expect discharge home in 72-96 hours after Coumadin started   Consultants:  None    Procedures/Significant Events:  6/15 CT image echocardiogram PE protocol; -Bilateral pulmonary emboli at the confluence of the bilateral lobar pulmonary arterial branches, extending to all of the lobar and segmental branches. -Large clot burden with critically increased RV/LV ratio of 1.6, suggestive of right heart strain. 6/16 Echocardiogram:Left ventricle: mild LVH. -LVEF = 65%-to 70%.  -(grade 1 diastolic dysfunction). - Pulmonary arteries:  PA peak pressure: 51 mm Hg (S). 6/16 bilateral lower extremity Doppler: Negative DVT involving the right lower extremity and left lower extremity.   VENTILATOR SETTINGS: None   Cultures None  Antimicrobials: Anti-infectives    None       Devices None   LINES / TUBES:  None    Continuous Infusions: . heparin 1,000 Units/hr (01/26/17 2050)     Objective: Vitals:   01/26/17 1631 01/26/17 2041 01/26/17 2206 01/27/17 0019  BP: 136/89 114/83  115/79  Pulse:  87 79 81  Resp:  20 16 14   Temp: 98.1 F (36.7 C) 97.9 F (36.6 C)  97.8 F (36.6 C)  TempSrc: Oral Oral  Oral  SpO2: 98% 100% 99% 98%  Weight:      Height:        Intake/Output Summary (  Last 24 hours) at 01/27/17 0731 Last data filed at 01/27/17 0600  Gross per 24 hour  Intake           221.67 ml  Output              500 ml  Net          -278.33 ml   Filed Weights   01/25/17 1920 01/26/17 0147  Weight: 165 lb (74.8 kg) 162 lb 4.8 oz (73.6 kg)    Examination:  General: A/O 4, positive acute respiratory distress Eyes: negative scleral hemorrhage,  negative anisocoria, negative icterus ENT: Negative Runny nose, negative gingival bleeding, Neck:  Negative scars, masses, torticollis, lymphadenopathy, JVD Lungs: Clear to auscultation bilaterally without wheezes or crackles Cardiovascular: Regular rate and rhythm without murmur gallop or rub normal S1 and S2 Abdomen: negative abdominal pain, nondistended, positive soft, bowel sounds, no rebound, no ascites, no appreciable mass Extremities: No significant cyanosis, clubbing, or edema bilateral lower extremities Skin: Negative rashes, lesions, ulcers Psychiatric:  Negative depression, negative anxiety, negative fatigue, negative mania  Central nervous system:  Cranial nerves II through XII intact, tongue/uvula midline, all extremities muscle strength 5/5, sensation intact throughout, negative dysarthria, negative expressive aphasia, negative receptive aphasia.  .     Data Reviewed: Care during the described time interval was provided by me .  I have reviewed this patient's available data, including medical history, events of note, physical examination, and all test results as part of my evaluation. I have personally reviewed and interpreted all radiology studies.  CBC:  Recent Labs Lab 01/25/17 1928 01/26/17 0211 01/27/17 0438  WBC 7.5 7.6 6.7  HGB 13.7 13.5 13.0  HCT 41.0 40.2 40.1  MCV 90.5 90.7 92.4  PLT 279 292 263   Basic Metabolic Panel:  Recent Labs Lab 01/25/17 1928 01/26/17 0740 01/27/17 0438  NA 141 139 139  K 3.4* 4.8 4.1  CL 107 108 108  CO2 23 21* 24  GLUCOSE 101* 98 99  BUN 10 10 14   CREATININE 1.18* 1.12* 1.24*  CALCIUM 9.7 9.3 9.1   GFR: Estimated Creatinine Clearance: 47 mL/min (A) (by C-G formula based on SCr of 1.24 mg/dL (H)). Liver Function Tests: No results for input(s): AST, ALT, ALKPHOS, BILITOT, PROT, ALBUMIN in the last 168 hours. No results for input(s): LIPASE, AMYLASE in the last 168 hours. No results for input(s): AMMONIA in the last  168 hours. Coagulation Profile: No results for input(s): INR, PROTIME in the last 168 hours. Cardiac Enzymes:  Recent Labs Lab 01/26/17 0211 01/26/17 0740 01/26/17 1250 01/27/17 0438  TROPONINI 0.06* 0.06* 0.04* 0.03*   BNP (last 3 results) No results for input(s): PROBNP in the last 8760 hours. HbA1C: No results for input(s): HGBA1C in the last 72 hours. CBG:  Recent Labs Lab 01/26/17 0721 01/26/17 1119 01/26/17 1636 01/26/17 2041  GLUCAP 101* 108* 137* 125*   Lipid Profile: No results for input(s): CHOL, HDL, LDLCALC, TRIG, CHOLHDL, LDLDIRECT in the last 72 hours. Thyroid Function Tests: No results for input(s): TSH, T4TOTAL, FREET4, T3FREE, THYROIDAB in the last 72 hours. Anemia Panel: No results for input(s): VITAMINB12, FOLATE, FERRITIN, TIBC, IRON, RETICCTPCT in the last 72 hours. Urine analysis: No results found for: COLORURINE, APPEARANCEUR, LABSPEC, PHURINE, GLUCOSEU, HGBUR, BILIRUBINUR, KETONESUR, PROTEINUR, UROBILINOGEN, NITRITE, LEUKOCYTESUR Sepsis Labs: @LABRCNTIP (procalcitonin:4,lacticidven:4)  ) Recent Results (from the past 240 hour(s))  MRSA PCR Screening     Status: None   Collection Time: 01/26/17  1:51 AM  Result Value Ref  Range Status   MRSA by PCR NEGATIVE NEGATIVE Final    Comment:        The GeneXpert MRSA Assay (FDA approved for NASAL specimens only), is one component of a comprehensive MRSA colonization surveillance program. It is not intended to diagnose MRSA infection nor to guide or monitor treatment for MRSA infections.          Radiology Studies: Dg Chest 2 View  Result Date: 01/25/2017 CLINICAL DATA:  Left chest pain and shortness of breath for 2 weeks. History of pulmonary embolus EXAM: CHEST  2 VIEW COMPARISON:  06/02/2015 chest radiograph FINDINGS: The heart size and mediastinal contours are within normal limits. Both lungs are clear. The visualized skeletal structures are unremarkable. IMPRESSION: No significant  radiographic abnormality is identified. Electronically Signed   By: Van Clines M.D.   On: 01/25/2017 20:02   Ct Angio Chest Pe W Or Wo Contrast  Result Date: 01/25/2017 CLINICAL DATA:  Shortness of breath. EXAM: CT ANGIOGRAPHY CHEST WITH CONTRAST TECHNIQUE: Multidetector CT imaging of the chest was performed using the standard protocol during bolus administration of intravenous contrast. Multiplanar CT image reconstructions and MIPs were obtained to evaluate the vascular anatomy. CONTRAST:  75 cc Isovue 370 intravenously. COMPARISON:  Chest radiograph 01/25/2017 FINDINGS: Cardiovascular: Satisfactory opacification of the pulmonary arteries to the segmental level. There are bilateral pulmonary emboli at the level of the bifurcation of the lobar pulmonary arterial branches, and extending to all of the lobar and segmental branches. There is a large clot burden. RV/LV ratio equals 1.6. Mediastinum/Nodes: No enlarged mediastinal, hilar, or axillary lymph nodes. Thyroid gland, trachea, and esophagus demonstrate no significant findings. Lungs/Pleura: Lungs are clear. No pleural effusion or pneumothorax. Upper Abdomen: No acute abnormality. Musculoskeletal: No chest wall abnormality. No acute or significant osseous findings. Review of the MIP images confirms the above findings. IMPRESSION: Bilateral pulmonary emboli at the confluence of the bilateral lobar pulmonary arterial branches, extending to all of the lobar and segmental branches. Large clot burden with critically increased RV/LV ratio of 1.6, suggestive of right heart strain. These results were called by telephone at the time of interpretation on 01/25/2017 at 10:21 pm to Dr. Orlie Dakin , who verbally acknowledged these results. Electronically Signed   By: Fidela Salisbury M.D.   On: 01/25/2017 22:23        Scheduled Meds: . diltiazem  360 mg Oral Daily  . gabapentin  300 mg Oral QHS  . insulin aspart  0-9 Units Subcutaneous TID WC  .  montelukast  10 mg Oral QHS  . sodium chloride flush  3 mL Intravenous Q12H   Continuous Infusions: . heparin 1,000 Units/hr (01/26/17 2050)     LOS: 1 day    Time spent: 40 minutes    Miyuki Rzasa, Geraldo Docker, MD [facility] Pager [phone]   If 7PM-7AM, please contact night-coverage [email or web address] Password [facility] 01/27/2017, 7:31 AM

## 2017-01-28 DIAGNOSIS — I2699 Other pulmonary embolism without acute cor pulmonale: Principal | ICD-10-CM

## 2017-01-28 LAB — CBC
HCT: 43.2 % (ref 36.0–46.0)
Hemoglobin: 14.3 g/dL (ref 12.0–15.0)
MCH: 30.6 pg (ref 26.0–34.0)
MCHC: 33.1 g/dL (ref 30.0–36.0)
MCV: 92.3 fL (ref 78.0–100.0)
PLATELETS: 258 10*3/uL (ref 150–400)
RBC: 4.68 MIL/uL (ref 3.87–5.11)
RDW: 13.4 % (ref 11.5–15.5)
WBC: 6.7 10*3/uL (ref 4.0–10.5)

## 2017-01-28 LAB — PROTIME-INR
INR: 1.04
PROTHROMBIN TIME: 13.6 s (ref 11.4–15.2)

## 2017-01-28 LAB — GLUCOSE, CAPILLARY
Glucose-Capillary: 86 mg/dL (ref 65–99)
Glucose-Capillary: 100 mg/dL — ABNORMAL HIGH (ref 65–99)
Glucose-Capillary: 135 mg/dL — ABNORMAL HIGH (ref 65–99)
Glucose-Capillary: 83 mg/dL (ref 65–99)

## 2017-01-28 LAB — HEMOGLOBIN A1C
HEMOGLOBIN A1C: 6.1 % — AB (ref 4.8–5.6)
Mean Plasma Glucose: 128 mg/dL

## 2017-01-28 LAB — HEPARIN LEVEL (UNFRACTIONATED)
Heparin Unfractionated: 0.74 IU/mL — ABNORMAL HIGH (ref 0.30–0.70)
Heparin Unfractionated: 0.53 IU/mL (ref 0.30–0.70)

## 2017-01-28 MED ORDER — WARFARIN VIDEO: Freq: Once | Status: DC

## 2017-01-28 MED ORDER — SODIUM CHLORIDE 0.9 % IV SOLN
INTRAVENOUS | Status: DC
  Administered 2017-01-28 – 2017-01-31 (×5): via INTRAVENOUS

## 2017-01-28 MED ORDER — WARFARIN SODIUM 5 MG PO TABS
5.0000 mg | ORAL_TABLET | Freq: Once | ORAL | Status: AC
  Administered 2017-01-28: 5 mg via ORAL
  Filled 2017-01-28: qty 1

## 2017-01-28 MED ORDER — PATIENT'S GUIDE TO USING COUMADIN BOOK
Freq: Once | Status: AC
  Administered 2017-01-28: 16:00:00
  Filled 2017-01-28: qty 1

## 2017-01-28 MED ORDER — WARFARIN - PHARMACIST DOSING INPATIENT
Freq: Every day | Status: DC
  Administered 2017-01-28 – 2017-01-30 (×3)

## 2017-01-28 MED ORDER — ORAL CARE MOUTH RINSE
15.0000 mL | Freq: Two times a day (BID) | OROMUCOSAL | Status: DC
  Administered 2017-01-28 – 2017-02-01 (×7): 15 mL via OROMUCOSAL

## 2017-01-28 NOTE — Progress Notes (Signed)
Name: [name] MRN: [id number] DOB: 11/06/1955    ADMISSION DATE:  01/25/2017 CONSULTATION DATE: 01/26/17  REFERRING MD : Dr Eulas Post  CHIEF COMPLAINT:  PE   HISTORY OF PRESENT ILLNESS:  53yoF with history of HTN, DM, Undifferentiated connective tissue disease, Asthma, PE (in 2012) treated with anticoag for 3 years, GIB (per patient was 7-49yrs ago, not on anticoag at the time, source was the colon and stopped without intervention or blood transfusion; did require admission), now admitted with submassive PE. Patient presented to the ER today c/o CP and SOB x 2 weeks. She reports the CP is left-sided, constant but pleuritic in nature. The SOB is constant but worse with exertion. She also c/o BLE pain from knees to thighs. She said she finally came in today because her PCP told her she had to. She denies any immobility, bedrest, recent surgeries, long car/plane trips. She says she doesn't think she had any hypercoagulable workup after her first PE.   PAST MEDICAL HISTORY :   has a past medical history of Acute bronchitis; Acute maxillary sinusitis; Allergic rhinitis; Dysmetabolic syndrome; Essential hypertension, malignant; Extrinsic asthma, unspecified; Fibroids; H/O: GI bleed; History of arteritis; Ischemic colitis, enteritis, or enterocolitis ([place]); Obesity, unspecified; Otitis media of both ears; Sinusitis; Type II or unspecified type diabetes mellitus without mention of complication, not stated as uncontrolled; and Undifferentiated connective tissue disease ([place]).  has a past surgical history that includes Colonoscopy w/ biopsies; Abdominal hysterectomy (2000); Cardiac catheterization (09/25/05); and Breast excisional biopsy (Left, 1978). Prior to Admission medications   Medication Sig Start Date End Date Taking? Authorizing Provider  Biotin 1000 MCG tablet Take 1,000 mcg by mouth at bedtime.    Yes [provider]  cholecalciferol (VITAMIN D) 1000 UNITS tablet Take 1,000 Units by  mouth daily.   Yes [provider]  diltiazem (CARDIZEM CD) 360 MG 24 hr capsule Take 360 mg by mouth daily. 11/27/14  Yes [provider]  fluticasone (FLONASE) 50 MCG/ACT nasal spray Place 2 sprays into both nostrils daily. Reported on 08/08/2015 08/08/15  Yes Shawnee Knapp, MD  fluticasone (FLOVENT HFA) 44 MCG/ACT inhaler Inhale 2 puffs into the lungs 2 (two) times daily. Patient taking differently: Inhale 2 puffs into the lungs 2 (two) times daily as needed (for breathing).  06/29/11 01/25/17 Yes Rogers Blocker, MD  gabapentin (NEURONTIN) 300 MG capsule Take 300 mg by mouth at bedtime.    Yes [provider]  losartan-hydrochlorothiazide (HYZAAR) 100-12.5 MG per tablet Take 1 tablet by mouth every evening. 11/17/14  Yes [provider]  metFORMIN (GLUCOPHAGE) 500 MG tablet Take 500 mg by mouth every evening.    Yes [provider]  montelukast (SINGULAIR) 10 MG tablet Take 10 mg by mouth daily. 11/10/14  Yes [provider]  XOPENEX HFA 45 MCG/ACT inhaler Inhale 1-2 puffs into the lungs every 6 (six) hours as needed for wheezing. Reported on 08/08/2015 12/06/14  Yes [provider]  Azelastine HCl 0.15 % SOLN Place 2 sprays into both nostrils daily. For allergies Patient not taking: Reported on 01/02/2016 08/08/15   Shawnee Knapp, MD  oxymetazoline [facility] NASAL SPRAY) 0.05 % nasal spray Place 1 spray into both nostrils 2 (two) times daily. FOR NO LONGER THAN 3 DAYS AT A STRETCH Patient not taking: Reported on 01/25/2017 08/08/15   Shawnee Knapp, MD   Allergies  Allergen Reactions  . Morphine And Related     Generalized itching    FAMILY  HISTORY:  family history includes Kidney disease in her mother. SOCIAL HISTORY:  reports that she has never smoked. She has never used smokeless tobacco. She reports that she does not drink alcohol or use drugs.  REVIEW OF SYSTEMS:   Constitutional: Negative for fever, chills, weight loss, malaise/fatigue  and diaphoresis.  HENT: Negative for hearing loss, ear pain, nosebleeds, congestion, sore throat, neck pain, tinnitus and ear discharge.   Eyes: Negative for blurred vision, double vision, photophobia, pain, discharge and redness.  Respiratory: Positive for  shortness of breath. Negative for cough, hemoptysis, sputum production,wheezing and stridor.   Cardiovascular: Positive for chest pain. Negative for palpitations, orthopnea, claudication, leg swelling and PND.  Gastrointestinal: Negative for heartburn, nausea, vomiting, abdominal pain, diarrhea, constipation, blood in stool and melena.  Genitourinary: Negative for dysuria, urgency, frequency, hematuria and flank pain.  Musculoskeletal: Positive for Bilateral Leg pain. Negative for myalgias, back pain, joint pain and falls.  Skin: Negative for itching and rash.  Neurological: Negative for dizziness, tingling, tremors, sensory change, speech change, focal weakness, seizures, loss of consciousness, weakness and headaches.  Endo/Heme/Allergies: Negative for environmental allergies and polydipsia. Does not bruise/bleed easily.  VITAL SIGNS: Temp:  [98.1 F (36.7 C)-98.9 F (37.2 C)] 98.4 F (36.9 C) (06/18 0802) Pulse Rate:  [69-81] 72 (06/18 0802) Resp:  [11-18] 18 (06/18 0802) BP: (110-132)/(69-86) 132/86 (06/18 0359) SpO2:  [98 %-100 %] 98 % (06/18 0802) Weight:  [166 lb 9.6 oz (75.6 kg)] 166 lb 9.6 oz (75.6 kg) (06/18 0359)  PHYSICAL EXAMINATION: Gen:      No acute distress HEENT:  EOMI, sclera anicteric Neck:     No masses; no thyromegaly Lungs:    Clear to auscultation bilaterally; normal respiratory effort CV:         Regular rate and rhythm; no murmurs Abd:      + bowel sounds; soft, non-tender; no palpable masses, no distension Ext:    No edema; adequate peripheral perfusion Skin:      Warm and dry; no rash Neuro: alert and oriented x 3 Psych: normal mood and affect   Recent Labs Lab 01/25/17 1928 01/26/17 0740  01/27/17 0438  NA 141 139 139  K 3.4* 4.8 4.1  CL 107 108 108  CO2 23 21* 24  BUN 10 10 14   CREATININE 1.18* 1.12* 1.24*  GLUCOSE 101* 98 99    Recent Labs Lab 01/26/17 0211 01/27/17 0438 01/28/17 0552  HGB 13.5 13.0 14.3  HCT 40.2 40.1 43.2  WBC 7.6 6.7 6.7  PLT 292 289 258   No results found.  ASSESSMENT / PLAN: 10yoF with history of HTN, DM, Undifferentiated connective tissue disease, Asthma, PE (in 2012) treated with anticoag for 3 years, and remote GIB, now admitted with submassive PE.  I have reviewed the CT which shows bilateral PE, mild RV strain. She continues on heparin anticoagulation with plans to transition to Coumadin. Respiratory status is stable and O2 is weaning down. BP is stable Echo shows normal RV function with moderate pulmonary HTN.  - No plans for lytics - Continue anticoagulation. Transition to oral Coumadin planned - She will need repeat echo as out patient after several months of anticoagulation - Recommend indefinite anticoagulation as this is her second episode of PE.  PCCM will sign off. Please call with any questions.  Marshell Garfinkel MD [place] [facility] Pager [phone] If no answer or after 3pm call: [phone] 01/28/2017, 11:26 AM

## 2017-01-28 NOTE — Progress Notes (Signed)
ANTICOAGULATION CONSULT NOTE - Follow Up Consult  Pharmacy Consult for HEPARIN Indication: pulmonary embolus  Labs:  Recent Labs  01/25/17 1928  01/26/17 0211  01/26/17 0740 01/26/17 1250 01/26/17 2039 01/27/17 0438 01/28/17 0552  HGB 13.7  --  13.5  --   --   --   --  13.0 14.3  HCT 41.0  --  40.2  --   --   --   --  40.1 43.2  PLT 279  --  292  --   --   --   --  289 258  LABPROT  --   --   --   --   --   --   --   --  13.6  INR  --   --   --   --   --   --   --   --  1.04  HEPARINUNFRC  --   --   --   < >  --  0.60 0.57 0.56 0.74*  CREATININE 1.18*  --   --   --  1.12*  --   --  1.24*  --   TROPONINI  --   < > 0.06*  --  0.06* 0.04*  --  0.03*  --   < > = values in this interval not displayed.   Assessment: 61yo female now above goal on heparin after three levels at goal; RN reports no issues with gtt or any signs of bleeding.  Goal of Therapy:  Heparin level 0.3-0.7 units/ml   Plan:  Will decrease heparin gtt slightly to 900 units/hr and check level in [place], PharmD, BCPS  01/28/2017,6:41 AM

## 2017-01-28 NOTE — Progress Notes (Signed)
ANTICOAGULATION CONSULT NOTE - Follow Up Consult  Pharmacy Consult for Heparin and warfarin  Indication: pulmonary embolus  Allergies  Allergen Reactions  . Morphine And Related     Generalized itching    Patient Measurements: Height: 5\' 3"  (160 cm) Weight: 166 lb 9.6 oz (75.6 kg) IBW/kg (Calculated) : 52.4 Heparin Dosing Weight:  68 kg   Vital Signs: Temp: 98.2 F (36.8 C) (06/18 1300) Temp Source: Oral (06/18 1300) BP: 128/88 (06/18 1300) Pulse Rate: 72 (06/18 1300)  Labs:  Recent Labs  01/25/17 1928  01/26/17 0211  01/26/17 0740 01/26/17 1250  01/27/17 0438 01/28/17 0552 01/28/17 1540  HGB 13.7  --  13.5  --   --   --   --  13.0 14.3  --   HCT 41.0  --  40.2  --   --   --   --  40.1 43.2  --   PLT 279  --  292  --   --   --   --  289 258  --   LABPROT  --   --   --   --   --   --   --   --  13.6  --   INR  --   --   --   --   --   --   --   --  1.04  --   HEPARINUNFRC  --   --   --   < >  --  0.60  < > 0.56 0.74* 0.53  CREATININE 1.18*  --   --   --  1.12*  --   --  1.24*  --   --   TROPONINI  --   < > 0.06*  --  0.06* 0.04*  --  0.03*  --   --   < > = values in this interval not displayed.  Estimated Creatinine Clearance: 47.6 mL/min (A) (by C-G formula based on SCr of 1.24 mg/dL (H)).   Assessment: Heparin for bilateral PE with large clot burden + RHS. Currently on heparin drip 900 uts/hr HL 0.53 at goal and to start warfarin this evening. CBC stable.   Goal of Therapy:  INR 2-3 Heparin level 0.3-0.7 units/ml Monitor platelets by anticoagulation protocol: Yes   Plan:  1. Continue IV heparin 900 units/hr 2. Warfarin 5 mg x 1 this evening  3. Daily heparin level, INR and CBC every 72 hours      Bonnita Nasuti Pharm.D. CPP, BCPS Clinical Pharmacist [phone] 01/28/2017 4:23 PM

## 2017-01-28 NOTE — Progress Notes (Signed)
ANTICOAGULATION CONSULT NOTE - Follow Up Consult  Pharmacy Consult for Heparin and warfarin  Indication: pulmonary embolus  Allergies  Allergen Reactions  . Morphine And Related     Generalized itching    Patient Measurements: Height: 5\' 3"  (160 cm) Weight: 166 lb 9.6 oz (75.6 kg) IBW/kg (Calculated) : 52.4 Heparin Dosing Weight:  68 kg   Vital Signs: Temp: 98.4 F (36.9 C) (06/18 0802) Temp Source: Oral (06/18 0802) BP: 132/86 (06/18 0359) Pulse Rate: 72 (06/18 0802)  Labs:  Recent Labs  01/25/17 1928  01/26/17 0211  01/26/17 0740 01/26/17 1250 01/26/17 2039 01/27/17 0438 01/28/17 0552  HGB 13.7  --  13.5  --   --   --   --  13.0 14.3  HCT 41.0  --  40.2  --   --   --   --  40.1 43.2  PLT 279  --  292  --   --   --   --  289 258  LABPROT  --   --   --   --   --   --   --   --  13.6  INR  --   --   --   --   --   --   --   --  1.04  HEPARINUNFRC  --   --   --   < >  --  0.60 0.57 0.56 0.74*  CREATININE 1.18*  --   --   --  1.12*  --   --  1.24*  --   TROPONINI  --   < > 0.06*  --  0.06* 0.04*  --  0.03*  --   < > = values in this interval not displayed.  Estimated Creatinine Clearance: 47.6 mL/min (A) (by C-G formula based on SCr of 1.24 mg/dL (H)).   Assessment: Heparin for bilateral PE with large clot burden + RHS. Currently on heparin and to start warfarin this evening. CBC stable.   Goal of Therapy:  INR 2-3 Heparin level 0.3-0.7 units/ml Monitor platelets by anticoagulation protocol: Yes   Plan:  1. Continue IV heparin 900 units/hr 2. Warfarin 5 mg x 1 this evening  3. Daily heparin level, INR and CBC every 72 hours    Vincenza Hews, PharmD, BCPS 01/28/2017, 10:24 AM

## 2017-01-28 NOTE — Progress Notes (Signed)
[place] TEAM 1 - Stepdown/ICU TEAM  [name]  ZOX:[id number] DOB: 26-May-1956 DOA: 01/25/2017 PCP: Rogers Blocker, MD    Brief Narrative:  61 y.o.F Hx HTN, DM II, Occipital neuralgia w/ chronic headaches, prior PE (spontaneous, diagnosed in 2012, does not recall hypercoag work-up, treated with warfarin for three years), and undifferentiated connective tissue disease who presented to the ED w/ two weeks of shortness of breath with exertion and pleuritic chest pain.  CTA noted "Bilateral pulmonary emboli at the confluence of the bilateral lobar pulmonary arterial branches, extending to all of the lobar and segmental branches. Large clot burden with critically increased RV/LV ratio of 1.6, suggestive of right heart strain."   Subjective: The pt reports ongoing pleuritic chest pain, w/ associated SOB.  She denies n/v, abdom pain, f/c, or HA.    Assessment & Plan:  Acute bilateral PE IV heparin for now - Patient concerned about being on NOACs; says that her PCP did not want her using these medications in the past. She is requesting that someone speak with her PCP Dr. Marlou Sa before discharge re: anticoagulation options - Coumadin per pharmacy to start today - pt understands that overlap w/ heparin > warfarin will be required   Essential HTN Reasonably well controlled at this time   DM II Well controlled at present   Very mild acute kidney injury Hydrate today and follow up in AM   DVT prophylaxis: heparin > coumadin  Code Status: FULL CODE Family Communication: no family present at time of exam  Disposition Plan: SDU   Consultants:  PCCM  Procedures: 6/16 TTE EF 65-70% - grade 1 diastolic dysfunction - PA peak pressure: 51 mm Hg (S). 6/16 bilateral lower extremity Doppler Negative DVT    Antimicrobials:  none  Objective: Blood pressure 132/86, pulse 72, temperature 98.4 F (36.9 C), temperature source Oral, resp. rate 18, height 5\' 3"  (1.6 m), weight 75.6 kg (166 lb 9.6  oz), SpO2 98 %.  Intake/Output Summary (Last 24 hours) at 01/28/17 0843 Last data filed at 01/28/17 0442  Gross per 24 hour  Intake              607 ml  Output                0 ml  Net              607 ml   Filed Weights   01/25/17 1920 01/26/17 0147 01/28/17 0359  Weight: 74.8 kg (165 lb) 73.6 kg (162 lb 4.8 oz) 75.6 kg (166 lb 9.6 oz)    Examination: General: No acute respiratory distress Lungs: Clear to auscultation bilaterally without wheezes or crackles Cardiovascular: Regular rate and rhythm without murmur gallop or rub normal S1 and S2 Abdomen: Nontender, nondistended, soft, bowel sounds positive, no rebound, no ascites, no appreciable mass Extremities: No significant cyanosis, clubbing, or edema bilateral lower extremities  CBC:  Recent Labs Lab 01/25/17 1928 01/26/17 0211 01/27/17 0438 01/28/17 0552  WBC 7.5 7.6 6.7 6.7  HGB 13.7 13.5 13.0 14.3  HCT 41.0 40.2 40.1 43.2  MCV 90.5 90.7 92.4 92.3  PLT 279 292 289 811   Basic Metabolic Panel:  Recent Labs Lab 01/25/17 1928 01/26/17 0740 01/27/17 0438  NA 141 139 139  K 3.4* 4.8 4.1  CL 107 108 108  CO2 23 21* 24  GLUCOSE 101* 98 99  BUN 10 10 14   CREATININE 1.18* 1.12* 1.24*  CALCIUM 9.7 9.3 9.1   GFR:  Estimated Creatinine Clearance: 47.6 mL/min (A) (by C-G formula based on SCr of 1.24 mg/dL (H)).  Liver Function Tests: No results for input(s): AST, ALT, ALKPHOS, BILITOT, PROT, ALBUMIN in the last 168 hours. No results for input(s): LIPASE, AMYLASE in the last 168 hours. No results for input(s): AMMONIA in the last 168 hours.  Coagulation Profile:  Recent Labs Lab 01/28/17 0552  INR 1.04    Cardiac Enzymes:  Recent Labs Lab 01/26/17 0211 01/26/17 0740 01/26/17 1250 01/27/17 0438  TROPONINI 0.06* 0.06* 0.04* 0.03*    HbA1C: Hgb A1c MFr Bld  Date/Time Value Ref Range Status  05/31/2012 6.5 (A) 4.0 - 6.0 % Final    CBG:  Recent Labs Lab 01/27/17 0803 01/27/17 1138  01/27/17 1620 01/27/17 2125 01/28/17 0735  GLUCAP 87 74 103* 126* 86    Recent Results (from the past 240 hour(s))  MRSA PCR Screening     Status: None   Collection Time: 01/26/17  1:51 AM  Result Value Ref Range Status   MRSA by PCR NEGATIVE NEGATIVE Final    Comment:        The GeneXpert MRSA Assay (FDA approved for NASAL specimens only), is one component of a comprehensive MRSA colonization surveillance program. It is not intended to diagnose MRSA infection nor to guide or monitor treatment for MRSA infections.      Scheduled Meds: . diltiazem  360 mg Oral Daily  . gabapentin  300 mg Oral QHS  . insulin aspart  0-9 Units Subcutaneous TID WC  . mouth rinse  15 mL Mouth Rinse BID  . montelukast  10 mg Oral QHS  . sodium chloride flush  3 mL Intravenous Q12H   Continuous Infusions: . heparin 900 Units/hr (01/28/17 0658)     LOS: 2 days   Cherene Altes, MD [facility] Office  [phone] Pager - Text Page per Amion as per below:  On-Call/Text Page:      [email or web address]      password [email or web address]  If 7PM-7AM, please contact night-coverage [email or web address] Password [facility] 01/28/2017, 8:43 AM

## 2017-01-29 LAB — GLUCOSE, CAPILLARY
GLUCOSE-CAPILLARY: 91 mg/dL (ref 65–99)
GLUCOSE-CAPILLARY: 81 mg/dL (ref 65–99)
Glucose-Capillary: 96 mg/dL (ref 65–99)
Glucose-Capillary: 103 mg/dL — ABNORMAL HIGH (ref 65–99)

## 2017-01-29 LAB — BASIC METABOLIC PANEL
ANION GAP: 6 (ref 5–15)
BUN: 9 mg/dL (ref 6–20)
CALCIUM: 9.1 mg/dL (ref 8.9–10.3)
CO2: 26 mmol/L (ref 22–32)
Chloride: 108 mmol/L (ref 101–111)
Creatinine, Ser: 1.04 mg/dL — ABNORMAL HIGH (ref 0.44–1.00)
GFR, EST NON AFRICAN AMERICAN: 58 mL/min — AB (ref >60)
Glucose, Bld: 91 mg/dL (ref 65–99)
Potassium: 4.1 mmol/L (ref 3.5–5.1)
Sodium: 140 mmol/L (ref 135–145)

## 2017-01-29 LAB — CBC
HCT: 41.4 % (ref 36.0–46.0)
HEMOGLOBIN: 13.6 g/dL (ref 12.0–15.0)
MCH: 30.3 pg (ref 26.0–34.0)
MCHC: 32.9 g/dL (ref 30.0–36.0)
MCV: 92.2 fL (ref 78.0–100.0)
Platelets: 279 10*3/uL (ref 150–400)
RBC: 4.49 MIL/uL (ref 3.87–5.11)
RDW: 13.4 % (ref 11.5–15.5)
WBC: 6.3 10*3/uL (ref 4.0–10.5)

## 2017-01-29 LAB — HEPARIN LEVEL (UNFRACTIONATED): Heparin Unfractionated: 0.49 IU/mL (ref 0.30–0.70)

## 2017-01-29 LAB — PROTIME-INR
INR: 1.02
PROTHROMBIN TIME: 13.4 s (ref 11.4–15.2)

## 2017-01-29 MED ORDER — WARFARIN SODIUM 7.5 MG PO TABS
7.5000 mg | ORAL_TABLET | Freq: Once | ORAL | Status: AC
  Administered 2017-01-29: 7.5 mg via ORAL
  Filled 2017-01-29: qty 1

## 2017-01-29 MED ORDER — ATORVASTATIN CALCIUM 40 MG PO TABS
40.0000 mg | ORAL_TABLET | Freq: Every day | ORAL | Status: DC
  Administered 2017-01-30: 40 mg via ORAL
  Filled 2017-01-29 (×2): qty 1

## 2017-01-29 NOTE — Progress Notes (Signed)
PROGRESS NOTE    AMSI GRIMLEY  OEU:[id number] DOB: 04-25-1956 DOA: 01/25/2017 PCP: Rogers Blocker, MD   Brief Narrative:  61 y.o. BF PMHx Essential HTN, DM type II controlled without complication, Occipital neuralgia and chronic headaches, and prior PE (spontaneous, diagnosed in 2012, does not recall hypercoag work-up, treated with warfarin for three years),Undifferentiated connective tissue disease, Dysmetabolic syndrome  Who presents to the ED for evaluation of two weeks of shortness of breath with exertion and pleuritic chest pain.  No light-headedness or dizziness.  No near syncope.  No nausea, vomiting.  She has chronic ankle edema.  She has not had any calf pain or tenderness.  No leg swelling.  She has had bilateral thigh pain.  No prolonged periods of immobility.  ED Course: CTA chest shows "Bilateral pulmonary emboli at the confluence of the bilateral lobar pulmonary arterial branches, extending to all of the lobar and segmental branches.  Large clot burden with critically increased RV/LV ratio of 1.6, suggestive of right heart strain."  She has been started on a heparin drip per protocol.  Hospitalist asked to admit.   Subjective: 6/19   A/O 4, positive SOB, positive CP, negative lower extremity pain. Positive general puritus    Assessment & Plan:   Principal Problem:   Bilateral pulmonary embolism (HCC) Active Problems:   HTN (hypertension)   DM (diabetes mellitus) (HCC)   DOE (dyspnea on exertion)   Elevated troponin   Acute bilateral PE; submassive;  -high risk.  DOE noted in the ED with minimal exertion. --PCCM consult appreciated.  No lytics but she is high risk. --Anticoagulate with IV heparin for now --Patient concerned about being on NOACs; says that her PCP did not want her using these medications in the past.  She is requesting that someone speak with her PCP Dr. Marlou Sa before discharge re: anticoagulation options. --Coumadin per pharmacy started 6/18, when  patient's INR begins to trend up could consider discharge on Lovenox to bridge until therapeutic INR  Recent Labs Lab 01/28/17 0552 01/29/17 0612  INR 1.04 1.02  -Ambulatory SPO2 pending. Expecting that patient will require home O2  Essential HTN --Hold losartan HCTZ now in case she has relative hypotension; -Cardizem 360 mg daily  DM type II controlled without complication --Hold metformin.  Low dose sliding insulin -6/17 Hemoglobin A1c= 6.1 -Lipid Panel not within ADA guidelines: Start Lipitor 40 mg daily   DVT prophylaxis: Heparin drip--> Coumadin Code Status: Full Family Communication: None Disposition Plan: Expect discharge home in 72-96 hours after Coumadin started   Consultants:  None    Procedures/Significant Events:  6/15 CT image echocardiogram PE protocol; -Bilateral pulmonary emboli at the confluence of the bilateral lobar pulmonary arterial branches, extending to all of the lobar and segmental branches. -Large clot burden with critically increased RV/LV ratio of 1.6, suggestive of right heart strain. 6/16 Echocardiogram:Left ventricle: mild LVH. -LVEF = 65%-to 70%.  -(grade 1 diastolic dysfunction). - Pulmonary arteries:  PA peak pressure: 51 mm Hg (S). 6/16 bilateral lower extremity Doppler: Negative DVT involving the right lower extremity and left lower extremity.   VENTILATOR SETTINGS: None   Cultures None  Antimicrobials: Anti-infectives    None       Devices None   LINES / TUBES:  None    Continuous Infusions: . sodium chloride 75 mL/hr at 01/28/17 2218  . heparin 900 Units/hr (01/28/17 0658)     Objective: Vitals:   01/28/17 1300 01/28/17 1812 01/28/17 2013 01/29/17 0510  BP:  128/88 (!) 141/75 126/62 134/79  Pulse: 72 88 82 79  Resp: 19 19 17 18   Temp: 98.2 F (36.8 C) 98.6 F (37 C) 98.5 F (36.9 C)   TempSrc: Oral Oral Oral   SpO2: 99% 100% 99% 99%  Weight:    166 lb 12.8 oz (75.7 kg)  Height:         Intake/Output Summary (Last 24 hours) at 01/29/17 0818 Last data filed at 01/29/17 0549  Gross per 24 hour  Intake          2137.75 ml  Output             2250 ml  Net          -112.25 ml   Filed Weights   01/26/17 0147 01/28/17 0359 01/29/17 0510  Weight: 162 lb 4.8 oz (73.6 kg) 166 lb 9.6 oz (75.6 kg) 166 lb 12.8 oz (75.7 kg)    Examination:  General: A/O 4, positive acute respiratory distress Eyes: negative scleral hemorrhage, negative anisocoria, negative icterus ENT: Negative Runny nose, negative gingival bleeding, Neck:  Negative scars, masses, torticollis, lymphadenopathy, JVD Lungs: Clear to auscultation bilaterally without wheezes or crackles Cardiovascular: Regular rate and rhythm without murmur gallop or rub normal S1 and S2 Abdomen: negative abdominal pain, nondistended, positive soft, bowel sounds, no rebound, no ascites, no appreciable mass Extremities: No significant cyanosis, clubbing, or edema bilateral lower extremities Skin: Negative rashes, lesions, ulcers Psychiatric:  Negative depression, negative anxiety, negative fatigue, negative mania  Central nervous system:  Cranial nerves II through XII intact, tongue/uvula midline, all extremities muscle strength 5/5, sensation intact throughout, negative dysarthria, negative expressive aphasia, negative receptive aphasia.  .     Data Reviewed: Care during the described time interval was provided by me .  I have reviewed this patient's available data, including medical history, events of note, physical examination, and all test results as part of my evaluation. I have personally reviewed and interpreted all radiology studies.  CBC:  Recent Labs Lab 01/25/17 1928 01/26/17 0211 01/27/17 0438 01/28/17 0552 01/29/17 0612  WBC 7.5 7.6 6.7 6.7 6.3  HGB 13.7 13.5 13.0 14.3 13.6  HCT 41.0 40.2 40.1 43.2 41.4  MCV 90.5 90.7 92.4 92.3 92.2  PLT 279 292 289 258 196   Basic Metabolic Panel:  Recent Labs Lab  01/25/17 1928 01/26/17 0740 01/27/17 0438 01/29/17 0612  NA 141 139 139 140  K 3.4* 4.8 4.1 4.1  CL 107 108 108 108  CO2 23 21* 24 26  GLUCOSE 101* 98 99 91  BUN 10 10 14 9   CREATININE 1.18* 1.12* 1.24* 1.04*  CALCIUM 9.7 9.3 9.1 9.1   GFR: Estimated Creatinine Clearance: 56.7 mL/min (A) (by C-G formula based on SCr of 1.04 mg/dL (H)). Liver Function Tests: No results for input(s): AST, ALT, ALKPHOS, BILITOT, PROT, ALBUMIN in the last 168 hours. No results for input(s): LIPASE, AMYLASE in the last 168 hours. No results for input(s): AMMONIA in the last 168 hours. Coagulation Profile:  Recent Labs Lab 01/28/17 0552 01/29/17 0612  INR 1.04 1.02   Cardiac Enzymes:  Recent Labs Lab 01/26/17 0211 01/26/17 0740 01/26/17 1250 01/27/17 0438  TROPONINI 0.06* 0.06* 0.04* 0.03*   BNP (last 3 results) No results for input(s): PROBNP in the last 8760 hours. HbA1C:  Recent Labs  01/27/17 0946  HGBA1C 6.1*   CBG:  Recent Labs Lab 01/28/17 0735 01/28/17 1133 01/28/17 1809 01/28/17 2016 01/29/17 0757  GLUCAP 86 100* 135* 83 91  Lipid Profile:  Recent Labs  01/27/17 0856  CHOL 153  HDL 60  LDLCALC 76  TRIG 86  CHOLHDL 2.6   Thyroid Function Tests: No results for input(s): TSH, T4TOTAL, FREET4, T3FREE, THYROIDAB in the last 72 hours. Anemia Panel: No results for input(s): VITAMINB12, FOLATE, FERRITIN, TIBC, IRON, RETICCTPCT in the last 72 hours. Urine analysis: No results found for: COLORURINE, APPEARANCEUR, LABSPEC, [place], GLUCOSEU, HGBUR, BILIRUBINUR, KETONESUR, PROTEINUR, UROBILINOGEN, NITRITE, LEUKOCYTESUR Sepsis Labs: @LABRCNTIP (procalcitonin:4,lacticidven:4)  ) Recent Results (from the past 240 hour(s))  MRSA PCR Screening     Status: None   Collection Time: 01/26/17  1:51 AM  Result Value Ref Range Status   MRSA by PCR NEGATIVE NEGATIVE Final    Comment:        The GeneXpert MRSA Assay (FDA approved for NASAL specimens only), is one  component of a comprehensive MRSA colonization surveillance program. It is not intended to diagnose MRSA infection nor to guide or monitor treatment for MRSA infections.          Radiology Studies: No results found.      Scheduled Meds: . diltiazem  360 mg Oral Daily  . gabapentin  300 mg Oral QHS  . insulin aspart  0-9 Units Subcutaneous TID WC  . mouth rinse  15 mL Mouth Rinse BID  . montelukast  10 mg Oral QHS  . sodium chloride flush  3 mL Intravenous Q12H  . warfarin  7.5 mg Oral ONCE-1800  . warfarin   Does not apply Once  . Warfarin - Pharmacist Dosing Inpatient   Does not apply q1800   Continuous Infusions: . sodium chloride 75 mL/hr at 01/28/17 2218  . heparin 900 Units/hr (01/28/17 0658)     LOS: 3 days    Time spent: 25 minutes    WOODS, Geraldo Docker, MD [facility] Pager [phone]   If 7PM-7AM, please contact night-coverage [email or web address] Password [facility] 01/29/2017, 8:18 AM

## 2017-01-29 NOTE — Progress Notes (Signed)
ANTICOAGULATION CONSULT NOTE - Follow Up Consult  Pharmacy Consult for Heparin and warfarin  Indication: pulmonary embolus  Allergies  Allergen Reactions  . Morphine And Related     Generalized itching    Patient Measurements: Height: 5\' 3"  (160 cm) Weight: 166 lb 12.8 oz (75.7 kg) IBW/kg (Calculated) : 52.4 Heparin Dosing Weight:  68 kg   Vital Signs: Temp: 98.5 F (36.9 C) (06/18 2013) Temp Source: Oral (06/18 2013) BP: 134/79 (06/19 0510) Pulse Rate: 79 (06/19 0510)  Labs:  Recent Labs  01/26/17 1250  01/27/17 0438 01/28/17 0552 01/28/17 1540 01/29/17 0612  HGB  --   < > 13.0 14.3  --  13.6  HCT  --   --  40.1 43.2  --  41.4  PLT  --   --  289 258  --  279  LABPROT  --   --   --  13.6  --  13.4  INR  --   --   --  1.04  --  1.02  HEPARINUNFRC 0.60  < > 0.56 0.74* 0.53 0.49  CREATININE  --   --  1.24*  --   --  1.04*  TROPONINI 0.04*  --  0.03*  --   --   --   < > = values in this interval not displayed.  Estimated Creatinine Clearance: 56.7 mL/min (A) (by C-G formula based on SCr of 1.04 mg/dL (H)).   Assessment: Heparin for bilateral PE with large clot burden + RHS. Currently therapeutic on heparin gtt and INR 1.02 after starting warfarin on 6/18. Today is day 2 of warfarin and heparin overlap.    Goal of Therapy:  INR 2-3 Heparin level 0.3-0.7 units/ml Monitor platelets by anticoagulation protocol: Yes   Plan:  1. Continue IV heparin 900 units/hr 2. Warfarin 7.5 mg x 1 this evening  3. Daily heparin level, INR and CBC every 72 hours    Vincenza Hews, PharmD, BCPS 01/29/2017, 7:53 AM

## 2017-01-30 LAB — CBC
HCT: 43.8 % (ref 36.0–46.0)
Hemoglobin: 14.6 g/dL (ref 12.0–15.0)
MCH: 30.6 pg (ref 26.0–34.0)
MCHC: 33.3 g/dL (ref 30.0–36.0)
MCV: 91.8 fL (ref 78.0–100.0)
PLATELETS: 244 10*3/uL (ref 150–400)
RBC: 4.77 MIL/uL (ref 3.87–5.11)
RDW: 13.4 % (ref 11.5–15.5)
WBC: 6.4 10*3/uL (ref 4.0–10.5)

## 2017-01-30 LAB — GLUCOSE, CAPILLARY
Glucose-Capillary: 86 mg/dL (ref 65–99)
Glucose-Capillary: 104 mg/dL — ABNORMAL HIGH (ref 65–99)
Glucose-Capillary: 83 mg/dL (ref 65–99)
Glucose-Capillary: 101 mg/dL — ABNORMAL HIGH (ref 65–99)

## 2017-01-30 LAB — PROTIME-INR
INR: 1.02
PROTHROMBIN TIME: 13.4 s (ref 11.4–15.2)

## 2017-01-30 LAB — HEPARIN LEVEL (UNFRACTIONATED): HEPARIN UNFRACTIONATED: 0.54 [IU]/mL (ref 0.30–0.70)

## 2017-01-30 MED ORDER — WARFARIN SODIUM 10 MG PO TABS
10.0000 mg | ORAL_TABLET | Freq: Once | ORAL | Status: AC
  Administered 2017-01-30: 10 mg via ORAL
  Filled 2017-01-30: qty 1

## 2017-01-30 NOTE — Progress Notes (Signed)
ANTICOAGULATION CONSULT NOTE - Follow Up Consult  Pharmacy Consult for Heparin and warfarin  Indication: pulmonary embolus  Allergies  Allergen Reactions  . Morphine And Related     Generalized itching    Patient Measurements: Height: 5\' 3"  (160 cm) Weight: 167 lb 3.2 oz (75.8 kg) IBW/kg (Calculated) : 52.4 Heparin Dosing Weight:  68 kg   Vital Signs: Temp: 97.4 F (36.3 C) (06/20 0756) Temp Source: Oral (06/20 0756) BP: 135/87 (06/20 0427) Pulse Rate: 73 (06/20 0427)  Labs:  Recent Labs  01/28/17 0552 01/28/17 1540 01/29/17 0612 01/30/17 0416  HGB 14.3  --  13.6 14.6  HCT 43.2  --  41.4 43.8  PLT 258  --  279 244  LABPROT 13.6  --  13.4 13.4  INR 1.04  --  1.02 1.02  HEPARINUNFRC 0.74* 0.53 0.49 0.54  CREATININE  --   --  1.04*  --     Estimated Creatinine Clearance: 56.8 mL/min (A) (by C-G formula based on SCr of 1.04 mg/dL (H)).   Assessment: Heparin for bilateral PE with large clot burden + RHS. Currently therapeutic on heparin gtt at 900 units/hr. INR remains labile after two doses of warfarin. CBC stable.    Goal of Therapy:  INR 2-3 Heparin level 0.3-0.7 units/ml Monitor platelets by anticoagulation protocol: Yes   Plan:  1. Continue IV heparin 900 units/hr 2. Increase Warfarin to 10 mg x 1 this evening  3. Daily heparin level, INR and CBC every 72 hours  4. Noted plan to potentially discharge on Lovenox/warfarin bridge once INR begins to increase     Vincenza Hews, PharmD, BCPS 01/30/2017, 8:58 AM

## 2017-01-30 NOTE — Progress Notes (Signed)
[place] TEAM 1 - Stepdown/ICU TEAM  [name]  DGU:[id number] DOB: [birth year] DOA: 01/25/2017 PCP: Rogers Blocker, MD    Brief Narrative:  61 y.o.F Hx HTN, DM II, Occipital neuralgia w/ chronic headaches, prior PE (spontaneous, diagnosed in 2012, does not recall hypercoag work-up, treated with warfarin for three years), and undifferentiated connective tissue disease who presented to the ED w/ two weeks of shortness of breath with exertion and pleuritic chest pain.  CTA noted "Bilateral pulmonary emboli at the confluence of the bilateral lobar pulmonary arterial branches, extending to all of the lobar and segmental branches. Large clot burden with critically increased RV/LV ratio of 1.6, suggestive of right heart strain."   Subjective: Pt reports some coughing, as well as some ongoing chest pain of a pleuritic nature.  She denies n/v, abdom pain, or SSCP.  She is adamantly opposed to using lovenox at home.    Assessment & Plan:  Acute bilateral PE IV heparin for now - Patient concerned about being on NOACs; says that her PCP did not want her using these medications in the past - Coumadin per pharmacy to continue - pt understands that overlap w/ heparin > warfarin will be required - discussed option of transitioning to home lovenox but she is not interested  Essential HTN No change in tx at this time   DM II Well controlled at present   Very mild acute kidney injury Improved w/ hydration  DVT prophylaxis: heparin > coumadin  Code Status: FULL CODE Family Communication: no family present at time of exam  Disposition Plan: transfer to tele - PT/OT - await therapeutic INR w/ appropriate overlap   Consultants:  PCCM  Procedures: 6/16 TTE EF 65-70% - grade 1 diastolic dysfunction - PA peak pressure: 51 mm Hg 6/16 B lower extremity Doppler Negative DVT   Antimicrobials:  none  Objective: Blood pressure 135/87, pulse 73, temperature 97.4 F (36.3 C), temperature source Oral,  resp. rate 13, height 5\' 3"  (1.6 m), weight 75.8 kg (167 lb 3.2 oz), SpO2 99 %.  Intake/Output Summary (Last 24 hours) at 01/30/17 1157 Last data filed at 01/30/17 0700  Gross per 24 hour  Intake             1572 ml  Output             2100 ml  Net             -528 ml   Filed Weights   01/28/17 0359 01/29/17 0510 01/30/17 0427  Weight: 75.6 kg (166 lb 9.6 oz) 75.7 kg (166 lb 12.8 oz) 75.8 kg (167 lb 3.2 oz)    Examination: General: No acute respiratory distress - alert  Lungs: CTA B w/o wheeze  Cardiovascular: RRR Abdomen: Nontender, nondistended, soft, bowel sounds positive Extremities: No C/C/Edema B LE   CBC:  Recent Labs Lab 01/26/17 0211 01/27/17 0438 01/28/17 0552 01/29/17 0612 01/30/17 0416  WBC 7.6 6.7 6.7 6.3 6.4  HGB 13.5 13.0 14.3 13.6 14.6  HCT 40.2 40.1 43.2 41.4 43.8  MCV 90.7 92.4 92.3 92.2 91.8  PLT 292 289 258 279 956   Basic Metabolic Panel:  Recent Labs Lab 01/25/17 1928 01/26/17 0740 01/27/17 0438 01/29/17 0612  NA 141 139 139 140  K 3.4* 4.8 4.1 4.1  CL 107 108 108 108  CO2 23 21* 24 26  GLUCOSE 101* 98 99 91  BUN 10 10 14 9   CREATININE 1.18* 1.12* 1.24* 1.04*  CALCIUM 9.7 9.3  9.1 9.1   GFR: Estimated Creatinine Clearance: 56.8 mL/min (A) (by C-G formula based on SCr of 1.04 mg/dL (H)).  Liver Function Tests: No results for input(s): AST, ALT, ALKPHOS, BILITOT, PROT, ALBUMIN in the last 168 hours. No results for input(s): LIPASE, AMYLASE in the last 168 hours. No results for input(s): AMMONIA in the last 168 hours.  Coagulation Profile:  Recent Labs Lab 01/28/17 0552 01/29/17 0612 01/30/17 0416  INR 1.04 1.02 1.02    Cardiac Enzymes:  Recent Labs Lab 01/26/17 0211 01/26/17 0740 01/26/17 1250 01/27/17 0438  TROPONINI 0.06* 0.06* 0.04* 0.03*    HbA1C: Hgb A1c MFr Bld  Date/Time Value Ref Range Status  01/27/2017 09:46 AM 6.1 (H) 4.8 - 5.6 % Final    Comment:    (NOTE)         Pre-diabetes: 5.7 - 6.4          Diabetes: >6.4         Glycemic control for adults with diabetes: <7.0   05/31/2012 6.5 (A) 4.0 - 6.0 % Final    CBG:  Recent Labs Lab 01/29/17 0757 01/29/17 1128 01/29/17 1608 01/29/17 2156 01/30/17 0753  GLUCAP 91 81 96 103* 86    Recent Results (from the past 240 hour(s))  MRSA PCR Screening     Status: None   Collection Time: 01/26/17  1:51 AM  Result Value Ref Range Status   MRSA by PCR NEGATIVE NEGATIVE Final    Comment:        The GeneXpert MRSA Assay (FDA approved for NASAL specimens only), is one component of a comprehensive MRSA colonization surveillance program. It is not intended to diagnose MRSA infection nor to guide or monitor treatment for MRSA infections.      Scheduled Meds: . atorvastatin  40 mg Oral q1800  . diltiazem  360 mg Oral Daily  . gabapentin  300 mg Oral QHS  . insulin aspart  0-9 Units Subcutaneous TID WC  . mouth rinse  15 mL Mouth Rinse BID  . montelukast  10 mg Oral QHS  . sodium chloride flush  3 mL Intravenous Q12H  . warfarin  10 mg Oral ONCE-1800  . Warfarin - Pharmacist Dosing Inpatient   Does not apply q1800   Continuous Infusions: . sodium chloride 75 mL/hr at 01/30/17 0145  . heparin 900 Units/hr (01/29/17 1236)     LOS: 4 days   Cherene Altes, MD [facility] Office  [phone] Pager - Text Page per Amion as per below:  On-Call/Text Page:      [email or web address]      password [email or web address]  If 7PM-7AM, please contact night-coverage [email or web address] Password [facility] 01/30/2017, 11:57 AM

## 2017-01-30 NOTE — Discharge Instructions (Addendum)

## 2017-01-31 LAB — GLUCOSE, CAPILLARY
GLUCOSE-CAPILLARY: 92 mg/dL (ref 65–99)
GLUCOSE-CAPILLARY: 95 mg/dL (ref 65–99)
GLUCOSE-CAPILLARY: 138 mg/dL — AB (ref 65–99)
Glucose-Capillary: 103 mg/dL — ABNORMAL HIGH (ref 65–99)

## 2017-01-31 LAB — PROTIME-INR
INR: 1.07
Prothrombin Time: 13.9 seconds (ref 11.4–15.2)

## 2017-01-31 LAB — CBC
HEMATOCRIT: 43.3 % (ref 36.0–46.0)
HEMOGLOBIN: 14.5 g/dL (ref 12.0–15.0)
MCH: 30.7 pg (ref 26.0–34.0)
MCHC: 33.5 g/dL (ref 30.0–36.0)
MCV: 91.5 fL (ref 78.0–100.0)
Platelets: 305 10*3/uL (ref 150–400)
RBC: 4.73 MIL/uL (ref 3.87–5.11)
RDW: 13.5 % (ref 11.5–15.5)
WBC: 6.8 10*3/uL (ref 4.0–10.5)

## 2017-01-31 LAB — HEPARIN LEVEL (UNFRACTIONATED): HEPARIN UNFRACTIONATED: 0.44 [IU]/mL (ref 0.30–0.70)

## 2017-01-31 MED ORDER — RIVAROXABAN 15 MG PO TABS: 15.0000 mg | ORAL_TABLET | Freq: Two times a day (BID) | ORAL | Status: DC

## 2017-01-31 MED ORDER — RIVAROXABAN 15 MG PO TABS
15.0000 mg | ORAL_TABLET | Freq: Two times a day (BID) | ORAL | Status: DC
  Administered 2017-01-31 – 2017-02-01 (×3): 15 mg via ORAL
  Filled 2017-01-31 (×3): qty 1

## 2017-01-31 MED ORDER — HEPARIN (PORCINE) IN NACL 100-0.45 UNIT/ML-% IJ SOLN
900.0000 [IU]/h | INTRAMUSCULAR | Status: DC
  Administered 2017-01-31: 900 [IU]/h via INTRAVENOUS

## 2017-01-31 MED ORDER — RIVAROXABAN 15 MG PO TABS
15.0000 mg | ORAL_TABLET | Freq: Two times a day (BID) | ORAL | Status: DC
  Filled 2017-01-31: qty 1

## 2017-01-31 NOTE — Progress Notes (Addendum)
ANTICOAGULATION CONSULT NOTE - Follow Up Consult  Pharmacy Consult for Heparin and warfarin to Xarelto  Indication: pulmonary embolus  Allergies  Allergen Reactions  . Morphine And Related     Generalized itching    Patient Measurements: Height: 5\' 3"  (160 cm) Weight: 165 lb 12.8 oz (75.2 kg) IBW/kg (Calculated) : 52.4 Heparin Dosing Weight:  68 kg   Vital Signs: Temp: 98 F (36.7 C) (06/21 0745) Temp Source: Oral (06/21 0745) BP: 128/80 (06/21 0745) Pulse Rate: 72 (06/21 0745)  Labs:  Recent Labs  01/29/17 0612 01/30/17 0416 01/31/17 0519  HGB 13.6 14.6 14.5  HCT 41.4 43.8 43.3  PLT 279 244 305  LABPROT 13.4 13.4 13.9  INR 1.02 1.02 1.07  HEPARINUNFRC 0.49 0.54 0.44  CREATININE 1.04*  --   --     Estimated Creatinine Clearance: 56.5 mL/min (A) (by C-G formula based on SCr of 1.04 mg/dL (H)).   Assessment: Heparin for bilateral PE with large clot burden + RHS. Currently receiving heparin/warfarin but to transition to Xarelto for further treatment. CrCl > 50 ml/min, CBC stable.     Goal of Therapy:  Monitor platelets by anticoagulation protocol: Yes   Plan:  1. Begin Xarelto 15 mg BID for 21 days followed by 20 mg daily thereafter; per primary team planning life long anticoagulation with second PE occurrence  2. Discontinue heparin and warfarin  3. Educate   Vincenza Hews, PharmD, BCPS 01/31/2017, 10:08 AM   Addendum:  Patient is refusing to start taking Xarelto until benefits check is completed and she knows what the copay will be. Spoke with Dr. Wendee Beavers and plan to continue heparin infusion only for now with plans to start and or revisit treatment with Xarelto on 6/22 am. For now will continue heparin infusion at 900 units/hr with daily heparin level.   Vincenza Hews, PharmD, BCPS 01/31/2017, 3:02 PM

## 2017-01-31 NOTE — Progress Notes (Signed)
Claudia  NATORI Anderson  KVQ:[id number] DOB: July 29, 1956 DOA: 01/25/2017 PCP: Rogers Blocker, MD    Brief Narrative:  61 y.o.F Hx HTN, DM II, Occipital neuralgia w/ chronic headaches, prior PE (spontaneous, diagnosed in 2012, does not recall hypercoag work-up, treated with warfarin for three years), and undifferentiated connective tissue disease who presented to the ED w/ two weeks of shortness of breath with exertion and pleuritic chest pain.  CTA noted "Bilateral pulmonary emboli at the confluence of the bilateral lobar pulmonary arterial branches, extending to all of the lobar and segmental branches. Large clot burden with critically increased RV/LV ratio of 1.6, suggestive of right heart strain."   Subjective: Pt wanted to know what her INR level was today. No other complaints. States that she is short of breath with activity  Assessment & Plan:  Acute bilateral PE IV heparin for now with bridging to Coumadin. Patient had discussion with her primary care physician who recommended Xarelto now she's considering. Discussed with case manager who is looking into NOAC options. Xarelto reportedly cost patient $30  Essential HTN - Continue Cardizem  DM II - stable on sliding scale insulin - Continue diabetic diet  Very mild acute kidney injury Resolved with serum creatinine at 1.0  DVT prophylaxis: heparin > coumadin  Code Status: FULL CODE Family Communication: None at bedside. No requests made to update any other family member Disposition Plan: Pt considering xarelto as option, for now would like to continue coumadin  Consultants:  PCCM  Procedures: 6/16 TTE EF 65-70% - grade 1 diastolic dysfunction - PA peak pressure: 51 mm Hg 6/16 B lower extremity Doppler Negative DVT   Antimicrobials:  none  Objective: Blood pressure 130/84, pulse 72, temperature 97.6 F (36.4 C), temperature source Oral, resp. rate 11, height 5\' 3"  (1.6 m), weight 75.2 kg (165 lb 12.8 oz), SpO2 98  %.  Intake/Output Summary (Last 24 hours) at 01/31/17 1652 Last data filed at 01/31/17 1300  Gross per 24 hour  Intake          1347.58 ml  Output             1700 ml  Net          -352.42 ml   Filed Weights   01/29/17 0510 01/30/17 0427 01/31/17 0444  Weight: 75.7 kg (166 lb 12.8 oz) 75.8 kg (167 lb 3.2 oz) 75.2 kg (165 lb 12.8 oz)    Examination: General: Alert and awake, in nad. Lungs: Equal chest rise, no rales, no wheezes Cardiovascular: RRR, no murmurs Abdomen: Nontender, nondistended, soft, bowel sounds positive Extremities: warm, equal ROM  CBC:  Recent Labs Lab 01/27/17 0438 01/28/17 0552 01/29/17 0612 01/30/17 0416 01/31/17 0519  WBC 6.7 6.7 6.3 6.4 6.8  HGB 13.0 14.3 13.6 14.6 14.5  HCT 40.1 43.2 41.4 43.8 43.3  MCV 92.4 92.3 92.2 91.8 91.5  PLT 289 258 279 244 643   Basic Metabolic Panel:  Recent Labs Lab 01/25/17 1928 01/26/17 0740 01/27/17 0438 01/29/17 0612  NA 141 139 139 140  K 3.4* 4.8 4.1 4.1  CL 107 108 108 108  CO2 23 21* 24 26  GLUCOSE 101* 98 99 91  BUN 10 10 14 9   CREATININE 1.18* 1.12* 1.24* 1.04*  CALCIUM 9.7 9.3 9.1 9.1   GFR: Estimated Creatinine Clearance: 56.5 mL/min (A) (by C-G formula based on SCr of 1.04 mg/dL (H)).  Liver Function Tests: No results for input(s): AST, ALT, ALKPHOS, BILITOT, PROT, ALBUMIN in the  last 168 hours. No results for input(s): LIPASE, AMYLASE in the last 168 hours. No results for input(s): AMMONIA in the last 168 hours.  Coagulation Profile:  Recent Labs Lab 01/28/17 0552 01/29/17 0612 01/30/17 0416 01/31/17 0519  INR 1.04 1.02 1.02 1.07    Cardiac Enzymes:  Recent Labs Lab 01/26/17 0211 01/26/17 0740 01/26/17 1250 01/27/17 0438  TROPONINI 0.06* 0.06* 0.04* 0.03*    HbA1C: Hgb A1c MFr Bld  Date/Time Value Ref Range Status  01/27/2017 09:46 AM 6.1 (H) 4.8 - 5.6 % Final    Comment:    (NOTE)         Pre-diabetes: 5.7 - 6.4         Diabetes: >6.4         Glycemic control  for adults with diabetes: <7.0   05/31/2012 6.5 (A) 4.0 - 6.0 % Final    CBG:  Recent Labs Lab 01/30/17 1223 01/30/17 1658 01/30/17 2016 01/31/17 0725 01/31/17 1127  GLUCAP 104* 83 101* 92 103*    Recent Results (from the past 240 hour(s))  MRSA PCR Screening     Status: None   Collection Time: 01/26/17  1:51 AM  Result Value Ref Range Status   MRSA by PCR NEGATIVE NEGATIVE Final    Comment:        The GeneXpert MRSA Assay (FDA approved for NASAL specimens only), is one component of a comprehensive MRSA colonization surveillance program. It is not intended to diagnose MRSA infection nor to guide or monitor treatment for MRSA infections.      Scheduled Meds: . atorvastatin  40 mg Oral q1800  . diltiazem  360 mg Oral Daily  . gabapentin  300 mg Oral QHS  . insulin aspart  0-9 Units Subcutaneous TID WC  . mouth rinse  15 mL Mouth Rinse BID  . montelukast  10 mg Oral QHS  . sodium chloride flush  3 mL Intravenous Q12H   Continuous Infusions: . sodium chloride 10 mL/hr at 01/31/17 0005  . heparin       LOS: 5 days   Velvet Bathe , MD [facility] Office  [phone] Pager - Text Page per Shea Evans as per below:  On-Call/Text Page:      [email or web address]      password [email or web address]  If 7PM-7AM, please contact night-coverage [email or web address] Password [email or web address] 01/31/2017, 4:52 PM

## 2017-01-31 NOTE — Progress Notes (Signed)
Patient refused xarelto earlier today due to insurance concerns when she fills it after discharge.  Issue researched, she's covered by insurance, and she was provided with a discount card as well.  Paged Dr Wendee Beavers, to relay these issues and concerns.

## 2017-01-31 NOTE — Care Management Note (Addendum)
Case Management Note  Patient Details  Name: [name] MRN: [id number] Date of Birth: [birth year]  Subjective/Objective:  Pt presented for SOB x 2 weeks. Positive for Bilateral PE. Initiated on IV heparin gtt and coumadin bridge. CM has benefits check in process for Pradaxa and Xarelto. Pt was discussed in Progression and Pradaxa was discussed.                   Action/Plan: CM will make patient/ MD aware of cost once completed.   Expected Discharge Date:                  Expected Discharge Plan:  Home/Self Care  In-House Referral:  NA  Discharge planning Services  CM Consult, Medication Assistance  Post Acute Care Choice:  NA Choice offered to:  NA  DME Arranged:  N/A DME Agency:  NA  HH Arranged:  NA HH Agency:  NA  Status of Service:  Completed, signed off  If discussed at [place] of Stay Meetings, dates discussed:    Additional Comments: Benefits Check completed  1622 01-31-17 Jacqlyn Krauss, RN,BSN [phone] CM did text page MD at time of notification of coverage.  Pradaxa will need prior authorization:   PRADAXA  150 MG BID PO   COVER- NOT COVER  PRIOR APPROVAL - YES # [id number] FOR EXCEPTION   PREFERRED :  2 .XARELTO 15 MG BID   COVER- YES  CO-PAY- $ 30.00  TIER- 2 DRUG  PRIOR APPROVAL- NO   3 . XARELTO 20 MG DAILY  COVER- YES  SAME AS ABOVE   PREFERRED:   4 ELIQUIS 2.5 MG BID   COVER- YES  CO-PAY- $ 30.00  TIER- 2 DRUG  PRIOR APPROVAL- NO   5. ELIQUIS 5 MG BID   COVER-YES  SAME AS ABOVE   [facility]   CM did provide pt with 30 day free / co pay card. [facility] called and starter kit is not  available. [facility]. Pt is aware. Pt will need a paper Rx for starter Kit and pt wants [facility] initially. No further needs at this time.  Bethena Roys, RN 01/31/2017, 11:24 AM

## 2017-02-01 LAB — GLUCOSE, CAPILLARY
Glucose-Capillary: 115 mg/dL — ABNORMAL HIGH (ref 65–99)
Glucose-Capillary: 106 mg/dL — ABNORMAL HIGH (ref 65–99)

## 2017-02-01 MED ORDER — RIVAROXABAN (XARELTO) VTE STARTER PACK (15 & 20 MG): ORAL_TABLET | ORAL | 0 refills | Status: DC

## 2017-02-01 MED ORDER — RIVAROXABAN 20 MG PO TABS: 20.0000 mg | ORAL_TABLET | Freq: Every day | ORAL | Status: DC

## 2017-02-01 NOTE — Discharge Summary (Signed)
Physician Discharge Summary  [name] GDJ:[id number] DOB: 07-22-1956 DOA: 01/25/2017  PCP: Claudia Blocker, MD  Admit date: 01/25/2017 Discharge date: 02/01/2017  Time spent: > 35 minutes  Recommendations for Outpatient Follow-up:  1. Monitor blood sugars and adjust hypoglycemic agents accordingly 2. Patient will be discharged on xarelto xarelto starter pack 3. Please decide if patient will need further time off of work. I have recommended 1 week off of work and filled out [employer] paperwork   Discharge Diagnoses:  Principal Problem:   Bilateral pulmonary embolism ([place]) Active Problems:   HTN (hypertension)   DM (diabetes mellitus) ([place])   DOE (dyspnea on exertion)   Elevated troponin   Discharge Condition: stable  Diet recommendation: heart healthy/carb modified  Filed Weights   01/30/17 0427 01/31/17 0444 02/01/17 0645  Weight: 75.8 kg (167 lb 3.2 oz) 75.2 kg (165 lb 12.8 oz) 74.6 kg (164 lb 6.4 oz)    History of present illness:  61 y.o.F Hx HTN, DM II, Occipital neuralgia w/ chronic headaches, prior PE (spontaneous, diagnosed in 2012,does not recall hypercoag work-up, treated with warfarin for three years), and undifferentiated connective tissue disease who presented to the ED w/ two weeks of shortness of breath with exertion and pleuritic chest pain.  CTA noted "Bilateral pulmonary emboli at the confluence of the bilateral lobar pulmonary arterial branches, extending to all of the lobar and segmental branches. Large clot burden with critically increased RV/LV ratio of 1.6, suggestive of right heart strain."   Hospital Course:  Acute bilateral PE Patient was initially on IV heparin bridge to Coumadin. But after discussion with primary care physician patient would like to try and no before meals. Xarelto reportedly cost patient $30 - Patient requested I fill out FMLA paperwork which was completed and placed on chart - critical care team (pulmonology) please see discussion  below.  Essential HTN - Continue Cardizem on discharge - Will hold Hyzaar on d/c given normal values on cardizem.  Lipid Panel reviewed and results discussed with patient. I do not thing patient requires lipid lowering medication on review given LDL of 76 and cholesterol of 153.   DM II - Diabetic diet - continue metformin on discharge. Last creatinine 1.0  Very mild acute kidney injury Resolved with serum creatinine at 1.0  Procedures:  Please see imaging studies listed below  Consultations:  Critical care: Further evaluation and recommended the following Given the subacute nature of patient's symptoms, I suspect the PE has been present for the past 2 weeks. Therefore at this time I am less inclined to recommend catheter directed TPA, especially given her history of GIB. Certainly if her clinical condition deteriorates we will re-evaluate this decision.  Patient did not have any deterioration in hospital course and reported improvement in respiratory condition  Discharge Exam: Vitals:   01/31/17 1950 02/01/17 0645  BP: 137/88 120/77  Pulse: 79 71  Resp: 17 15  Temp:  98 F (36.7 C)    General: Pt in nad, alert and awake Cardiovascular: rrr, no rubs Respiratory: no increased wob, no wheezes, speaking in full sentences  Discharge Instructions   Discharge Instructions    Call MD for:  difficulty breathing, headache or visual disturbances    Complete by:  As directed    Call MD for:  temperature >100.4    Complete by:  As directed    Diet - low sodium heart healthy    Complete by:  As directed    Discharge instructions  Complete by:  As directed    Please history follow-up with your primary care physician for further evaluation recommendations   Increase activity slowly    Complete by:  As directed      Current Discharge Medication List    START taking these medications   Details  Rivaroxaban 15 & 20 MG TBPK Take as directed on package: Start with one  15mg  tablet by mouth twice a day with food. On Day 22, switch to one 20mg  tablet once a day with food. Qty: 51 each, Refills: 0      CONTINUE these medications which have NOT CHANGED   Details  Biotin 1000 MCG tablet Take 1,000 mcg by mouth at bedtime.     cholecalciferol (VITAMIN D) 1000 UNITS tablet Take 1,000 Units by mouth daily.    diltiazem (CARDIZEM CD) 360 MG 24 hr capsule Take 360 mg by mouth daily.    fluticasone (FLONASE) 50 MCG/ACT nasal spray Place 2 sprays into both nostrils daily. Reported on 08/08/2015 Qty: 16 g, Refills: 11    fluticasone (FLOVENT HFA) 44 MCG/ACT inhaler Inhale 2 puffs into the lungs 2 (two) times daily. Qty: 1 Inhaler, Refills: 3   Associated Diagnoses: Dyspnea on exertion    gabapentin (NEURONTIN) 300 MG capsule Take 300 mg by mouth at bedtime.     metFORMIN (GLUCOPHAGE) 500 MG tablet Take 500 mg by mouth every evening.     montelukast (SINGULAIR) 10 MG tablet Take 10 mg by mouth daily. Refills: 1    XOPENEX HFA 45 MCG/ACT inhaler Inhale 1-2 puffs into the lungs every 6 (six) hours as needed for wheezing. Reported on 08/08/2015      STOP taking these medications     losartan-hydrochlorothiazide (HYZAAR) 100-12.5 MG per tablet        Allergies  Allergen Reactions  . Morphine And Related     Generalized itching      The results of significant diagnostics from this hospitalization (including imaging, microbiology, ancillary and laboratory) are listed below for reference.    Significant Diagnostic Studies: Dg Chest 2 View  Result Date: 01/25/2017 CLINICAL DATA:  Left chest pain and shortness of breath for 2 weeks. History of pulmonary embolus EXAM: CHEST  2 VIEW COMPARISON:  06/02/2015 chest radiograph FINDINGS: The heart size and mediastinal contours are within normal limits. Both lungs are clear. The visualized skeletal structures are unremarkable. IMPRESSION: No significant radiographic abnormality is identified. Electronically Signed    By: Van Clines M.D.   On: 01/25/2017 20:02   Ct Angio Chest Pe W Or Wo Contrast  Result Date: 01/25/2017 CLINICAL DATA:  Shortness of breath. EXAM: CT ANGIOGRAPHY CHEST WITH CONTRAST TECHNIQUE: Multidetector CT imaging of the chest was performed using the standard protocol during bolus administration of intravenous contrast. Multiplanar CT image reconstructions and MIPs were obtained to evaluate the vascular anatomy. CONTRAST:  75 cc Isovue 370 intravenously. COMPARISON:  Chest radiograph 01/25/2017 FINDINGS: Cardiovascular: Satisfactory opacification of the pulmonary arteries to the segmental level. There are bilateral pulmonary emboli at the level of the bifurcation of the lobar pulmonary arterial branches, and extending to all of the lobar and segmental branches. There is a large clot burden. RV/LV ratio equals 1.6. Mediastinum/Nodes: No enlarged mediastinal, hilar, or axillary lymph nodes. Thyroid gland, trachea, and esophagus demonstrate no significant findings. Lungs/Pleura: Lungs are clear. No pleural effusion or pneumothorax. Upper Abdomen: No acute abnormality. Musculoskeletal: No chest wall abnormality. No acute or significant osseous findings. Review of the MIP images confirms  the above findings. IMPRESSION: Bilateral pulmonary emboli at the confluence of the bilateral lobar pulmonary arterial branches, extending to all of the lobar and segmental branches. Large clot burden with critically increased RV/LV ratio of 1.6, suggestive of right heart strain. These results were called by telephone at the time of interpretation on 01/25/2017 at 10:21 pm to Dr. Orlie Dakin , who verbally acknowledged these results. Electronically Signed   By: Fidela Salisbury M.D.   On: 01/25/2017 22:23    Microbiology: Recent Results (from the past 240 hour(s))  MRSA PCR Screening     Status: None   Collection Time: 01/26/17  1:51 AM  Result Value Ref Range Status   MRSA by PCR NEGATIVE NEGATIVE Final     Comment:        The GeneXpert MRSA Assay (FDA approved for NASAL specimens only), is one component of a comprehensive MRSA colonization surveillance program. It is not intended to diagnose MRSA infection nor to guide or monitor treatment for MRSA infections.      Labs: Basic Metabolic Panel:  Recent Labs Lab 01/25/17 1928 01/26/17 0740 01/27/17 0438 01/29/17 0612  NA 141 139 139 140  K 3.4* 4.8 4.1 4.1  CL 107 108 108 108  CO2 23 21* 24 26  GLUCOSE 101* 98 99 91  BUN 10 10 14 9   CREATININE 1.18* 1.12* 1.24* 1.04*  CALCIUM 9.7 9.3 9.1 9.1   Liver Function Tests: No results for input(s): AST, ALT, ALKPHOS, BILITOT, PROT, ALBUMIN in the last 168 hours. No results for input(s): LIPASE, AMYLASE in the last 168 hours. No results for input(s): AMMONIA in the last 168 hours. CBC:  Recent Labs Lab 01/27/17 0438 01/28/17 0552 01/29/17 0612 01/30/17 0416 01/31/17 0519  WBC 6.7 6.7 6.3 6.4 6.8  HGB 13.0 14.3 13.6 14.6 14.5  HCT 40.1 43.2 41.4 43.8 43.3  MCV 92.4 92.3 92.2 91.8 91.5  PLT 289 258 279 244 305   Cardiac Enzymes:  Recent Labs Lab 01/26/17 0211 01/26/17 0740 01/26/17 1250 01/27/17 0438  TROPONINI 0.06* 0.06* 0.04* 0.03*   BNP: BNP (last 3 results)  Recent Labs  01/26/17 0211 01/27/17 0438  BNP 417.7* 156.2*    ProBNP (last 3 results) No results for input(s): PROBNP in the last 8760 hours.  CBG:  Recent Labs Lab 01/31/17 1127 01/31/17 1707 01/31/17 1953 02/01/17 0733 02/01/17 1121  GLUCAP 103* 95 138* 115* 106*   Signed:  Velvet Bathe MD.  [facility] 02/01/2017, 1:52 PM

## 2017-04-25 ENCOUNTER — Ambulatory Visit (INDEPENDENT_AMBULATORY_CARE_PROVIDER_SITE_OTHER): Payer: BC Managed Care – PPO | Admitting: Interventional Cardiology

## 2017-04-25 ENCOUNTER — Encounter: Payer: Self-pay | Admitting: Interventional Cardiology

## 2017-04-25 VITALS — BP 150/92 | HR 102 | Ht 63.0 in | Wt 171.4 lb

## 2017-04-25 DIAGNOSIS — E119 Type 2 diabetes mellitus without complications: Secondary | ICD-10-CM

## 2017-04-25 DIAGNOSIS — Z794 Long term (current) use of insulin: Secondary | ICD-10-CM

## 2017-04-25 DIAGNOSIS — I2781 Cor pulmonale (chronic): Secondary | ICD-10-CM

## 2017-04-25 DIAGNOSIS — M351 Other overlap syndromes: Secondary | ICD-10-CM

## 2017-04-25 DIAGNOSIS — D6859 Other primary thrombophilia: Secondary | ICD-10-CM

## 2017-04-25 DIAGNOSIS — I749 Embolism and thrombosis of unspecified artery: Secondary | ICD-10-CM

## 2017-04-25 DIAGNOSIS — R0609 Other forms of dyspnea: Secondary | ICD-10-CM | POA: Diagnosis not present

## 2017-04-25 DIAGNOSIS — I10 Essential (primary) hypertension: Secondary | ICD-10-CM

## 2017-04-25 NOTE — Progress Notes (Signed)
Cardiology Office Note    Date:  04/25/2017   ID:  [name], DOB [birth year], MRN [id number]  PCP:  Rogers Blocker, MD  Cardiologist: Sinclair Grooms, MD   Chief Complaint  Patient presents with  . Advice Only    Recurrent PE    History of Present Illness:  [name] is a 61 y.o. female is referred by Dr. Kevan Ny for evaluation and assessment of retracted dyspnea following saddle pulmonary embolism which occurred in June 2018.  [name] has a history of prior pulmonary embolism in 2012. She remained on Coumadin for 2 years after the episode. The etiology for pulmonary emboli at that time was not identified. She has been off anticoagulation therapy for greater than 3 years. In June she began experiencing jaw discomfort, lower extremity aching discomfort without swelling, chest discomfort, and finally dyspnea. This developed over 3-6 weeks. Upon presentation, with severe dyspnea on exertion she was found to have saddle pulmonary emboli. She was discharged from the hospital on Xarelto which she now takes 20 mg daily.  She has a history of mixed connective tissue disease. In 2012 she was told of "protein S deficiency". There is no family history of clotting or bleeding dyscrasias. She has never been found to have DVT on either instance of pulmonary emboli. 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  Other medical problems include hypertension, diabetes, chronic asthma, sinusitis,    Past Medical History:  Diagnosis Date  . Acute bronchitis   . Acute maxillary sinusitis   . Allergic rhinitis   . Dysmetabolic syndrome    History of  . Essential hypertension, malignant   . Extrinsic asthma, unspecified   . Fibroids   . H/O: GI bleed   . History of arteritis   . Ischemic colitis, enteritis, or enterocolitis ([place])    Hisotry of  . Obesity, unspecified   . Otitis media of both ears   . 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  Sinusitis   . Type II or unspecified type diabetes mellitus without mention of complication, not stated as uncontrolled    oral meds-insulin resistance  . Undifferentiated connective tissue disease ([place])    History of    Past Surgical History:  Procedure Laterality Date  . ABDOMINAL HYSTERECTOMY  2000  . BREAST EXCISIONAL BIOPSY Left 1978  . CARDIAC CATHETERIZATION  09/25/05  . COLONOSCOPY W/ BIOPSIES      Current Medications: Outpatient Medications Prior to Visit  Medication Sig Dispense Refill  . cholecalciferol (VITAMIN D) 1000 UNITS tablet Take 1,000 Units by mouth daily.    Marland Kitchen diltiazem (CARDIZEM CD) 360 MG 24 hr capsule Take 360 mg by mouth daily.    Marland Kitchen gabapentin (NEURONTIN) 300 MG capsule Take 300 mg by mouth at bedtime.     . metFORMIN (GLUCOPHAGE) 500 MG tablet Take 500 mg by mouth every evening.     . montelukast (SINGULAIR) 10 MG tablet Take 10 mg by mouth daily.  1  . XOPENEX HFA 45 MCG/ACT inhaler Inhale 1-2 puffs into the lungs every 6 (six) hours as needed for wheezing. Reported on 08/08/2015    . Biotin 1000 MCG tablet Take 1,000 mcg by mouth at bedtime.     . fluticasone (FLONASE) 50 MCG/ACT nasal spray Place 2 sprays into both nostrils daily. Reported on 08/08/2015 (Patient not taking: Reported on 04/25/2017) 16 g 11  . fluticasone (FLOVENT HFA) 44 MCG/ACT inhaler Inhale  2 puffs into the lungs 2 (two) times daily. (Patient taking differently: Inhale 2 puffs into the lungs 2 (two) times daily as needed (for breathing). ) 1 Inhaler 3  . Rivaroxaban 15 & 20 MG TBPK Take as directed on package: Start with one 15mg  tablet by mouth twice a day with food. On Day 22, switch to one 20mg  tablet once a day with food. (Patient not taking: Reported on 04/25/2017) 51 each 0   No facility-administered medications prior to visit.      Allergies:   Morphine and related   Social History   Social History  . Marital status: Widowed    Spouse name: N/A  . Number of children: N/A  .  Years of education: N/A   Social History Main Topics  . Smoking status: Never Smoker  . Smokeless tobacco: Never Used  . Alcohol use No  . Drug use: No  . Sexual activity: No   Other Topics Concern  . None   Social History Narrative  . None     Family History:  The patient's family history includes Congestive Heart Failure in her mother; Hypertension in her father; Kidney disease in her father and mother.   ROS:   Please see the history of present illness.    History of mixed connective tissue disease. This is poorly characterized without much information available from the patient or her records. Bilateral leg pain, recurring left jaw pain, orthopnea, dyspnea on exertion, wheezing, muscle pain, headaches (occipital migraine). Was on a diuretic before the most recent pulmonary embolus. HCTZ was discontinued during that hospital stay because of low blood pressures. She is concerned about previous exposure to diethylstilbestrol in utero and has read that did is associated with potential for clotting.  All other systems reviewed and are negative.   PHYSICAL EXAM:   VS:  BP (!) 150/92 (BP Location: Left Arm)   Pulse (!) 102   Ht 5\' 3"  (1.6 m)   Wt 171 lb 6.4 oz (77.7 kg)   BMI 30.36 kg/m    GEN: Well nourished, well developed, and appears to be in some degree of respiratory distress with audible breathing HEENT: normal  Neck: no JVD, carotid bruits, or masses Cardiac: Tachycardic with RRR; no murmurs, rubs, or gallops,no edema . The S2 is fixed. The pulmonic component is increased. Respiratory:  clear to auscultation bilaterally, normal work of breathing GI: soft, nontender, nondistended, + BS MS: no deformity or atrophy  Skin: warm and dry, no rash Neuro:  Alert and Oriented x 3, Strength and sensation are intact Psych: euthymic mood, full affect  Wt Readings from Last 3 Encounters:  04/25/17 171 lb 6.4 oz (77.7 kg)  02/01/17 164 lb 6.4 oz (74.6 kg)  01/03/17 168 lb (76.2  kg)      Studies/Labs Reviewed:   EKG:  EKG  With incomplete right bundle branch block is noted on the EKG performed today. There is additionally evidence of biatrial enlargement. QRS axis is normal however at 58. The incomplete right bundle is new compared to the admitting tracing in June 2018.  Recent Labs: 01/27/2017: B Natriuretic Peptide 156.2 01/29/2017: BUN 9; Creatinine, Ser 1.04; Potassium 4.1; Sodium 140 01/31/2017: Hemoglobin 14.5; Platelets 305   Lipid Panel    Component Value Date/Time   CHOL 153 01/27/2017 0856   TRIG 86 01/27/2017 0856   HDL 60 01/27/2017 0856   CHOLHDL 2.6 01/27/2017 0856   VLDL 17 01/27/2017 0856   LDLCALC 76 01/27/2017 0856  Additional studies/ records that were reviewed today include:  Chest CT angio June 2018:  IMPRESSION: Bilateral pulmonary emboli at the confluence of the bilateral lobar pulmonary arterial branches, extending to all of the lobar and segmental branches.  Large clot burden with critically increased RV/LV ratio of 1.6, suggestive of right heart strain.   ASSESSMENT:    1. DOE (dyspnea on exertion)   2. Cor pulmonale (chronic) (HCC)   3. 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 Essential hypertension   5. Type 2 diabetes mellitus without complication, with long-term current use of insulin ([place])   6. Mixed connective tissue disease ([place])   7. Protein S deficiency ([place])      PLAN:  In order of problems listed above:  1. Persistent dyspnea in the setting of recurrent pulmonary emboli, and most recently with "saddle embolus" raises the question of incomplete clearing of pulmonary clot. We will repeat a 2-D Doppler echocardiogram to assess right heart size and function and hopefully quantitate pulmonary artery pressures. We need to exclude the possibility of chronic thromboembolic pulmonary hypertension (CTEPH). We will therefore also perform a VQ scan to look for evidence of this entity. Rule out the possibility of  acute on chronic diastolic heart failure contributing to the dyspnea. This could be exacerbated by the fact that chronic thiazide diuretic therapy was discontinued in June when she had the saddle embolus and was hypotensive. 2. Possibly related to recurrent pulmonary emboli. VQ scan to rule out CTEPH. 3. This will be excluded with the workup as outlined above. She will need to be on lifelong anticoagulation therapy. 4. Needs better management. Perhaps some of her dyspnea is related to diastolic heart dysfunction and can be made better by resumption of low-dose diuretic. We will make a final decision about this after the echo result is known. 5. Not addressed 6. Not sure if this has a role in the patient's recurrent PE. Perhaps chronic systemic inflammation is a predisposing factor for venous clotting. 7. Not sure how this diagnosis was made. The patient states that this is one of the diagnoses became from her first episode of pulmonary embolism. Not a we plan to continue anticoagulation indefinitely, it probably doesn't matter if the diagnosis exists are not other than from a hereditary standpoint and impacted it could have on her daughter may need to get hematology involved.  Complicated situation. We must first exclude CTEPH. If not present then it is possible diastolic dysfunction has been aggravated by removal of diuretic therapy. We'll consider reinstituting diuretic therapy if blood pressure remains high and especially if there is no evidence of thromboembolic ulnar hypertension. Clinical follow-up in one month.  Medication Adjustments/Labs and Tests Ordered: Current medicines are reviewed at length with the patient today.  Concerns regarding medicines are outlined above.  Medication changes, Labs and Tests ordered today are listed in the Patient Instructions below. Patient Instructions  Medication Instructions:  None  Labwork: None  Testing/Procedures: Your physician has requested that you  have an echocardiogram. Echocardiography is a painless test that uses sound waves to create images of your heart. It provides your doctor with information about the size and shape of your heart and how well your heart's chambers and valves are working. This procedure takes approximately one hour. There are no restrictions for this procedure.  Your physician would like for you to have a VQ scan completed.    Follow-Up: Your physician recommends that you schedule a follow-up appointment in: 1 month with Dr. Tamala Julian. (  Can have 10/24 at 10AM)   Any Other Special Instructions Will Be Listed Below (If Applicable).     If you need a refill on your cardiac medications before your next appointment, please call your pharmacy.      Signed, Sinclair Grooms, MD  04/25/2017 3:55 PM    [address], [place], [place]  [address] Phone: [phone]; Fax: [phone]

## 2017-04-25 NOTE — Patient Instructions (Addendum)
Medication Instructions:  None  Labwork: None  Testing/Procedures: Your physician has requested that you have an echocardiogram. Echocardiography is a painless test that uses sound waves to create images of your heart. It provides your doctor with information about the size and shape of your heart and how well your heart's chambers and valves are working. This procedure takes approximately one hour. There are no restrictions for this procedure.  Your physician would like for you to have a VQ scan completed.    Follow-Up: Your physician recommends that you schedule a follow-up appointment in: 1 month with Dr. Tamala Julian. (Can have 10/24 at 10AM)   Any Other Special Instructions Will Be Listed Below (If Applicable).     If you need a refill on your cardiac medications before your next appointment, please call your pharmacy.

## 2017-04-29 ENCOUNTER — Encounter (HOSPITAL_COMMUNITY)
Admission: RE | Admit: 2017-04-29 | Discharge: 2017-04-29 | Disposition: A | Discharge status: Home / Self Care | Payer: BC Managed Care – PPO | Source: Ambulatory Visit | Attending: Interventional Cardiology | Admitting: Interventional Cardiology

## 2017-04-29 ENCOUNTER — Ambulatory Visit (HOSPITAL_COMMUNITY)
Admission: RE | Admit: 2017-04-29 | Discharge: 2017-04-29 | Disposition: A | Discharge status: Home / Self Care | Payer: BC Managed Care – PPO | Source: Ambulatory Visit | Attending: Interventional Cardiology | Admitting: Interventional Cardiology

## 2017-04-29 DIAGNOSIS — I749 Embolism and thrombosis of unspecified artery: Secondary | ICD-10-CM

## 2017-04-29 DIAGNOSIS — I2781 Cor pulmonale (chronic): Secondary | ICD-10-CM | POA: Insufficient documentation

## 2017-04-29 MED ORDER — TECHNETIUM TO 99M ALBUMIN AGGREGATED
4.2600 | Freq: Once | INTRAVENOUS | Status: AC | PRN
  Administered 2017-04-29: 4.26 via INTRAVENOUS

## 2017-04-29 MED ORDER — TECHNETIUM TC 99M DIETHYLENETRIAME-PENTAACETIC ACID
32.5000 | Freq: Once | INTRAVENOUS | Status: AC | PRN
  Administered 2017-04-29: 32.5 via RESPIRATORY_TRACT

## 2017-05-01 ENCOUNTER — Ambulatory Visit (HOSPITAL_COMMUNITY): Payer: BC Managed Care – PPO | Attending: Cardiovascular Disease

## 2017-05-01 ENCOUNTER — Other Ambulatory Visit: Payer: Self-pay

## 2017-05-01 DIAGNOSIS — J40 Bronchitis, not specified as acute or chronic: Secondary | ICD-10-CM | POA: Insufficient documentation

## 2017-05-01 DIAGNOSIS — Z86711 Personal history of pulmonary embolism: Secondary | ICD-10-CM | POA: Diagnosis not present

## 2017-05-01 DIAGNOSIS — I2781 Cor pulmonale (chronic): Secondary | ICD-10-CM | POA: Diagnosis not present

## 2017-05-01 DIAGNOSIS — I119 Hypertensive heart disease without heart failure: Secondary | ICD-10-CM | POA: Diagnosis not present

## 2017-05-01 DIAGNOSIS — E119 Type 2 diabetes mellitus without complications: Secondary | ICD-10-CM | POA: Insufficient documentation

## 2017-05-01 DIAGNOSIS — I340 Nonrheumatic mitral (valve) insufficiency: Secondary | ICD-10-CM | POA: Insufficient documentation

## 2017-05-02 ENCOUNTER — Telehealth: Payer: Self-pay | Admitting: Interventional Cardiology

## 2017-05-02 DIAGNOSIS — R0602 Shortness of breath: Secondary | ICD-10-CM

## 2017-05-02 MED ORDER — HYDROCHLOROTHIAZIDE 12.5 MG PO CAPS: 12.5000 mg | ORAL_CAPSULE | Freq: Every day | ORAL | 3 refills | Status: DC

## 2017-05-02 NOTE — Telephone Encounter (Signed)
Follow Up:    Returning your call from this morning,concerning her Echo results.

## 2017-05-02 NOTE — Telephone Encounter (Signed)
Within next 2-3 weeks and can try quarter day.

## 2017-05-02 NOTE — Telephone Encounter (Signed)
Spoke with pt and moved appt to 10/3.  Pt verbalized understanding and was appreciative for call.

## 2017-05-02 NOTE — Telephone Encounter (Signed)
Notes recorded by Belva Crome, MD on 05/01/2017 at 7:03 PM EDT Let the patient know the echocardiogram looks great. Heart size has returned to normal. Heart function is normal. This information in conjunction with the ventilation/perfusion scan rules out any significant chronic thromboembolic pulmonary hypertension problem. I'm not sure why dyspnea continues to be a problem. Plan to start hydrochlorothiazide 12.5 mg per day. She needs clinical follow-up with me. I go be to eventually discontinue diltiazem. Basic metabolic panel and 1-2 weeks. A BNP should be obtained at that time as well.  Spoke with pt and went over echo results and recommendations per Dr. Tamala Julian.  Pt verbalized understanding and was in agreement with this plan.  Pt will have labs on 05/10/17.  Pt currently scheduled to see Dr. Tamala Julian 10/24.  Will route to Dr. Tamala Julian to see if pt needs to come in sooner?

## 2017-05-10 ENCOUNTER — Other Ambulatory Visit: Payer: BC Managed Care – PPO | Admitting: *Deleted

## 2017-05-10 DIAGNOSIS — R0602 Shortness of breath: Secondary | ICD-10-CM

## 2017-05-10 LAB — PRO B NATRIURETIC PEPTIDE: NT-PRO BNP: 10 pg/mL (ref 0–287)

## 2017-05-10 LAB — BASIC METABOLIC PANEL
BUN/Creatinine Ratio: 15 (ref 9–23)
BUN: 17 mg/dL (ref 6–24)
CALCIUM: 9.5 mg/dL (ref 8.7–10.2)
CHLORIDE: 101 mmol/L (ref 96–106)
CO2: 24 mmol/L (ref 20–29)
Creatinine, Ser: 1.17 mg/dL — ABNORMAL HIGH (ref 0.57–1.00)
GFR calc non Af Amer: 51 mL/min/{1.73_m2} — ABNORMAL LOW (ref >59)
GFR, EST AFRICAN AMERICAN: 59 mL/min/{1.73_m2} — AB (ref >59)
Glucose: 93 mg/dL (ref 65–99)
Potassium: 4.1 mmol/L (ref 3.5–5.2)
Sodium: 140 mmol/L (ref 134–144)

## 2017-05-13 ENCOUNTER — Telehealth: Payer: Self-pay | Admitting: Interventional Cardiology

## 2017-05-13 NOTE — Telephone Encounter (Signed)
Informed pt of lab results. Pt verbalized understanding. 

## 2017-05-13 NOTE — Telephone Encounter (Signed)
New Message ° ° pt verbalized that she is returning call for rn °

## 2017-05-14 NOTE — Progress Notes (Signed)
Cardiology Office Note    Date:  05/15/2017   ID:  [name], DOB 1956/06/04, MRN [id number]  PCP:  Rogers Blocker, MD  Cardiologist: Sinclair Grooms, MD   Chief Complaint  Patient presents with  . Shortness of Breath    History of recurrent PE    History of Present Illness:  [name] is a 61 y.o. female  Recurrent PE x 2 most recently saddle embolus June 2018, essential hypertension with LVH, connective tissue disease (question specific diagnosis), and chronic dyspnea.  She was referred for evaluation of dyspnea. She is status post PE on 2 separate occasions most recently in June of this shear. Since then she has had chronic dyspnea on exertion. We did workup to exclude the possibility of chronic thromboembolic pulmonary hypertension (CTEPH), which was negative. Perfusion scan did not reveal evidence of chronic embolization and echocardiogram revealed normal right heart size and no evidence of tricuspid regurgitation or significant pulmonary hypertension. Surprisingly, we did identify moderate left ventricular hypertrophy. HCTZ 12.5 mg was added. Breathing is perhaps slightly better. She is still concerned that she has congestion and relatives/friends complaining that she breathes heavily with physical activity.  Past Medical History:  Diagnosis Date  . Acute bronchitis   . Acute maxillary sinusitis   . Allergic rhinitis   . Dysmetabolic syndrome    History of  . Essential hypertension, malignant   . Extrinsic asthma, unspecified   . Fibroids   . H/O: GI bleed   . History of arteritis   . Ischemic colitis, enteritis, or enterocolitis ([place])    Hisotry of  . Obesity, unspecified   . Otitis media of both ears   . Recurrent pulmonary emboli ([place]) 2012 and 2018   Saddle embolism 2018  . Sinusitis   . Type II or unspecified type diabetes mellitus without mention of complication, not stated as uncontrolled    oral meds-insulin resistance  . Undifferentiated connective  tissue disease ([place])    History of    Past Surgical History:  Procedure Laterality Date  . ABDOMINAL HYSTERECTOMY  2000  . BREAST EXCISIONAL BIOPSY Left 1978  . CARDIAC CATHETERIZATION  09/25/05  . COLONOSCOPY W/ BIOPSIES      Current Medications: Outpatient Medications Prior to Visit  Medication Sig Dispense Refill  . ASMANEX HFA 100 MCG/ACT AERO Inhale 2 puffs into the lungs 2 (two) times daily.  3  . BIOTIN 5000 PO Take 5,000 mg by mouth at bedtime.    . cholecalciferol (VITAMIN D) 1000 UNITS tablet Take 1,000 Units by mouth daily.    Marland Kitchen diltiazem (CARDIZEM CD) 360 MG 24 hr capsule Take 360 mg by mouth daily.    . fluticasone (FLONASE) 50 MCG/ACT nasal spray Place 2 sprays into both nostrils daily as needed for allergies or rhinitis.    . fluticasone (FLOVENT HFA) 44 MCG/ACT inhaler Inhale 2 puffs into the lungs 2 (two) times daily as needed (allergies).    . gabapentin (NEURONTIN) 300 MG capsule Take 300 mg by mouth at bedtime.     . hydrochlorothiazide (MICROZIDE) 12.5 MG capsule Take 1 capsule (12.5 mg total) by mouth daily. 90 capsule 3  . metFORMIN (GLUCOPHAGE) 500 MG tablet Take 500 mg by mouth every evening.     . montelukast (SINGULAIR) 10 MG tablet Take 10 mg by mouth daily.  1  . XARELTO 20 MG TABS tablet Take 20 mg by mouth daily.  3  . XOPENEX HFA 45 MCG/ACT inhaler  Inhale 1-2 puffs into the lungs every 6 (six) hours as needed for wheezing. Reported on 08/08/2015    . losartan (COZAAR) 50 MG tablet Take 50 mg by mouth daily.  3   No facility-administered medications prior to visit.      Allergies:   Morphine and related   Social History   Social History  . Marital status: Widowed    Spouse name: N/A  . Number of children: N/A  . Years of education: N/A   Social History Main Topics  . Smoking status: Never Smoker  . Smokeless tobacco: Never Used  . Alcohol use No  . Drug use: No  . Sexual activity: No   Other Topics Concern  . None   Social History  Narrative  . None     Family History:  The patient's family history includes Congestive Heart Failure in her mother; Hypertension in her father; Kidney disease in her father and mother.   ROS:   Please see the history of present illness.    Nasal congestion, raspy voice, coughing, no wheezing, and anxiety concerning why she has C health issues that are currently present.  All other systems reviewed and are negative.   PHYSICAL EXAM:   VS:  BP 140/88 (BP Location: Left Arm)   Pulse 100   Ht 5\' 3"  (1.6 m)   Wt 170 lb 12.8 oz (77.5 kg)   BMI 30.26 kg/m    GEN: Well nourished, well developed, in no acute distress  HEENT: normal  Neck: no JVD, carotid bruits, or masses Cardiac: RRR; no murmurs, rubs, or gallops,no edema  Respiratory:  clear to auscultation bilaterally, normal work of breathing GI: soft, nontender, nondistended, + BS MS: no deformity or atrophy  Skin: warm and dry, no rash Neuro:  Alert and Oriented x 3, Strength and sensation are intact Psych: euthymic mood, full affect  Wt Readings from Last 3 Encounters:  05/15/17 170 lb 12.8 oz (77.5 kg)  04/25/17 171 lb 6.4 oz (77.7 kg)  02/01/17 164 lb 6.4 oz (74.6 kg)      Studies/Labs Reviewed:   EKG:  EKG  The most recent EKG performed 04/25/17 demonstrates biatrial abnormality, incomplete right bundle, prominent left ventricular voltage.  Recent Labs: 01/27/2017: B Natriuretic Peptide 156.2 01/31/2017: Hemoglobin 14.5; Platelets 305 05/10/2017: BUN 17; Creatinine, Ser 1.17; NT-Pro BNP 10; Potassium 4.1; Sodium 140   Lipid Panel    Component Value Date/Time   CHOL 153 01/27/2017 0856   TRIG 86 01/27/2017 0856   HDL 60 01/27/2017 0856   CHOLHDL 2.6 01/27/2017 0856   VLDL 17 01/27/2017 0856   LDLCALC 76 01/27/2017 0856    Additional studies/ records that were reviewed today include:   2-D Doppler echocardiogram 04/21/2017: ------------------------------------------------------------------- Study Conclusions     - Left ventricle: The cavity size was normal. Wall thickness was   increased in a pattern of moderate LVH. Systolic function was   normal. The estimated ejection fraction was in the range of 60%   to 65%. Wall motion was normal; there were no regional wall   motion abnormalities. Doppler parameters are consistent with   abnormal left ventricular relaxation (grade 1 diastolic   dysfunction). - Mitral valve: There was mild regurgitation. - Atrial septum: No defect or patent foramen ovale was identified.  Ventilation/perfusion lung scan 04/29/2017: TECHNIQUE: Ventilation images were obtained in multiple projections using inhaled aerosol Tc-63m DTPA. Perfusion images were obtained in multiple projections after intravenous injection of Tc-96m MAA.   RADIOPHARMACEUTICALS:  32.5 mCi Technetium-65m DTPA aerosol inhalation and 4.26 mCi Technetium-88m MAA IV   COMPARISON:  None.   FINDINGS: Ventilation: No focal ventilation defect.   Perfusion: No wedge shaped peripheral perfusion defects to suggest acute pulmonary embolism.   IMPRESSION: No scintigraphic evidence of pulmonary embolus.    ASSESSMENT:    1. Dyspnea, unspecified type   2. Essential hypertension   3. Hypertensive left ventricular hypertrophy, without heart failure   4. Bilateral pulmonary embolism ([place])   5. Connective tissue disorder ([place])   6. Type 2 diabetes mellitus with complication, without long-term current use of insulin (HCC)   7. Hx of pulmonary embolus      PLAN:  In order of problems listed above:  1. Etiology is uncertain. Could be multifactorial including history of pulmonary embolism 2 and chronic diastolic heart failure. She most recently presented with saddle embolus. She had transient acute right heart strain but recent evaluation as noted above reveals resolution of right ventricular enlargement, no documented evidence of pulmonary hypertension, and the perfusion nuclear study did not reveal  "Swiss cheese" pulmonary pattern compatible with CTEPH. Surprisingly, echo demonstrated moderate left ventricular hypertrophy (measured wall thickness 1.25 cm). 2. Blood pressure needs better control. HCTZ was started after echo results were noted. She had previously been on Hyzaar 100/12.5 mg and therefore is still undermedicated compared to historical. We will increase back to the previous dose, and add metoprolol succinate 12.5 mg daily. We will subsequently further up titrate the beta blocker to get her heart rate into the 80s. Target blood pressure less than 130/85 mmHg consistently. 3. Mild to moderate hypertrophy with wall thickness of 12.5 cm. Further uptitrate antihypertensive therapy as noted above under #2. 4. Not addressed. No evidence for CTEPH. We will refer to hematology to determine if there is an underlying hypercoagulable state. I have recommended lifelong anticoagulation. 5. Poorly defined, and would be helpful to have specific diagnosis if there truly is an underlying autoimmune process. 6. Not addressed  Six-month follow-up on intensified antihypertensive regimen.  Medication Adjustments/Labs and Tests Ordered: Current medicines are reviewed at length with the patient today.  Concerns regarding medicines are outlined above.  Medication changes, Labs and Tests ordered today are listed in the Patient Instructions below. Patient Instructions  Medication Instructions:  1) INCREASE Losartan to 100mg  (2 tablets of your 50mg  tablets) once daily. When you are almost out of these and your Hydrochlorothiazide, 2) START Hyzaar 100/12.5mg  once daily 3) START Metoprolol Succinate 12.5mg  once daily  Labwork: Your physician recommends that you return for lab work in: 2 weeks (BMET)   Testing/Procedures: None  Follow-Up: Your physician wants you to follow-up in: 6 months with Dr. Tamala Julian. You will receive a reminder letter in the mail two months in advance. If you don't receive a letter,  please call our office to schedule the follow-up appointment.   Any Other Special Instructions Will Be Listed Below (If Applicable).  You have been referred to Hematology.    If you need a refill on your cardiac medications before your next appointment, please call your pharmacy.      Signed, Sinclair Grooms, MD  05/15/2017 12:59 PM    [facility] [address], [place], [place]  [address] Phone: [phone]; Fax: [phone]

## 2017-05-15 ENCOUNTER — Ambulatory Visit (INDEPENDENT_AMBULATORY_CARE_PROVIDER_SITE_OTHER): Payer: BC Managed Care – PPO | Admitting: Interventional Cardiology

## 2017-05-15 ENCOUNTER — Encounter: Payer: Self-pay | Admitting: Interventional Cardiology

## 2017-05-15 VITALS — BP 140/88 | HR 100 | Ht 63.0 in | Wt 170.8 lb

## 2017-05-15 DIAGNOSIS — I10 Essential (primary) hypertension: Secondary | ICD-10-CM

## 2017-05-15 DIAGNOSIS — Z86711 Personal history of pulmonary embolism: Secondary | ICD-10-CM | POA: Diagnosis not present

## 2017-05-15 DIAGNOSIS — M359 Systemic involvement of connective tissue, unspecified: Secondary | ICD-10-CM

## 2017-05-15 DIAGNOSIS — I119 Hypertensive heart disease without heart failure: Secondary | ICD-10-CM

## 2017-05-15 DIAGNOSIS — R0600 Dyspnea, unspecified: Secondary | ICD-10-CM | POA: Diagnosis not present

## 2017-05-15 DIAGNOSIS — E118 Type 2 diabetes mellitus with unspecified complications: Secondary | ICD-10-CM | POA: Diagnosis not present

## 2017-05-15 DIAGNOSIS — I2699 Other pulmonary embolism without acute cor pulmonale: Secondary | ICD-10-CM | POA: Diagnosis not present

## 2017-05-15 MED ORDER — LOSARTAN POTASSIUM-HCTZ 100-12.5 MG PO TABS: 1.0000 | ORAL_TABLET | Freq: Every day | ORAL | 3 refills | Status: DC

## 2017-05-15 MED ORDER — METOPROLOL SUCCINATE ER 25 MG PO TB24: 12.5000 mg | ORAL_TABLET | Freq: Every day | ORAL | 3 refills | Status: DC

## 2017-05-15 NOTE — Patient Instructions (Addendum)
Medication Instructions:  1) INCREASE Losartan to 100mg  (2 tablets of your 50mg  tablets) once daily. When you are almost out of these and your Hydrochlorothiazide, 2) START Hyzaar 100/12.5mg  once daily 3) START Metoprolol Succinate 12.5mg  once daily  Labwork: Your physician recommends that you return for lab work in: 2 weeks (BMET)   Testing/Procedures: None  Follow-Up: Your physician wants you to follow-up in: 6 months with Dr. Tamala Julian. You will receive a reminder letter in the mail two months in advance. If you don't receive a letter, please call our office to schedule the follow-up appointment.   Any Other Special Instructions Will Be Listed Below (If Applicable).  You have been referred to Hematology.    If you need a refill on your cardiac medications before your next appointment, please call your pharmacy.

## 2017-05-21 ENCOUNTER — Encounter: Payer: Self-pay | Admitting: Hematology and Oncology

## 2017-05-29 ENCOUNTER — Other Ambulatory Visit: Payer: BC Managed Care – PPO | Admitting: *Deleted

## 2017-05-29 DIAGNOSIS — I10 Essential (primary) hypertension: Secondary | ICD-10-CM

## 2017-05-29 LAB — BASIC METABOLIC PANEL
BUN / CREAT RATIO: 12 (ref 9–23)
BUN: 13 mg/dL (ref 6–24)
CO2: 25 mmol/L (ref 20–29)
Calcium: 9.5 mg/dL (ref 8.7–10.2)
Chloride: 99 mmol/L (ref 96–106)
Creatinine, Ser: 1.1 mg/dL — ABNORMAL HIGH (ref 0.57–1.00)
GFR calc Af Amer: 64 mL/min/{1.73_m2} (ref >59)
GFR calc non Af Amer: 55 mL/min/{1.73_m2} — ABNORMAL LOW (ref >59)
GLUCOSE: 93 mg/dL (ref 65–99)
POTASSIUM: 4.5 mmol/L (ref 3.5–5.2)
SODIUM: 141 mmol/L (ref 134–144)

## 2017-05-30 ENCOUNTER — Ambulatory Visit (HOSPITAL_BASED_OUTPATIENT_CLINIC_OR_DEPARTMENT_OTHER): Payer: BC Managed Care – PPO | Admitting: Hematology and Oncology

## 2017-05-30 ENCOUNTER — Encounter: Payer: Self-pay | Admitting: Hematology and Oncology

## 2017-05-30 ENCOUNTER — Telehealth: Payer: Self-pay | Admitting: Hematology and Oncology

## 2017-05-30 ENCOUNTER — Ambulatory Visit: Payer: BC Managed Care – PPO

## 2017-05-30 DIAGNOSIS — R0789 Other chest pain: Secondary | ICD-10-CM

## 2017-05-30 DIAGNOSIS — Z7901 Long term (current) use of anticoagulants: Secondary | ICD-10-CM

## 2017-05-30 DIAGNOSIS — R490 Dysphonia: Secondary | ICD-10-CM

## 2017-05-30 DIAGNOSIS — I2699 Other pulmonary embolism without acute cor pulmonale: Secondary | ICD-10-CM | POA: Diagnosis not present

## 2017-05-30 DIAGNOSIS — R05 Cough: Secondary | ICD-10-CM

## 2017-05-30 DIAGNOSIS — R51 Headache: Secondary | ICD-10-CM

## 2017-05-30 DIAGNOSIS — R6884 Jaw pain: Secondary | ICD-10-CM

## 2017-05-30 DIAGNOSIS — J029 Acute pharyngitis, unspecified: Secondary | ICD-10-CM

## 2017-05-30 DIAGNOSIS — R1084 Generalized abdominal pain: Secondary | ICD-10-CM

## 2017-05-30 DIAGNOSIS — R519 Headache, unspecified: Secondary | ICD-10-CM

## 2017-05-30 LAB — COMPREHENSIVE METABOLIC PANEL
ALBUMIN: 4.4 g/dL (ref 3.5–5.0)
ALT: 15 U/L (ref 0–55)
ANION GAP: 8 meq/L (ref 3–11)
AST: 17 U/L (ref 5–34)
Alkaline Phosphatase: 90 U/L (ref 40–150)
BUN: 10.9 mg/dL (ref 7.0–26.0)
CO2: 27 meq/L (ref 22–29)
Calcium: 9.7 mg/dL (ref 8.4–10.4)
Chloride: 105 mEq/L (ref 98–109)
Creatinine: 1.1 mg/dL (ref 0.6–1.1)
Glucose: 91 mg/dl (ref 70–140)
Potassium: 4.1 mEq/L (ref 3.5–5.1)
Sodium: 140 mEq/L (ref 136–145)
TOTAL PROTEIN: 8.4 g/dL — AB (ref 6.4–8.3)
Total Bilirubin: 0.49 mg/dL (ref 0.20–1.20)

## 2017-05-30 LAB — CBC WITH DIFFERENTIAL/PLATELET
BASO%: 0.6 % (ref 0.0–2.0)
Basophils Absolute: 0 10*3/uL (ref 0.0–0.1)
EOS ABS: 0.3 10*3/uL (ref 0.0–0.5)
EOS%: 6.2 % (ref 0.0–7.0)
HEMATOCRIT: 41.2 % (ref 34.8–46.6)
HEMOGLOBIN: 13.7 g/dL (ref 11.6–15.9)
LYMPH#: 2 10*3/uL (ref 0.9–3.3)
LYMPH%: 42.1 % (ref 14.0–49.7)
MCH: 30.4 pg (ref 25.1–34.0)
MCHC: 33.3 g/dL (ref 31.5–36.0)
MCV: 91.4 fL (ref 79.5–101.0)
MONO#: 0.3 10*3/uL (ref 0.1–0.9)
MONO%: 6.6 % (ref 0.0–14.0)
NEUT%: 44.5 % (ref 38.4–76.8)
NEUTROS ABS: 2.1 10*3/uL (ref 1.5–6.5)
PLATELETS: 359 10*3/uL (ref 145–400)
RBC: 4.51 10*6/uL (ref 3.70–5.45)
RDW: 13.4 % (ref 11.2–14.5)
WBC: 4.7 10*3/uL (ref 3.9–10.3)

## 2017-05-30 NOTE — Telephone Encounter (Signed)
Scheduled appt per 10/18 los - Gave patient AVS and calender per los. - central radiology to schedule ct.

## 2017-05-31 LAB — HOMOCYSTEINE: Homocysteine: 11.8 umol/L (ref 0.0–15.0)

## 2017-06-01 LAB — PROTEIN S, ANTIGEN, FREE: Protein S, Free: 86 % (ref 57–157)

## 2017-06-01 LAB — LUPUS ANTICOAGULANT PANEL
DRVVT CONFIRM: 1.4 ratio — AB (ref 0.8–1.2)
DRVVT: 109.5 s — AB (ref 0.0–47.0)
PTT-LA: 39.7 s (ref 0.0–51.9)
dRVVT Mix: 86.9 s — ABNORMAL HIGH (ref 0.0–47.0)

## 2017-06-01 LAB — FACTOR 2 ASSAY: Factor II Activity: 126 % (ref 50–154)

## 2017-06-01 LAB — PROTEIN C ACTIVITY: Protein C-Functional: 155 % (ref 73–180)

## 2017-06-01 LAB — ANTITHROMBIN III: ANTITHROMBIN ACTIVITY: 145 % — AB (ref 75–135)

## 2017-06-01 LAB — PROTEIN S ACTIVITY: Protein S-Functional: 142 % — ABNORMAL HIGH (ref 63–140)

## 2017-06-01 LAB — PROTEIN S, TOTAL: PROTEIN S AG TOTAL: 112 % (ref 60–150)

## 2017-06-02 LAB — PROTEIN C, TOTAL: Protein C Antigen: 105 % (ref 60–150)

## 2017-06-02 LAB — CARDIOLIPIN ANTIBODIES, IGG, IGM, IGA
Anticardiolipin Ab,IgG,Qn: <9 GPL U/mL (ref 0–14)
Anticardiolipin Ab,IgM,Qn: <9 MPL U/mL (ref 0–12)

## 2017-06-03 LAB — BETA-2-GLYCOPROTEIN I ABS, IGG/M/A
Beta-2 Glyco 1 IgM: <9 GPI IgM units (ref 0–32)
Beta-2 Glycoprotein I Ab, IgG: <9 GPI IgG units (ref 0–20)

## 2017-06-04 LAB — PNH PROFILE (-HIGH SENSITIVITY): Viability:: 99

## 2017-06-04 LAB — PROTHROMBIN GENE MUTATION

## 2017-06-05 ENCOUNTER — Ambulatory Visit: Payer: BC Managed Care – PPO | Admitting: Interventional Cardiology

## 2017-06-06 ENCOUNTER — Encounter: Payer: Self-pay | Admitting: Hematology and Oncology

## 2017-06-06 NOTE — Assessment & Plan Note (Signed)
61 y.o. 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 Initial thrombotic event was extensively evaluated including a thrombophilia panel, which unfortunately did not include a prothrombin gene mutation.  At this time, due to recurrent VTE, I will obtain a repeat thrombophilia panel including prothrombin gene mutation status and PNH evaluation.  Patient has an interesting constellation of symptoms with loss of taste, and voice change that may hint at another pathological process occurring in the cervical or cerebral areas. With that in mind, we'll start evaluation with CT of the brain.  Plan: --Recommend indefinite anticoagulation, Rivaroxaban (Xarelto) appears to be adequate for the task at this time --Patient is due for colonoscopy which needs to be obtained a time now. Patient will need to be bridged with Lovenox for the procedure --Return to clinic in 1 week to review the findings.

## 2017-06-06 NOTE — Progress Notes (Signed)
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 Initial thrombotic event was extensively evaluated including a thrombophilia panel, which unfortunately did not include a prothrombin gene mutation.  At this time, due to recurrent VTE, I will obtain a repeat thrombophilia panel including prothrombin gene mutation status and PNH evaluation.  Patient has an interesting constellation of symptoms with loss of taste, and voice change that may hint at another pathological process occurring in the cervical or cerebral areas. With that in mind, we'll start evaluation with CT of the brain.  Plan: --Recommend indefinite anticoagulation, Rivaroxaban (Xarelto) appears to be adequate for the task at this time --Patient is due for colonoscopy which needs to be obtained a time now. Patient will need to be bridged with Lovenox for the procedure --Return to clinic in 1 week to review the findings.  Voice recognition software was used and creation of this note. Despite my best effort at editing the text, some misspelling/errors may have occurred.  Orders Placed This Encounter  Procedures  . CT Soft Tissue Neck W Contrast    Standing Status:   Future    Standing Expiration Date:   05/30/2018    Order Specific Question:   If indicated for the ordered procedure, I authorize the administration of contrast media per Radiology protocol    Answer:   Yes    Order Specific Question:   Is patient pregnant?    Answer:   No    Order Specific Question:   Preferred imaging location?    Answer:   [facility]    Order Specific Question:   Radiology Contrast Protocol - do NOT remove file path    Answer:   \\[email or web address]    Order Specific Question:   Reason for Exam additional comments    Answer:   Voice change, please eval for  evidence of neck mass  . CT Abdomen Pelvis W Contrast    Standing Status:   Future    Standing Expiration Date:   05/30/2018    Order Specific Question:   If indicated for the ordered procedure, I authorize the administration of contrast media per Radiology protocol    Answer:   Yes    Order Specific Question:   Is patient pregnant?    Answer:   No    Order Specific Question:   Preferred imaging location?    Answer:   [facility]    Order Specific Question:   Radiology Contrast Protocol - do NOT remove file path    Answer:   \\[email or web address]    Order Specific Question:   Reason for Exam additional comments    Answer:   Recurrent clots, please eval IVC clot/pelvic vein clot presence  . CT Head W Wo Contrast    Standing Status:   Future    Standing Expiration Date:   05/30/2018    Order Specific Question:   If indicated for the ordered procedure, I authorize the administration of contrast media per Radiology protocol    Answer:   Yes    Order Specific Question:   Is patient pregnant?    Answer:   No    Order Specific Question:   Preferred imaging location?    Answer:   [facility]    Order Specific Question:   Radiology Contrast Protocol - do NOT remove file path  Answer:   \\[email or web address]    Order Specific Question:   Reason for Exam additional comments    Answer:   Intermittent occipital and Lt TMJ pain -- please eval for possible etiologies  . CBC with Differential    Standing Status:   Future    Number of Occurrences:   1    Standing Expiration Date:   05/30/2018  . Comprehensive metabolic panel    Standing Status:   Future    Number of Occurrences:   1    Standing Expiration Date:   05/30/2018  . PNH Profile (High Sensitivity)    Standing Status:   Future    Number of Occurrences:   1    Standing Expiration Date:   05/30/2018  . Antithrombin III*    Standing Status:   Future    Number of  Occurrences:   1    Standing Expiration Date:   05/30/2018  . Beta-2-glycoprotein i abs, IgG/M/A    Standing Status:   Future    Number of Occurrences:   1    Standing Expiration Date:   05/30/2018  . Cardiolipin antibodies, IgG, IgM, IgA*    Standing Status:   Future    Number of Occurrences:   1    Standing Expiration Date:   05/30/2018  . Homocysteine, serum    Standing Status:   Future    Number of Occurrences:   1    Standing Expiration Date:   05/30/2018  . Lupus anticoagulant panel*    Standing Status:   Future    Number of Occurrences:   1    Standing Expiration Date:   05/30/2018  . Protein S, total and free    Standing Status:   Future    Standing Expiration Date:   05/30/2018  . Protein S activity*    Standing Status:   Future    Number of Occurrences:   1    Standing Expiration Date:   05/30/2018  . Protein C, total*    Standing Status:   Future    Number of Occurrences:   1    Standing Expiration Date:   05/30/2018  . Protein C activity*    Standing Status:   Future    Number of Occurrences:   1    Standing Expiration Date:   05/30/2018  . Factor 2 assay    Standing Status:   Future    Number of Occurrences:   1    Standing Expiration Date:   05/30/2018  . Prothrombin Gene Mutation  . Protein S, total    Standing Status:   Future    Number of Occurrences:   1    Standing Expiration Date:   05/30/2018  . Protein S, Antigen, Free*    Standing Status:   Future    Number of Occurrences:   1    Standing Expiration Date:   05/30/2018    All questions were answered.  . The patient knows to call the clinic with any problems, questions or concerns.  This note was electronically signed.    History of Presenting Illness [name] 61 y.o. presenting to the [facility] for evaluation and management recommendations regarding her current unprovoked venous thromboembolic phenomena.   At the present time, patient is taking her prescribed her Rivaroxaban  (Xarelto). She continues to have chronic dyspnea on exertion with previous evaluations negative for pulmonary hypertension, chronic thromboembolism. She continues to have dry cough, wheezing, and pleuritic  chest pain. She also complains of hoarse voice. Interestingly, she has lost sensation of taste following her last pulmonary embolism. She also complains of left mandibular pain with previous history of TMJ surgery with wire placement..  Oncological/hematological History: --Event #1, 06/21/11: Presented with 4 weeks of difficult time breathing associated with sharp pleuritic pain in her midsternal area that radiates "straight through to my back, between my shoulder blades."  Pain was 7/10, and improved down to 5/10 with ASA. Pain was exacerbated when lying down and relieved when sitting up. At the onset of the SOB, patient had an Urgent Care visit with a slight fever, entire body chills. Subsequently, she started becoming more easily fatigued, frustrated in addition to having intermittent insomnia due to her breathing pain. There was no preceding period of immobilization, trauma, or surgery.  --CTA Chest, 06/21/11: Multiple bilateral segmental pulmonary emboli.  --Doppler US BL LE, 06/22/11: No evidence of deep vein thrombosis.  --Thrombophilia Panel, 2012: Negative for antiphospholipid antibody syndrome, normal values for protein C, protein S, and antithrombin III. Negative for factor V Leiden mutation  --Treatment: Warfarin x3 yrs  --CTA Chest, 12/10/11: No evidence of pulmonary emboli or mediastinal/hilar lymphadenopathy.  --Event #2, 01/25/17: Presented to the ED for evaluation of 2-week shortness of breath with exertion and pleuritic chest pain.  No light-headedness or dizziness.  No near syncope.   once again, no distinct provoking factors identified such as immobility, trauma, or surgery.  --CTA Chest, 01/25/17: Bilateral lobar branches pulmonary emboli with significant clot burden and evidence  of right ventricular strain.  --Doppler US BL LE, 01/26/17: No evidence of deep vein thrombosis.  --Treatment: Rivaroxaban  Medical History: Past Medical History:  Diagnosis Date  . Acute bronchitis   . Acute maxillary sinusitis   . Allergic rhinitis   . Dysmetabolic syndrome    History of  . Essential hypertension, malignant   . Extrinsic asthma, unspecified   . Fibroids   . H/O: GI bleed   . History of arteritis   . 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 Obesity, unspecified   . Otitis media of both ears   . Recurrent pulmonary emboli ([place]) 2012 and 2018   Saddle embolism 2018  . Sinusitis   . Type II or unspecified type diabetes mellitus without mention of complication, not stated as uncontrolled    oral meds-insulin resistance  . Undifferentiated connective tissue disease ([place])    History of    Surgical History: Past Surgical History:  Procedure Laterality Date  . ABDOMINAL HYSTERECTOMY  2000  . BREAST EXCISIONAL BIOPSY Left 1978  . CARDIAC CATHETERIZATION  09/25/05  . COLONOSCOPY W/ BIOPSIES      Family History: Family History  Problem Relation Age of Onset  . Kidney disease Mother   . Congestive Heart Failure Mother   . DES usage Mother   . Kidney disease Father   . Hypertension Father   . Acute myelogenous leukemia Sister        APL  . Sickle cell trait Daughter     Social History: Social History   Social History  . Marital status: Widowed    Spouse name: N/A  . Number of children: N/A  . Years of education: N/A   Occupational History  . Not on file.   Social History Main Topics  . Smoking status: Never Smoker  . Smokeless tobacco: Never Used     Comment: Patient reports extensive passive tobacco smoke exposure  .  Alcohol use No  . Drug use: No  . Sexual activity: No   Other Topics Concern  . Not on file   Social History Narrative  . No narrative on file    Allergies: Allergies  Allergen Reactions  .  Morphine And Related     Generalized itching    Medications:  Current Outpatient Prescriptions  Medication Sig Dispense Refill  . BIOTIN 5000 PO Take 5,000 mg by mouth at bedtime.    . cholecalciferol (VITAMIN D) 1000 UNITS tablet Take 1,000 Units by mouth daily.    Marland Kitchen diltiazem (CARDIZEM CD) 360 MG 24 hr capsule Take 360 mg by mouth daily.    . fluticasone (FLONASE) 50 MCG/ACT nasal spray Place 2 sprays into both nostrils daily as needed for allergies or rhinitis.    . fluticasone (FLOVENT HFA) 44 MCG/ACT inhaler Inhale 2 puffs into the lungs 2 (two) times daily as needed (allergies).    . gabapentin (NEURONTIN) 300 MG capsule Take 300 mg by mouth at bedtime.     . hydrochlorothiazide (MICROZIDE) 12.5 MG capsule Take 1 capsule (12.5 mg total) by mouth daily. 90 capsule 3  . losartan-hydrochlorothiazide (HYZAAR) 100-12.5 MG tablet Take 1 tablet by mouth daily. 90 tablet 3  . magnesium oxide (MAG-OX) 400 MG tablet Take 1 tablet by mouth 2 (two) times daily.  3  . metFORMIN (GLUCOPHAGE) 500 MG tablet Take 500 mg by mouth every evening.     . metoprolol succinate (TOPROL-XL) 25 MG 24 hr tablet Take 0.5 tablets (12.5 mg total) by mouth daily. 45 tablet 3  . montelukast (SINGULAIR) 10 MG tablet Take 10 mg by mouth daily.  1  . XARELTO 20 MG TABS tablet Take 20 mg by mouth daily.  3  . XOPENEX HFA 45 MCG/ACT inhaler Inhale 1-2 puffs into the lungs every 6 (six) hours as needed for wheezing. Reported on 08/08/2015    . ASMANEX HFA 100 MCG/ACT AERO Inhale 2 puffs into the lungs 2 (two) times daily.  3   No current facility-administered medications for this visit.     Review of Systems: Review of Systems  HENT:   Positive for voice change.   Respiratory: Positive for cough, shortness of breath and wheezing. Negative for hemoptysis.   Cardiovascular: Positive for chest pain.  Neurological: Speech difficulty:  -  All other systems reviewed and are negative.    PHYSICAL EXAMINATION Blood  pressure (!) 144/82, pulse 90, temperature 98.5 F (36.9 C), temperature source Oral, resp. rate 18, height '5\' 3"'  (1.6 m), weight 171 lb 3.2 oz (77.7 kg), SpO2 100 %.  ECOG PERFORMANCE STATUS: 1 - Symptomatic but completely ambulatory  Physical Exam  Constitutional: She is oriented to person, place, and time and well-developed, well-nourished, and in no distress. No distress.  HENT:  Head: Normocephalic and atraumatic.  Mouth/Throat: Oropharynx is clear and moist. No oropharyngeal exudate.  Eyes: Pupils are equal, round, and reactive to light. Conjunctivae and EOM are normal. No scleral icterus.  Neck: Normal range of motion. No thyromegaly present.  Cardiovascular: Normal rate, regular rhythm, normal heart sounds and intact distal pulses.   No murmur heard. Pulmonary/Chest: Effort normal and breath sounds normal. No respiratory distress. She has no wheezes. She has no rales.  Abdominal: Soft. Bowel sounds are normal. She exhibits no distension and no mass. There is no tenderness. There is no rebound and no guarding.  Musculoskeletal: Normal range of motion. She exhibits no edema.  Lymphadenopathy:    She has  no cervical adenopathy.  Neurological: She is alert and oriented to person, place, and time. She has normal reflexes. She displays normal reflexes. No cranial nerve deficit. Coordination normal.  Skin: Skin is warm. No rash noted. She is not diaphoretic. No erythema. No pallor.     LABORATORY DATA: I have personally reviewed the data as listed: Appointment on 05/30/2017  Component Date Value Ref Range Status  . WBC 05/30/2017 4.7  3.9 - 10.3 10e3/uL Final  . NEUT# 05/30/2017 2.1  1.5 - 6.5 10e3/uL Final  . HGB 05/30/2017 13.7  11.6 - 15.9 g/dL Final  . HCT 05/30/2017 41.2  34.8 - 46.6 % Final  . Platelets 05/30/2017 359  145 - 400 10e3/uL Final  . MCV 05/30/2017 91.4  79.5 - 101.0 fL Final  . MCH 05/30/2017 30.4  25.1 - 34.0 pg Final  . MCHC 05/30/2017 33.3  31.5 - 36.0 g/dL  Final  . RBC 05/30/2017 4.51  3.70 - 5.45 10e6/uL Final  . RDW 05/30/2017 13.4  11.2 - 14.5 % Final  . lymph# 05/30/2017 2.0  0.9 - 3.3 10e3/uL Final  . MONO# 05/30/2017 0.3  0.1 - 0.9 10e3/uL Final  . Eosinophils Absolute 05/30/2017 0.3  0.0 - 0.5 10e3/uL Final  . Basophils Absolute 05/30/2017 0.0  0.0 - 0.1 10e3/uL Final  . NEUT% 05/30/2017 44.5  38.4 - 76.8 % Final  . LYMPH% 05/30/2017 42.1  14.0 - 49.7 % Final  . MONO% 05/30/2017 6.6  0.0 - 14.0 % Final  . EOS% 05/30/2017 6.2  0.0 - 7.0 % Final  . BASO% 05/30/2017 0.6  0.0 - 2.0 % Final  . Sodium 05/30/2017 140  136 - 145 mEq/L Final  . Potassium 05/30/2017 4.1  3.5 - 5.1 mEq/L Final  . Chloride 05/30/2017 105  98 - 109 mEq/L Final  . CO2 05/30/2017 27  22 - 29 mEq/L Final  . Glucose 05/30/2017 91  70 - 140 mg/dl Final   Glucose reference range is for nonfasting patients. Fasting glucose reference range is 70- 100.  Marland Kitchen BUN 05/30/2017 10.9  7.0 - 26.0 mg/dL Final  . Creatinine 05/30/2017 1.1  0.6 - 1.1 mg/dL Final  . Total Bilirubin 05/30/2017 0.49  0.20 - 1.20 mg/dL Final  . Alkaline Phosphatase 05/30/2017 90  40 - 150 U/L Final  . AST 05/30/2017 17  5 - 34 U/L Final  . ALT 05/30/2017 15  0 - 55 U/L Final  . Total Protein 05/30/2017 8.4* 6.4 - 8.3 g/dL Final  . Albumin 05/30/2017 4.4  3.5 - 5.0 g/dL Final  . Calcium 05/30/2017 9.7  8.4 - 10.4 mg/dL Final  . Anion Gap 05/30/2017 8  3 - 11 mEq/L Final  . EGFR 05/30/2017 >60  >60 ml/min/1.73 m2 Final   eGFR is calculated using the CKD-EPI Creatinine Equation (2009)  . Interpretation 05/30/2017 Comment   Final   Comment: Peripheral Blood: No evidence of paroxysmal nocturnal hemoglobinuria (PNH).   . Comment: 05/30/2017 Comment   Final   Comment: At the sensitivity level of this assay (typically 0.01-0.1%), these results do not support a diagnosis of paroxysmal nocturnal hemoglobinuria (PNH). Correlation with all available clinical, laboratory, and morphologic data is  recommended. (sensitivity typically 0.01-0.1%; lower limit of detection dependent on number of cells present and events acquired, typically 90,000+ events acquired)   . Specimen: 05/30/2017 Peripheral Blood   Final  . Submitted Dx: 05/30/2017 Comment   Final   Evaluation for paroxysmal nocturnal hemoglobinuria (PNH)  .  Viability: 05/30/2017 99%   Final   (7AAD exclusion)  . Cell Population 05/30/2017 Comment   Final                            Population Analysis  . Granulocytes: 05/30/2017 Comment   Final   No GPI-anchor deficiency  . Monocytes: 05/30/2017 Comment   Final   No GPI-anchor deficiency  . Antibodies Performed: 05/30/2017 Comment   Final   CD14, CD15, CD24, CD45, CD64, FLAER  . Comment: 05/30/2017 Comment   Final   Comment: This test was developed and its performance characteristics determined by [facility]. It has not been cleared or approved by the Food and Drug Administration (FDA). The FDA has determined that such clearance or approval is not necessary. Any image(s) that accompany this report is/are a representative image(s) only and should not be used to render a diagnosis.   . Director Review PNH 05/30/2017 Comment   Final   Reviewed By: Dimas Millin, M.D.  . Antithrombin Activity 05/30/2017 145* 75 - 135 % Final   Comment: An elevated antithrombin activity is of no known clinical significance. Direct Xa inhibitor anticoagulants such as rivaroxaban, apixaban and edoxaban will lead to spuriously elevated antithrombin activity levels possibly masking a deficiency.   . Beta-2 Glycoprotein I Ab, IgG 05/30/2017 <9  0 - 20 GPI IgG units Final   Comment: The reference interval reflects a 3SD or 99th percentile interval, which is thought to represent a potentially clinically significant result in accordance with the International Consensus Statement on the classification criteria for definitive antiphospholipid syndrome (APS). J Thromb Haem 2006;4:295-306.   .  Beta-2 Glyco 1 IgA 05/30/2017 <9  0 - 25 GPI IgA units Final   Comment: The reference interval reflects a 3SD or 99th percentile interval, which is thought to represent a potentially clinically significant result in accordance with the International Consensus Statement on the classification criteria for definitive antiphospholipid syndrome (APS). J Thromb Haem 2006;4:295-306.   . Beta-2 Glyco 1 IgM 05/30/2017 <9  0 - 32 GPI IgM units Final   Comment: The reference interval reflects a 3SD or 99th percentile interval, which is thought to represent a potentially clinically significant result in accordance with the International Consensus Statement on the classification criteria for definitive antiphospholipid syndrome (APS). J Thromb Haem 2006;4:295-306.   Marland Kitchen Anticardiolipin Ab,IgG,Qn 05/30/2017 <9  0 - 14 GPL U/mL Final   Comment:                                          Negative:              <15                                          Indeterminate:     15 - 20                                          Low-Med Positive: >20 - 80  High Positive:         >80   . Anticardiolipin Ab,IgM,Qn 05/30/2017 <9  0 - 12 MPL U/mL Final   Comment:                                          Negative:              <13                                          Indeterminate:     13 - 20                                          Low-Med Positive: >20 - 80                                          High Positive:         >80   . Anticardiolipin Ab,IgA,Qn 05/30/2017 <9  0 - 11 APL U/mL Final   Comment:                                          Negative:              <12                                          Indeterminate:     12 - 20                                          Low-Med Positive: >20 - 80                                          High Positive:         >80   . Homocysteine 05/30/2017 11.8  0.0 - 15.0 umol/L Final  . PTT-LA 05/30/2017 39.7  0.0 - 51.9 sec  Final  . dRVVT 05/30/2017 109.5* 0.0 - 47.0 sec Final  . dRVVT Mix 05/30/2017 86.9* 0.0 - 47.0 sec Final  . dRVVT Confirm 05/30/2017 1.4* 0.8 - 1.2 ratio Final  . Lupus Reflex Interpretation 05/30/2017 Comment:   Final   Comment: Results are consistent with the presence of a lupus anticoagulant. NOTE: Only persistent lupus anticoagulants are thought to be of clinical significance. For this reason, repeat testing in 12 or more weeks after an initial positive result should be considered to confirm or refute the presence of a lupus anticoagulant, depending on clinical presentation. Results of lupus anticoagulant tests may be falsely positive in the presence of certain anticoagulant therapies.   . Protein S-Functional 05/30/2017 142* 63 - 140 %  Final   Comment: An elevated protein S activity is of no known clinical significance. Protein S activity may be falsely increased (masking an abnormal, low result) in patients receiving direct Xa inhibitor (e.g., rivaroxaban, apixaban, edoxaban) or a direct thrombin inhibitor (e.g., dabigatran) anticoagulant treatment due to assay interference by these drugs.   . Protein C Antigen 05/30/2017 105  60 - 150 % Final  . Protein C-Functional 05/30/2017 155  73 - 180 % Final  . Factor II Activity 05/30/2017 126  50 - 154 % Final  . Protein S, Total 05/30/2017 112  60 - 150 % Final   Comment: This test was developed and its performance characteristics determined by [facility]. It has not been cleared or approved by the Food and Drug Administration.   . Protein S, Free 05/30/2017 86  57 - 157 % Final   Comment: This test was developed and its performance characteristics determined by [facility]. It has not been cleared or approved by the Food and Drug Administration.   Office Visit on 05/30/2017  Component Date Value Ref Range Status  . Factor II, DNA Analysis 05/30/2017 Comment   Final   Comment: NEGATIVE No mutation identified. Comment: A point mutation  ([id number]) in the factor II (prothrombin) gene is the second most common cause of inherited thrombophilia. The incidence of this mutation in the [place] Caucasian population is about 2% and in the [place] American population it is approximately 0.5%. This mutation is rare in the [place] and Native American population. Being heterozygous for a prothrombin mutation increases the risk for developing venous thrombosis about 2 to 3 times above the general population risk. Being homozygous for the prothrombin gene mutation increases the relative risk for venous thrombosis further, although it is not yet known how much further the risk is increased. In women heterozygous for the prothrombin gene mutation, the use of estrogen containing oral contraceptives increases the relative risk of venous thrombosis about 16 times and the risk of developing cerebral thrombosis is also significantly increased. In pregnancy the prothrombin                           gene mutation increases risk for venous thrombosis and may increase risk for stillbirth, placental abruption, pre-eclampsia and fetal growth restriction. If the patient possesses two or more congenital or acquired thrombophilic risk factors, the risk for thrombosis may rise to more than the sum of the risk ratios for the individual mutations. This assay detects only the prothrombin G20210A mutation and does not measure genetic abnormalities elsewhere in the genome. Other thrombotic risk factors may be pursued through systematic clinical laboratory analysis. These factors include the R506Q (Leiden) mutation in the Factor V gene, plasma homocysteine levels, as well as testing for deficiencies of antithrombin III, protein C and protein S. Genetic Counselors are available for health care providers to discuss results at [phone]). Methodology: DNA analysis of the Factor II gene was performed by PCR amplification followed by restriction analysis.  The diagnostic                           sensitivity is >99% for both. All the tests must be combined with clinical information for the most accurate interpretation. Molecular-based testing is highly accurate, but as in any laboratory test, diagnostic errors may occur. This test was developed and its performance characteristics determined by [facility]. It has not been cleared or approved by  the Food and Drug Administration. Poort SR, et al. Blood. 1996; 02:7741-2878. Varga EA. Circulation. 2004; 676:H20-N47. Mervin Hack, et [place]; 19:700-703. Allison Quarry, PhD, [facility] Ruben Reason, PhD, [facility] Annetta Maw, M.S., PhD, [facility] Alfredo Bach, PhD, [facility] Norva Riffle, PhD, [facility] Earlean Polka, PhD, [facility]   Lab on 05/29/2017  Component Date Value Ref Range Status  . Glucose 05/29/2017 93  65 - 99 mg/dL Final  . BUN 05/29/2017 13  6 - 24 mg/dL Final  . Creatinine, Ser 05/29/2017 1.10* 0.57 - 1.00 mg/dL Final  . GFR calc non Af Amer 05/29/2017 55* >59 mL/min/1.73 Final  . GFR calc Af Amer 05/29/2017 64  >59 mL/min/1.73 Final  . BUN/Creatinine Ratio 05/29/2017 12  9 - 23 Final  . Sodium 05/29/2017 141  134 - 144 mmol/L Final  . Potassium 05/29/2017 4.5  3.5 - 5.2 mmol/L Final  . Chloride 05/29/2017 99  96 - 106 mmol/L Final  . CO2 05/29/2017 25  20 - 29 mmol/L Final  . Calcium 05/29/2017 9.5  8.7 - 10.2 mg/dL Final        Ardath Sax, MD

## 2017-06-10 ENCOUNTER — Ambulatory Visit (HOSPITAL_COMMUNITY): Payer: BC Managed Care – PPO

## 2017-06-10 ENCOUNTER — Ambulatory Visit (HOSPITAL_COMMUNITY)
Admission: RE | Admit: 2017-06-10 | Discharge: 2017-06-10 | Disposition: A | Discharge status: Home / Self Care | Payer: BC Managed Care – PPO | Source: Ambulatory Visit | Attending: Hematology and Oncology | Admitting: Hematology and Oncology

## 2017-06-10 DIAGNOSIS — R51 Headache: Principal | ICD-10-CM

## 2017-06-10 DIAGNOSIS — R490 Dysphonia: Secondary | ICD-10-CM

## 2017-06-10 DIAGNOSIS — R519 Headache, unspecified: Secondary | ICD-10-CM

## 2017-06-10 DIAGNOSIS — J029 Acute pharyngitis, unspecified: Secondary | ICD-10-CM

## 2017-06-10 DIAGNOSIS — R1084 Generalized abdominal pain: Secondary | ICD-10-CM

## 2017-06-10 MED ORDER — IOPAMIDOL (ISOVUE-300) INJECTION 61%
INTRAVENOUS | Status: AC
  Filled 2017-06-10: qty 100

## 2017-06-12 ENCOUNTER — Ambulatory Visit (HOSPITAL_COMMUNITY)
Admission: RE | Admit: 2017-06-12 | Discharge: 2017-06-12 | Disposition: A | Discharge status: Home / Self Care | Payer: BC Managed Care – PPO | Source: Ambulatory Visit | Attending: Hematology and Oncology | Admitting: Hematology and Oncology

## 2017-06-12 DIAGNOSIS — J029 Acute pharyngitis, unspecified: Secondary | ICD-10-CM | POA: Diagnosis not present

## 2017-06-12 DIAGNOSIS — R1084 Generalized abdominal pain: Secondary | ICD-10-CM | POA: Insufficient documentation

## 2017-06-12 DIAGNOSIS — R51 Headache: Secondary | ICD-10-CM | POA: Insufficient documentation

## 2017-06-12 DIAGNOSIS — J352 Hypertrophy of adenoids: Secondary | ICD-10-CM | POA: Insufficient documentation

## 2017-06-12 DIAGNOSIS — J349 Unspecified disorder of nose and nasal sinuses: Secondary | ICD-10-CM | POA: Diagnosis not present

## 2017-06-12 MED ORDER — IOPAMIDOL (ISOVUE-300) INJECTION 61%
INTRAVENOUS | Status: AC
  Administered 2017-06-12: 100 mL
  Filled 2017-06-12: qty 100

## 2017-06-13 ENCOUNTER — Telehealth: Payer: Self-pay | Admitting: Hematology and Oncology

## 2017-06-13 ENCOUNTER — Ambulatory Visit (HOSPITAL_BASED_OUTPATIENT_CLINIC_OR_DEPARTMENT_OTHER): Payer: BC Managed Care – PPO

## 2017-06-13 ENCOUNTER — Ambulatory Visit (HOSPITAL_BASED_OUTPATIENT_CLINIC_OR_DEPARTMENT_OTHER): Payer: BC Managed Care – PPO | Admitting: Hematology and Oncology

## 2017-06-13 VITALS — BP 139/87 | HR 88 | Temp 98.6°F | Resp 18 | Ht 63.0 in | Wt 174.0 lb

## 2017-06-13 DIAGNOSIS — R490 Dysphonia: Secondary | ICD-10-CM

## 2017-06-13 DIAGNOSIS — I2699 Other pulmonary embolism without acute cor pulmonale: Secondary | ICD-10-CM | POA: Diagnosis not present

## 2017-06-13 DIAGNOSIS — R0600 Dyspnea, unspecified: Secondary | ICD-10-CM | POA: Diagnosis not present

## 2017-06-13 DIAGNOSIS — R791 Abnormal coagulation profile: Secondary | ICD-10-CM

## 2017-06-13 DIAGNOSIS — R779 Abnormality of plasma protein, unspecified: Secondary | ICD-10-CM

## 2017-06-13 DIAGNOSIS — R062 Wheezing: Secondary | ICD-10-CM

## 2017-06-13 DIAGNOSIS — E8809 Other disorders of plasma-protein metabolism, not elsewhere classified: Secondary | ICD-10-CM

## 2017-06-13 DIAGNOSIS — R6884 Jaw pain: Secondary | ICD-10-CM | POA: Diagnosis not present

## 2017-06-13 DIAGNOSIS — Z7901 Long term (current) use of anticoagulants: Secondary | ICD-10-CM | POA: Diagnosis not present

## 2017-06-13 DIAGNOSIS — D6852 Prothrombin gene mutation: Secondary | ICD-10-CM

## 2017-06-13 DIAGNOSIS — R05 Cough: Secondary | ICD-10-CM

## 2017-06-13 NOTE — Telephone Encounter (Signed)
Scheduled appt per 1/11 los - Gave patient AVS and calender per los.  

## 2017-06-14 LAB — PROTEIN S, ANTIGEN, FREE: Protein S, Free: 92 % (ref 57–157)

## 2017-06-14 LAB — RHEUMATOID FACTOR

## 2017-06-14 LAB — PROTEIN S, TOTAL: Protein S, Total: 96 % (ref 60–150)

## 2017-06-14 LAB — ANTI-DNA ANTIBODY, DOUBLE-STRANDED: dsDNA Ab: 1 IU/mL (ref 0–9)

## 2017-06-17 LAB — PROTEIN ELECTROPHORESIS, SERUM, WITH REFLEX
A/G RATIO SPE: 1.1 (ref 0.7–1.7)
Albumin: 3.9 g/dL (ref 2.9–4.4)
Alpha 1: 0.2 g/dL (ref 0.0–0.4)
Alpha 2: 0.6 g/dL (ref 0.4–1.0)
Beta: 1.2 g/dL (ref 0.7–1.3)
GLOBULIN, TOTAL: 3.7 g/dL (ref 2.2–3.9)
Gamma Globulin: 1.7 g/dL (ref 0.4–1.8)
Total Protein: 7.6 g/dL (ref 6.0–8.5)

## 2017-06-17 LAB — ANTINUCLEAR ANTIBODIES, IFA: ANTINUCLEAR ANTIBODIES, IFA: NEGATIVE

## 2017-06-25 ENCOUNTER — Encounter: Payer: Self-pay | Admitting: Hematology and Oncology

## 2017-06-25 NOTE — Assessment & Plan Note (Signed)
61 y.o. 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 Initial thrombotic event was extensively evaluated including a thrombophilia panel, which unfortunately did not include a prothrombin gene mutation. 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 Repeat testing is indicated.  Patient has an interesting constellation of symptoms with loss of taste, and voice change that may hint at another pathological process occurring in the cervical or cerebral areas. Imaging of the areas did not reveal any significant pathology.  Plan: --Recommend indefinite anticoagulation, Rivaroxaban (Xarelto) appears to be adequate for the task at this time --Patient is due for colonoscopy which needs to be obtained any time now. Patient will need to be bridged with Lovenox for the procedure --will obtain additional lab work as outlined below due to positive dRVVT and elevated protein value in the context of connective tissue disorder. -- Return to the clinic in two weeks to review the findings.

## 2017-06-25 NOTE — Progress Notes (Signed)
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 Initial thrombotic event was extensively evaluated including a thrombophilia panel, which unfortunately did not include a prothrombin gene mutation. 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 Repeat testing is indicated.  Patient has an interesting constellation of symptoms with loss of taste, and voice change that may hint at another pathological process occurring in the cervical or cerebral areas. Imaging of the areas did not reveal any significant pathology.  Plan: --Recommend indefinite anticoagulation, Rivaroxaban (Xarelto) appears to be adequate for the task at this time --Patient is due for colonoscopy which needs to be obtained any time now. Patient will need to be bridged with Lovenox for the procedure --will obtain additional lab work as outlined below due to positive dRVVT and elevated protein value in the context of connective tissue disorder. -- Return to the clinic in two weeks to review the findings.  Voice recognition software was used and creation of this note. Despite my best effort at editing the text, some misspelling/errors may have occurred.  Orders Placed This Encounter  Procedures  . ANA, IFA (with reflex)    Standing Status:   Future    Number of Occurrences:   1    Standing Expiration Date:   06/13/2018  . Anti-DNA antibody, double-stranded    Standing Status:   Future    Number of Occurrences:   1    Standing Expiration Date:   06/13/2018  . Rheumatoid factor    Standing Status:   Future    Number of Occurrences:   1   Standing Expiration Date:   06/13/2018  . SPEP with reflex to IFE    Standing Status:   Future    Number of Occurrences:   1    Standing Expiration Date:   06/13/2018    All questions were answered.  . The patient knows to call the clinic with any problems, questions or concerns.  This note was electronically signed.    History of Presenting Illness [name] is a 61 y.o. female followed in the [facility] for evaluation and management recommendations regarding her recurrent unprovoked venous thromboembolic phenomena. Please see hematological history below for details.   At the present time, patient is taking her prescribed her Rivaroxaban (Xarelto). [name] continues to have chronic dyspnea on exertion with previous evaluations negative for pulmonary hypertension, chronic thromboembolism. [name] continues to have dry cough, wheezing, and pleuritic chest pain. [name] also complains of hoarse voice. Interestingly, [name] has lost sensation of taste following her last pulmonary embolism. [name] also complains of left mandibular pain with previous history of TMJ surgery with wire placement..  Oncological/hematological History: --Event #1, 06/21/11: Presented with 4 weeks of difficult time breathing associated with sharp pleuritic pain in her midsternal area that radiates "straight through to my back, between my shoulder blades." Pain was 7/10, and improved down to 5/10 with ASA. Pain was exacerbated when lying down and relieved when sitting up. At the onset of the SOB, patient had an Urgent Care visit with a slight fever, entire body chills. Subsequently, [name] started becoming more easily fatigued, frustrated in addition to having intermittent insomnia due to her breathing pain.  There was no preceding period of immobilization, trauma, or surgery.             --CTA Chest, 06/21/11: Multiple bilateral segmental pulmonary emboli.             --Doppler US BL LE, 06/22/11: No evidence of deep vein thrombosis.              --Thrombophilia Panel, 2012: Negative for antiphospholipid antibody syndrome, normal values for protein C, protein S, and antithrombin III. Negative for factor V Leiden mutation             --Treatment: Warfarin x3 yrs  --CTA Chest, 12/10/11: No evidence of pulmonary emboli or mediastinal/hilar lymphadenopathy.  --Event #2, 01/25/17: Presented to the ED for evaluation of 2-week shortness of breath with exertion and pleuritic chest pain. No light-headedness or dizziness. No near syncope.  once again, no distinct provoking factors identified such as immobility, trauma, or surgery.             --CTA Chest, 01/25/17: Bilateral lobar branches pulmonary emboli with significant clot burden and evidence of right ventricular strain.             --Doppler US BL LE, 01/26/17: No evidence of deep vein thrombosis.             --Treatment: Rivaroxaban --Labs, 05/30/17: Thrombophilia panel is positive for DRVVT -- DRVVT 109.5, DRVVT Mix 86.9, DRVVT Confirm 1.4 Suggesting presence of possible lupus anticoagulant, no other significant abnormalities.   Medical History: Past Medical History:  Diagnosis Date  . Acute bronchitis   . Acute maxillary sinusitis   . Allergic rhinitis   . Dysmetabolic syndrome    History of  . Essential hypertension, malignant   . Extrinsic asthma, unspecified   . Fibroids   . H/O: GI bleed   . History of arteritis   . Ischemic colitis, enteritis, or enterocolitis ([place])    Hisotry of  . Obesity, unspecified   . Otitis media of both ears   . 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 Sinusitis   . Type II or unspecified type diabetes mellitus without mention of complication, not stated as uncontrolled    oral meds-insulin resistance  . Undifferentiated connective tissue disease ([place])    History of    Surgical History: Past Surgical History:  Procedure Laterality Date  . ABDOMINAL HYSTERECTOMY  2000  . BREAST EXCISIONAL  BIOPSY Left 1978  . CARDIAC CATHETERIZATION  09/25/05  . COLONOSCOPY W/ BIOPSIES      Family History: Family History  Problem Relation Age of Onset  . Kidney disease Mother   . Congestive Heart Failure Mother   . DES usage Mother   . Kidney disease Father   . Hypertension Father   . Acute myelogenous leukemia Sister        APL  . Sickle cell trait Daughter     Social History: Social History   Socioeconomic History  . Marital status: Widowed    Spouse name: Not on file  . Number of children: Not on file  . Years of education: Not on file  . Highest education level: Not on file  Social Needs  . Financial resource strain: Not on file  . Food insecurity - worry: Not on file  . Food insecurity - inability: Not on file  . Transportation needs - medical: Not on file  . Transportation needs - non-medical: Not on file  Occupational History  . Not  on file  Tobacco Use  . Smoking status: Never Smoker  . Smokeless tobacco: Never Used  . Tobacco comment: Patient reports extensive passive tobacco smoke exposure  Substance and Sexual Activity  . Alcohol use: No  . Drug use: No  . Sexual activity: No  Other Topics Concern  . Not on file  Social History Narrative  . Not on file    Allergies: Allergies  Allergen Reactions  . Morphine And Related     Generalized itching    Medications:  Current Outpatient Medications  Medication Sig Dispense Refill  . ASMANEX HFA 100 MCG/ACT AERO Inhale 2 puffs into the lungs 2 (two) times daily.  3  . BIOTIN 5000 PO Take 5,000 mg by mouth at bedtime.    . cholecalciferol (VITAMIN D) 1000 UNITS tablet Take 1,000 Units by mouth daily.    Marland Kitchen diltiazem (CARDIZEM CD) 360 MG 24 hr capsule Take 360 mg by mouth daily.    . fluticasone (FLONASE) 50 MCG/ACT nasal spray Place 2 sprays into both nostrils daily as needed for allergies or rhinitis.    . fluticasone (FLOVENT HFA) 44 MCG/ACT inhaler Inhale 2 puffs into the lungs 2 (two) times daily as  needed (allergies).    . gabapentin (NEURONTIN) 300 MG capsule Take 300 mg by mouth at bedtime.     . hydrochlorothiazide (MICROZIDE) 12.5 MG capsule Take 1 capsule (12.5 mg total) by mouth daily. 90 capsule 3  . losartan-hydrochlorothiazide (HYZAAR) 100-12.5 MG tablet Take 1 tablet by mouth daily. 90 tablet 3  . magnesium oxide (MAG-OX) 400 MG tablet Take 1 tablet by mouth 2 (two) times daily.  3  . metFORMIN (GLUCOPHAGE) 500 MG tablet Take 500 mg by mouth every evening.     . metoprolol succinate (TOPROL-XL) 25 MG 24 hr tablet Take 0.5 tablets (12.5 mg total) by mouth daily. 45 tablet 3  . montelukast (SINGULAIR) 10 MG tablet Take 10 mg by mouth daily.  1  . XARELTO 20 MG TABS tablet Take 20 mg by mouth daily.  3  . XOPENEX HFA 45 MCG/ACT inhaler Inhale 1-2 puffs into the lungs every 6 (six) hours as needed for wheezing. Reported on 08/08/2015     No current facility-administered medications for this visit.     Review of Systems: Review of Systems  HENT:   Positive for voice change.   Respiratory: Positive for cough, shortness of breath and wheezing. Negative for hemoptysis.   Cardiovascular: Positive for chest pain.  Neurological: Speech difficulty:  -  All other systems reviewed and are negative.    PHYSICAL EXAMINATION Blood pressure 139/87, pulse 88, temperature 98.6 F (37 C), temperature source Oral, resp. rate 18, height 5\' 3"  (1.6 m), weight 174 lb (78.9 kg), SpO2 98 %.  ECOG PERFORMANCE STATUS: 1 - Symptomatic but completely ambulatory  Physical Exam  Constitutional: [name] is oriented to person, place, and time and well-developed, well-nourished, and in no distress. No distress.  HENT:  Head: Normocephalic and atraumatic.  Mouth/Throat: Oropharynx is clear and moist. No oropharyngeal exudate.  Eyes: Conjunctivae and EOM are normal. Pupils are equal, round, and reactive to light. No scleral icterus.  Neck: Normal range of motion. No thyromegaly present.  Cardiovascular:  Normal rate, regular rhythm, normal heart sounds and intact distal pulses.  No murmur heard. Pulmonary/Chest: Effort normal and breath sounds normal. No respiratory distress. [name] has no wheezes. [name] has no rales.  Abdominal: Soft. Bowel sounds are normal. [name] exhibits no distension and no mass.  There is no tenderness. There is no rebound and no guarding.  Musculoskeletal: Normal range of motion. [name] exhibits no edema.  Lymphadenopathy:    [name] has no cervical adenopathy.  Neurological: [name] is alert and oriented to person, place, and time. [name] has normal reflexes. No cranial nerve deficit. Coordination normal.  Skin: Skin is warm. No rash noted. [name] is not diaphoretic. No erythema. No pallor.     LABORATORY DATA: I have personally reviewed the data as listed: Appointment on 06/13/2017  Component Date Value Ref Range Status  . ANA Titer 1 06/13/2017 Negative   Final   Comment:                                                     Negative   <1:80                                                     Borderline  1:80                                                     Positive   >1:80   . dsDNA Ab 06/13/2017 1  0 - 9 IU/mL Final   Comment:                                                   Negative      <5                                                   Equivocal  5 - 9                                                   Positive      >9   . RA Latex Turbid. 06/13/2017 <10.0  0.0 - 13.9 IU/mL Final  . Total Protein 06/13/2017 7.6  6.0 - 8.5 g/dL Final  . Albumin 06/13/2017 3.9  2.9 - 4.4 g/dL Final  . Alpha 1 06/13/2017 0.2  0.0 - 0.4 g/dL Final  . Alpha 2 06/13/2017 0.6  0.4 - 1.0 g/dL Final  . Beta 06/13/2017 1.2  0.7 - 1.3 g/dL Final  . Gamma Globulin 06/13/2017 1.7  0.4 - 1.8 g/dL Final  . M-Spike, % 06/13/2017 Not Observed  Not Observed g/dL Final  . GLOBULIN, TOTAL 06/13/2017 3.7  2.2 - 3.9 g/dL Final  . A/G Ratio 06/13/2017 1.1  0.7 - 1.7 Final  . Please Note: 06/13/2017 Comment    Final   Comment: Protein electrophoresis scan will follow via computer, mail, or courier  delivery.   . Interpretation(See Below) 06/13/2017 Comment   Final   Comment: The SPE pattern appears essentially unremarkable. Evidence of monoclonal protein is not apparent.   . Protein S, Free 06/13/2017 92  57 - 157 % Final   Comment: This test was developed and its performance characteristics determined by [facility]. It has not been cleared or approved by the Food and Drug Administration.   . Protein S, Total 06/13/2017 96  60 - 150 % Final   Comment: This test was developed and its performance characteristics determined by [facility]. It has not been cleared or approved by the Food and Drug Administration.        Ardath Sax, MD

## 2017-06-27 ENCOUNTER — Other Ambulatory Visit: Payer: Self-pay

## 2017-06-27 ENCOUNTER — Encounter: Payer: Self-pay | Admitting: Hematology and Oncology

## 2017-06-27 ENCOUNTER — Ambulatory Visit (HOSPITAL_BASED_OUTPATIENT_CLINIC_OR_DEPARTMENT_OTHER): Payer: BC Managed Care – PPO | Admitting: Hematology and Oncology

## 2017-06-27 VITALS — BP 136/76 | HR 95 | Temp 98.5°F | Resp 17 | Ht 63.0 in | Wt 177.9 lb

## 2017-06-27 DIAGNOSIS — E8809 Other disorders of plasma-protein metabolism, not elsewhere classified: Secondary | ICD-10-CM

## 2017-06-27 DIAGNOSIS — R0600 Dyspnea, unspecified: Secondary | ICD-10-CM

## 2017-06-27 DIAGNOSIS — I2699 Other pulmonary embolism without acute cor pulmonale: Secondary | ICD-10-CM

## 2017-06-27 DIAGNOSIS — R490 Dysphonia: Secondary | ICD-10-CM

## 2017-06-27 DIAGNOSIS — I2692 Saddle embolus of pulmonary artery without acute cor pulmonale: Secondary | ICD-10-CM | POA: Diagnosis not present

## 2017-06-27 DIAGNOSIS — R6884 Jaw pain: Secondary | ICD-10-CM | POA: Diagnosis not present

## 2017-06-27 DIAGNOSIS — R062 Wheezing: Secondary | ICD-10-CM

## 2017-06-27 DIAGNOSIS — R791 Abnormal coagulation profile: Secondary | ICD-10-CM

## 2017-06-27 DIAGNOSIS — R779 Abnormality of plasma protein, unspecified: Secondary | ICD-10-CM

## 2017-06-27 DIAGNOSIS — R05 Cough: Secondary | ICD-10-CM

## 2017-06-27 DIAGNOSIS — R0789 Other chest pain: Secondary | ICD-10-CM

## 2017-06-27 DIAGNOSIS — Z7901 Long term (current) use of anticoagulants: Secondary | ICD-10-CM

## 2017-07-05 ENCOUNTER — Telehealth: Payer: Self-pay | Admitting: Hematology and Oncology

## 2017-07-05 NOTE — Telephone Encounter (Signed)
Scheduled appt per 11/15 los - left message for patient regarding appt. Sending confirmation letter in the mail.

## 2017-07-16 NOTE — Assessment & Plan Note (Signed)
61 y.o. 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 Initial thrombotic event was extensively evaluated including a thrombophilia panel, which unfortunately did not include a prothrombin gene mutation. 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  Additional testing reveals no evidence of paraproteinemia, protein S deficiency, or evidence of lupus based on negative ANA, double-stranded DNA antibodies, and negative rheumatoid factor.  In sum, we failed to identify specific provoking etiology for patient's recurrent thrombosis.  With that in mind, indefinite anticoagulation remains our recommendation.  Plan: --Recommend indefinite anticoagulation, Rivaroxaban (Xarelto) appears to be adequate for the task at this time --Patient is due for colonoscopy which needs to be obtained any time now. Patient will need to be bridged with Lovenox for the procedure --rivaroxaban can be stopped 48 hours prior to the planned procedure, starting enoxaparin 1 mg/kg twice daily.  And ask apparent will be held the night before the procedure in the morning of the procedure.  Rivaroxaban can be resumed at the discretion of gastroenterology. --Return to my clinic in 1 year for clinical monitoring

## 2017-07-16 NOTE — Progress Notes (Signed)
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 Initial thrombotic event was extensively evaluated including a thrombophilia panel, which unfortunately did not include a prothrombin gene mutation. 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  Additional testing reveals no evidence of paraproteinemia, protein S deficiency, or evidence of lupus based on negative ANA, double-stranded DNA antibodies, and negative rheumatoid factor.  In sum, we failed to identify specific provoking etiology for patient's recurrent thrombosis.  With that in mind, indefinite anticoagulation remains our recommendation.  Plan: --Recommend indefinite anticoagulation, Rivaroxaban (Xarelto) appears to be adequate for the task at this time --Patient is due for colonoscopy which needs to be obtained any time now. Patient will need to be bridged with Lovenox for the procedure --rivaroxaban can be stopped 48 hours prior to the planned procedure, starting enoxaparin 1 mg/kg twice daily.  And ask apparent will be held the night before the procedure in the morning of the procedure.  Rivaroxaban can be resumed at the discretion of gastroenterology. --Return to my clinic in 1 year for clinical monitoring  Voice recognition software was used and creation of this note. Despite my best effort at editing the text, some misspelling/errors may have occurred.  No orders of the defined types were placed in this encounter.   All questions were answered.  . The patient knows to call the clinic with any problems, questions or concerns.  This note was  electronically signed.    History of Presenting Illness [name] is a 61 y.o. female followed in the [facility] for evaluation and management recommendations regarding her recurrent unprovoked venous thromboembolic phenomena. Please see hematological history below for details.   At the present time, patient is taking her prescribed her Rivaroxaban (Xarelto). She continues to have chronic dyspnea on exertion with previous evaluations negative for pulmonary hypertension, chronic thromboembolism. She continues to have dry cough, wheezing, and pleuritic chest pain. She also complains of hoarse voice. Interestingly, she has lost sensation of taste following her last pulmonary embolism. She also complains of left mandibular pain with previous history of TMJ surgery with wire placement..  Oncological/hematological History: --Event #1, 06/21/11: Presented with 4 weeks of difficult time breathing associated with sharp pleuritic pain in her midsternal area that radiates "straight through to my back, between my shoulder blades." Pain was 7/10, and improved down to 5/10 with ASA. Pain was exacerbated when lying down and relieved when sitting up. At the onset of the SOB, patient had an Urgent Care visit with a slight fever, entire body chills. Subsequently, she started becoming more easily fatigued, frustrated in addition to having intermittent insomnia due to her breathing pain. There was no preceding period of immobilization, trauma, or surgery.             --CTA Chest, 06/21/11: Multiple bilateral segmental pulmonary emboli.             --Doppler US BL LE, 06/22/11: No evidence of deep vein thrombosis.             --Thrombophilia Panel, 2012: Negative for antiphospholipid antibody syndrome, normal values for protein C, protein S, and antithrombin III. Negative for factor V Leiden mutation             --  Treatment: Warfarin x3 yrs  --CTA Chest, 12/10/11: No evidence of pulmonary emboli or  mediastinal/hilar lymphadenopathy.  --Event #2, 01/25/17: Presented to the ED for evaluation of 2-week shortness of breath with exertion and pleuritic chest pain. No light-headedness or dizziness. No near syncope.  once again, no distinct provoking factors identified such as immobility, trauma, or surgery.             --CTA Chest, 01/25/17: Bilateral lobar branches pulmonary emboli with significant clot burden and evidence of right ventricular strain.             --Doppler US BL LE, 01/26/17: No evidence of deep vein thrombosis.             --Treatment: Rivaroxaban --Labs, 05/30/17: Thrombophilia panel is positive for DRVVT -- DRVVT 109.5, DRVVT Mix 86.9, DRVVT Confirm 1.4 Suggesting presence of possible lupus anticoagulant, no other significant abnormalities.   Medical History: Past Medical History:  Diagnosis Date  . Acute bronchitis   . Acute maxillary sinusitis   . Allergic rhinitis   . Dysmetabolic syndrome    History of  . Essential hypertension, malignant   . Extrinsic asthma, unspecified   . Fibroids   . H/O: GI bleed   . History of arteritis   . Ischemic colitis, enteritis, or enterocolitis ([place])    Hisotry of  . Obesity, unspecified   . Otitis media of both ears   . Recurrent pulmonary emboli ([place]) 2012 and 2018   [place] embolism 2018  . Sinusitis   . Type II or unspecified type diabetes mellitus without mention of complication, not stated as uncontrolled    oral meds-insulin resistance  . Undifferentiated connective tissue disease ([place])    History of    Surgical History: Past Surgical History:  Procedure Laterality Date  . ABDOMINAL HYSTERECTOMY  2000  . BREAST EXCISIONAL BIOPSY Left 1978  . CARDIAC CATHETERIZATION  09/25/05  . COLONOSCOPY W/ BIOPSIES      Family History: Family History  Problem Relation Age of Onset  . Kidney disease Mother   . Congestive Heart Failure Mother   . DES usage Mother   . Kidney disease Father   . Hypertension Father   .  Acute myelogenous leukemia Sister        APL  . Sickle cell trait Daughter     Social History: Social History   Socioeconomic History  . Marital status: Widowed    Spouse name: Not on file  . Number of children: Not on file  . Years of education: Not on file  . Highest education level: Not on file  Social Needs  . Financial resource strain: Not on file  . Food insecurity - worry: Not on file  . Food insecurity - inability: Not on file  . Transportation needs - medical: Not on file  . Transportation needs - non-medical: Not on file  Occupational History  . Not on file  Tobacco Use  . Smoking status: Never Smoker  . Smokeless tobacco: Never Used  . Tobacco comment: Patient reports extensive passive tobacco smoke exposure  Substance and Sexual Activity  . Alcohol use: No  . Drug use: No  . Sexual activity: No  Other Topics Concern  . Not on file  Social History Narrative  . Not on file    Allergies: Allergies  Allergen Reactions  . Morphine And Related     Generalized itching    Medications:  Current Outpatient Medications  Medication Sig Dispense Refill  . hydrochlorothiazide (  MICROZIDE) 12.5 MG capsule Take 1 capsule (12.5 mg total) by mouth daily. 90 capsule 3  . magnesium oxide (MAG-OX) 400 MG tablet Take 1 tablet by mouth 2 (two) times daily.  3  . metFORMIN (GLUCOPHAGE) 500 MG tablet Take 500 mg by mouth every evening.     . metoprolol succinate (TOPROL-XL) 25 MG 24 hr tablet Take 0.5 tablets (12.5 mg total) by mouth daily. 45 tablet 3  . montelukast (SINGULAIR) 10 MG tablet Take 10 mg by mouth daily.  1  . XARELTO 20 MG TABS tablet Take 20 mg by mouth daily.  3  . XOPENEX HFA 45 MCG/ACT inhaler Inhale 1-2 puffs into the lungs every 6 (six) hours as needed for wheezing. Reported on 08/08/2015    . ASMANEX HFA 100 MCG/ACT AERO Inhale 2 puffs into the lungs 2 (two) times daily.  3  . BIOTIN 5000 PO Take 5,000 mg by mouth at bedtime.    . cholecalciferol  (VITAMIN D) 1000 UNITS tablet Take 1,000 Units by mouth daily.    Marland Kitchen diltiazem (CARDIZEM CD) 360 MG 24 hr capsule Take 360 mg by mouth daily.    . fluticasone (FLONASE) 50 MCG/ACT nasal spray Place 2 sprays into both nostrils daily as needed for allergies or rhinitis.    . fluticasone (FLOVENT HFA) 44 MCG/ACT inhaler Inhale 2 puffs into the lungs 2 (two) times daily as needed (allergies).    . gabapentin (NEURONTIN) 300 MG capsule Take 300 mg by mouth at bedtime.     Marland Kitchen losartan-hydrochlorothiazide (HYZAAR) 100-12.5 MG tablet Take 1 tablet by mouth daily. 90 tablet 3   No current facility-administered medications for this visit.     Review of Systems: Review of Systems  HENT:   Positive for voice change.   Respiratory: Positive for cough, shortness of breath and wheezing. Negative for hemoptysis.   Cardiovascular: Positive for chest pain.  Neurological: Speech difficulty:  -  All other systems reviewed and are negative.    PHYSICAL EXAMINATION Blood pressure 136/76, pulse 95, temperature 98.5 F (36.9 C), temperature source Oral, resp. rate 17, height 5\' 3"  (1.6 m), weight 177 lb 14.4 oz (80.7 kg), SpO2 100 %.  ECOG PERFORMANCE STATUS: 1 - Symptomatic but completely ambulatory  Physical Exam  Constitutional: She is oriented to person, place, and time and well-developed, well-nourished, and in no distress. No distress.  HENT:  Head: Normocephalic and atraumatic.  Mouth/Throat: Oropharynx is clear and moist. No oropharyngeal exudate.  Eyes: Conjunctivae and EOM are normal. Pupils are equal, round, and reactive to light. No scleral icterus.  Neck: Normal range of motion. No thyromegaly present.  Cardiovascular: Normal rate, regular rhythm, normal heart sounds and intact distal pulses.  No murmur heard. Pulmonary/Chest: Effort normal and breath sounds normal. No respiratory distress. She has no wheezes. She has no rales.  Abdominal: Soft. Bowel sounds are normal. She exhibits no  distension and no mass. There is no tenderness. There is no rebound and no guarding.  Musculoskeletal: Normal range of motion. She exhibits no edema.  Lymphadenopathy:    She has no cervical adenopathy.  Neurological: She is alert and oriented to person, place, and time. She has normal reflexes. No cranial nerve deficit. Coordination normal.  Skin: Skin is warm. No rash noted. She is not diaphoretic. No erythema. No pallor.     LABORATORY DATA: I have personally reviewed the data as listed: No visits with results within 1 Week(s) from this visit.  Latest known visit with results  is:  Appointment on 06/13/2017  Component Date Value Ref Range Status  . ANA Titer 1 06/13/2017 Negative   Final   Comment:                                                     Negative   <1:80                                                     Borderline  1:80                                                     Positive   >1:80   . dsDNA Ab 06/13/2017 1  0 - 9 IU/mL Final   Comment:                                                   Negative      <5                                                   Equivocal  5 - 9                                                   Positive      >9   . RA Latex Turbid. 06/13/2017 <10.0  0.0 - 13.9 IU/mL Final  . Total Protein 06/13/2017 7.6  6.0 - 8.5 g/dL Final  . Albumin 06/13/2017 3.9  2.9 - 4.4 g/dL Final  . Alpha 1 06/13/2017 0.2  0.0 - 0.4 g/dL Final  . Alpha 2 06/13/2017 0.6  0.4 - 1.0 g/dL Final  . Beta 06/13/2017 1.2  0.7 - 1.3 g/dL Final  . Gamma Globulin 06/13/2017 1.7  0.4 - 1.8 g/dL Final  . M-Spike, % 06/13/2017 Not Observed  Not Observed g/dL Final  . GLOBULIN, TOTAL 06/13/2017 3.7  2.2 - 3.9 g/dL Final  . A/G Ratio 06/13/2017 1.1  0.7 - 1.7 Final  . Please Note: 06/13/2017 Comment   Final   Comment: Protein electrophoresis scan will follow via computer, mail, or courier delivery.   . Interpretation(See Below) 06/13/2017 Comment   Final   Comment: The SPE  pattern appears essentially unremarkable. Evidence of monoclonal protein is not apparent.   . Protein S, Free 06/13/2017 92  57 - 157 % Final   Comment: This test was developed and its performance characteristics determined by [facility]. It has not been cleared or approved by the Food and Drug Administration.   . Protein S, Total 06/13/2017 96  60 - 150 %  Final   Comment: This test was developed and its performance characteristics determined by [facility]. It has not been cleared or approved by the Food and Drug Administration.        Ardath Sax, MD

## 2017-09-19 ENCOUNTER — Other Ambulatory Visit: Payer: Self-pay | Admitting: Internal Medicine

## 2017-09-19 DIAGNOSIS — Z139 Encounter for screening, unspecified: Secondary | ICD-10-CM

## 2017-10-18 ENCOUNTER — Ambulatory Visit
Admission: RE | Admit: 2017-10-18 | Discharge: 2017-10-18 | Disposition: A | Discharge status: Home / Self Care | Payer: BC Managed Care – PPO | Source: Ambulatory Visit | Attending: Internal Medicine | Admitting: Internal Medicine

## 2017-10-18 DIAGNOSIS — Z139 Encounter for screening, unspecified: Secondary | ICD-10-CM

## 2017-11-20 ENCOUNTER — Encounter: Payer: Self-pay | Admitting: Physician Assistant

## 2017-12-13 ENCOUNTER — Encounter: Payer: Self-pay | Admitting: Urgent Care

## 2017-12-13 ENCOUNTER — Ambulatory Visit: Payer: BC Managed Care – PPO | Admitting: Urgent Care

## 2017-12-13 VITALS — BP 124/80 | HR 99 | Temp 98.5°F | Resp 17 | Ht 63.0 in | Wt 179.0 lb

## 2017-12-13 DIAGNOSIS — M79644 Pain in right finger(s): Secondary | ICD-10-CM | POA: Diagnosis not present

## 2017-12-13 DIAGNOSIS — M65311 Trigger thumb, right thumb: Secondary | ICD-10-CM

## 2017-12-13 NOTE — Progress Notes (Signed)
    MRN: [id number] DOB: 1955-10-17  Subjective:   [name] is a 62 y.o. female presenting for recurrent trigger thumb of her right hand.  She has had this problem before, last episode was Dec 20, 2016.  I saw patient at the time with PA English.  Formed a successful steroid injection of her trigger thumb.  She would like to undergo the same treatment.  Denies fever, swelling, redness, trauma, falls.  Denies history of gout.   Laverta has a current medication list which includes the following prescription(s): asmanex hfa, biotin, cholecalciferol, diltiazem, fluticasone, fluticasone, gabapentin, losartan-hydrochlorothiazide, magnesium oxide, metformin, metoprolol succinate, montelukast, xarelto, xopenex hfa, and hydrochlorothiazide. Also is allergic to morphine and related.  Cyndie  has a past medical history of Acute bronchitis, Acute maxillary sinusitis, Allergic rhinitis, Dysmetabolic syndrome, Essential hypertension, malignant, Extrinsic asthma, unspecified, Fibroids, H/O: GI bleed, History of arteritis, Ischemic colitis, enteritis, or enterocolitis ([place]), Obesity, unspecified, Otitis media of both ears, Recurrent pulmonary emboli ([place]) (2012 and 2018), Sinusitis, Type II or unspecified type diabetes mellitus without mention of complication, not stated as uncontrolled, and Undifferentiated connective tissue disease ([place]). Also  has a past surgical history that includes Colonoscopy w/ biopsies; Abdominal hysterectomy (2000); Cardiac catheterization (09/25/05); and Breast excisional biopsy (Left, 1978).  Objective:   Vitals: BP 124/80   Pulse 99   Temp 98.5 F (36.9 C) (Oral)   Resp 17   Ht 5\' 3"  (1.6 m)   Wt 179 lb (81.2 kg)   SpO2 98%   BMI 31.71 kg/m   Physical Exam  Constitutional: She is oriented to person, place, and time. She appears well-developed and well-nourished.  Cardiovascular: Normal rate.  Pulmonary/Chest: Effort normal.  Musculoskeletal:       Hands: Neurological:  She is alert and oriented to person, place, and time.   PROCEDURE NOTE: steroid injection of trigger thumb Risks of procedure were discussed with patient. Patient verbalized understanding, verbal consent obtained. Alcohol prep pad was used to wipe base of right thumb over palmar surface. A retracted ball point pen was used to demarcate injection site over medial/ulnar aspect of base of thumb. A total of 3 iodine swabs were used for sterile prep. Superficial numbing was performed using ethyl chloride, followed by a swift wipe with an alcohol prep pad. A 25g needle was inserted into the injection site at an approximate 45 degree angle. A mixture of 2.51mL containing 20g of triamcinolone and 2% lidocaine was subsequently injected without incident. Cleansed, pressure dressing applied. After 5 minutes, patient's thumb range of motion in flexion, extension, abduction and adduction were present. Patient tolerated this procedure well.  Assessment and Plan :   Trigger thumb of right hand  Pain of right thumb  Successfully performed trigger thumb injection of her right hand.  Wound care reviewed.  Return to clinic precautions discussed.  Plan is to refer to an orthopedist practice for further management as this has been a recurring problem for patient.  Otherwise follow-up in 2 weeks.  Jaynee Eagles, PA-C [facility] [phone] 12/13/2017  9:09 AM

## 2017-12-13 NOTE — Patient Instructions (Addendum)
You can remove your dressing in 24 hours. Try to use your thumb still but do not over do it. If you develop redness, swelling, fever, red streaks along arm and forearm, then return to our clinic for a recheck.     IF you received an x-ray today, you will receive an invoice from [facility]. Please contact [facility] at [phone] with questions or concerns regarding your invoice.   IF you received labwork today, you will receive an invoice from [facility]. Please contact [facility] at [phone] with questions or concerns regarding your invoice.   Our billing staff will not be able to assist you with questions regarding bills from these companies.  You will be contacted with the lab results as soon as they are available. The fastest way to get your results is to activate your My Chart account. Instructions are located on the last page of this paperwork. If you have not heard from [place] regarding the results in 2 weeks, please contact this office.

## 2017-12-27 ENCOUNTER — Ambulatory Visit: Payer: BC Managed Care – PPO | Admitting: Urgent Care

## 2017-12-27 ENCOUNTER — Telehealth: Payer: Self-pay

## 2017-12-27 NOTE — Telephone Encounter (Signed)
Copied from [place] [phone]. Topic: Quick Communication - Appointment Cancellation >> Dec 27, 2017  9:03 AM [name] wrote: Patient called to cancel appointment scheduled for 12/27/17. Patient has rescheduled their appointment. Forgot about appt  Route to department's PEC pool.

## 2017-12-28 ENCOUNTER — Encounter: Payer: Self-pay | Admitting: Urgent Care

## 2017-12-28 ENCOUNTER — Ambulatory Visit: Payer: BC Managed Care – PPO | Admitting: Urgent Care

## 2017-12-28 ENCOUNTER — Other Ambulatory Visit: Payer: Self-pay

## 2017-12-28 VITALS — BP 122/80 | HR 89 | Temp 98.4°F | Resp 18 | Ht 63.0 in | Wt 178.6 lb

## 2017-12-28 DIAGNOSIS — M65311 Trigger thumb, right thumb: Secondary | ICD-10-CM

## 2017-12-28 DIAGNOSIS — M79644 Pain in right finger(s): Secondary | ICD-10-CM | POA: Diagnosis not present

## 2017-12-28 NOTE — Patient Instructions (Addendum)
Make sure you ice for 20 minutes after work days or on days that you are very active with your right hand. You may take 500mg  Tylenol every 6 hours for pain and inflammation.     Trigger Finger Trigger finger (stenosing tenosynovitis) is a condition that causes a finger to get stuck in a bent position. Each finger has a tough, cord-like tissue that connects muscle to bone (tendon), and each tendon is surrounded by a tunnel of tissue (tendon sheath). To move your finger, your tendon needs to slide freely through the sheath. Trigger finger happens when the tendon or the sheath thickens, making it difficult to move your finger. Trigger finger can affect any finger or a thumb. It may affect more than one finger. Mild cases may clear up with rest and medicine. Severe cases require more treatment. What are the causes? Trigger finger is caused by a thickened finger tendon or tendon sheath. The cause of this thickening is not known. What increases the risk? The following factors may make you more likely to develop this condition:  Doing activities that require a strong grip.  Having rheumatoid arthritis, gout, or diabetes.  Being 61-103 years old.  Being a woman.  What are the signs or symptoms? Symptoms of this condition include:  Pain when bending or straightening your finger.  Tenderness or swelling where your finger attaches to the palm of your hand.  A lump in the palm of your hand or on the inside of your finger.  Hearing a popping sound when you try to straighten your finger.  Feeling a popping, catching, or locking sensation when you try to straighten your finger.  Being unable to straighten your finger.  How is this diagnosed? This condition is diagnosed based on your symptoms and a physical exam. How is this treated? This condition may be treated by:  Resting your finger and avoiding activities that make symptoms worse.  Wearing a finger splint to keep your finger in a  slightly bent position.  Taking NSAIDs to relieve pain and swelling.  Injecting medicine (steroids) into the tendon sheath to reduce swelling and irritation. Injections may need to be repeated.  Having surgery to open the tendon sheath. This may be done if other treatments do not work and you cannot straighten your finger. You may need physical therapy after surgery.  Follow these instructions at home:  Use moist heat to help reduce pain and swelling as told by your health care provider.  Rest your finger and avoid activities that make pain worse. Return to normal activities as told by your health care provider.  If you have a splint, wear it as told by your health care provider.  Take over-the-counter and prescription medicines only as told by your health care provider.  Keep all follow-up visits as told by your health care provider. This is important. Contact a health care provider if:  Your symptoms are not improving with home care. Summary  Trigger finger (stenosing tenosynovitis) causes your finger to get stuck in a bent position, and it can make it difficult and painful to straighten your finger.  This condition develops when a finger tendon or tendon sheath thickens.  Treatment starts with resting, wearing a splint, and taking NSAIDs.  In severe cases, surgery to open the tendon sheath may be needed. This information is not intended to replace advice given to you by your health care provider. Make sure you discuss any questions you have with your health care provider. Document  Released: 05/19/2004 Document Revised: 07/10/2016 Document Reviewed: 07/10/2016 Elsevier Interactive Patient Education  2017 [employer].     IF you received an x-ray today, you will receive an invoice from [facility]. Please contact [facility] at [phone] with questions or concerns regarding your invoice.   IF you received labwork today, you will receive an invoice from  [facility]. Please contact [facility] at [phone] with questions or concerns regarding your invoice.   Our billing staff will not be able to assist you with questions regarding bills from these companies.  You will be contacted with the lab results as soon as they are available. The fastest way to get your results is to activate your My Chart account. Instructions are located on the last page of this paperwork. If you have not heard from [place] regarding the results in 2 weeks, please contact this office.

## 2017-12-28 NOTE — Progress Notes (Signed)
    MRN: [id number] DOB: 01-14-1956  Subjective:   [name] is a 62 y.o. female presenting for follow up on trigger finger of right thumb.  At her last office visit on 12/13/2017, patient had a steroid injection to resolve this.  She reports that in the following 2 days she had worsening symptoms including swelling and redness of her entire right side of her thumb including the base and part of her hand.  Thereafter, she reports dramatic improvement.  Today she states that she can bend her thumb without locking, has very minimal pain.  Her referral to Ortho is going to happen, has an office visit for 1 month from now.  She uses her hands continuously at work and notices that at the end of her day her thumb does start to bother her again.  She is on Xarelto and cannot take NSAIDs.  [name] has a current medication list which includes the following prescription(s): asmanex hfa, biotin, cholecalciferol, diltiazem, fluticasone, fluticasone, gabapentin, losartan-hydrochlorothiazide, magnesium oxide, metformin, metoprolol succinate, montelukast, xarelto, xopenex hfa, and hydrochlorothiazide. Also is allergic to morphine and related.  [name]  has a past medical history of Acute bronchitis, Acute maxillary sinusitis, Allergic rhinitis, Dysmetabolic syndrome, Essential hypertension, malignant, Extrinsic asthma, unspecified, Fibroids, H/O: GI bleed, History of arteritis, Ischemic colitis, enteritis, or enterocolitis ([place]), Obesity, unspecified, Otitis media of both ears, Recurrent pulmonary emboli ([place]) (2012 and 2018), Sinusitis, Type II or unspecified type diabetes mellitus without mention of complication, not stated as uncontrolled, and Undifferentiated connective tissue disease ([place]). Also  has a past surgical history that includes Colonoscopy w/ biopsies; Abdominal hysterectomy (2000); Cardiac catheterization (09/25/05); and Breast excisional biopsy (Left, 1978).  Objective:   Vitals: BP 122/80 (BP  Location: Left Arm, Patient Position: Sitting, Cuff Size: Normal)   Pulse 89   Temp 98.4 F (36.9 C) (Oral)   Resp 18   Ht 5\' 3"  (1.6 m)   Wt 178 lb 9.6 oz (81 kg)   SpO2 100%   BMI 31.64 kg/m   BP Readings from Last 3 Encounters:  12/28/17 122/80  12/13/17 124/80  06/27/17 136/76    Physical Exam  Constitutional: She is oriented to person, place, and time. She appears well-developed and well-nourished.  Cardiovascular: Normal rate.  Pulmonary/Chest: Effort normal.  Musculoskeletal:       Right hand: She exhibits normal range of motion, no tenderness, no bony tenderness, normal capillary refill, no deformity, no laceration and no swelling. Normal sensation noted. Normal strength noted.  Neurological: She is alert and oriented to person, place, and time.   Assessment and Plan :   Trigger thumb of right hand  Pain of right thumb  Patient is doing very well.  Recommended conservative management including icing after work and use of Tylenol.  She is to keep her appointment with Ortho.  Follow-up here as needed.  Jaynee Eagles, PA-C [facility] [phone] 12/28/2017 10:17 AM

## 2018-01-10 ENCOUNTER — Ambulatory Visit (INDEPENDENT_AMBULATORY_CARE_PROVIDER_SITE_OTHER): Payer: Self-pay

## 2018-01-10 ENCOUNTER — Ambulatory Visit (INDEPENDENT_AMBULATORY_CARE_PROVIDER_SITE_OTHER): Payer: BC Managed Care – PPO | Admitting: Orthopaedic Surgery

## 2018-01-10 ENCOUNTER — Encounter (INDEPENDENT_AMBULATORY_CARE_PROVIDER_SITE_OTHER): Payer: Self-pay | Admitting: Orthopaedic Surgery

## 2018-01-10 VITALS — BP 128/89 | HR 88 | Ht 63.0 in | Wt 175.0 lb

## 2018-01-10 DIAGNOSIS — M79641 Pain in right hand: Secondary | ICD-10-CM

## 2018-01-10 NOTE — Progress Notes (Signed)
Office Visit Note   Patient: [name]           Date of Birth: 21-Nov-1955           MRN: [id number] Visit Date: 01/10/2018              Requested by: Jaynee Eagles, PA-C [place], [place] [address] PCP: Rogers Blocker, MD   Assessment & Plan: Visit Diagnoses:  1. Pain in right hand     Plan: Right trigger thumb.  Will apply splint. discussed different treatment options including occasional cortisone injection, and surgery.  Mrs. Delbridge would like to wait to consider the surgery at a later date.  Follow-Up Instructions: Return if symptoms worsen or fail to improve.   Orders:  Orders Placed This Encounter  Procedures  . XR Hand Complete Right   No orders of the defined types were placed in this encounter.     Procedures: No procedures performed   Clinical Data: No additional findings.   Subjective: Chief Complaint  Patient presents with  . Right Hand - Pain  . New Patient (Initial Visit)    right thumb trigger finger started last year  History of right thumb triggering for well over a year.  Has had 2 cortisone injection last several weeks still having some achiness and soreness but no longer triggering.  Past history is significant that Mrs. Glendenning has had pulmonary emboli x2 and presently is on Xarelto 20 mg a day.  She has been told by her hematologist that she cannot stop the medicine for any reason.  Therefore she would like to avoid any surgery if possible  HPI  Review of Systems  Constitutional: Negative for fatigue and fever.  HENT: Negative for ear pain.   Eyes: Negative for pain.  Respiratory: Positive for shortness of breath.   Cardiovascular: Positive for leg swelling.  Gastrointestinal: Negative for constipation and diarrhea.  Genitourinary: Negative for difficulty urinating.  Musculoskeletal: Negative for back pain and neck pain.  Skin: Positive for rash.  Allergic/Immunologic: Negative for food allergies.  Neurological: Negative for  weakness and numbness.  Hematological: Bruises/bleeds easily.  Psychiatric/Behavioral: Negative for sleep disturbance.     Objective: Vital Signs: BP 128/89 (BP Location: Left Arm, Patient Position: Sitting, Cuff Size: Normal)   Pulse 88   Ht 5\' 3"  (1.6 m)   Wt 175 lb (79.4 kg)   BMI 31.00 kg/m   Physical Exam  Constitutional: She is oriented to person, place, and time. She appears well-developed and well-nourished.  HENT:  Mouth/Throat: Oropharynx is clear and moist.  Eyes: Pupils are equal, round, and reactive to light. EOM are normal.  Pulmonary/Chest: Effort normal.  Neurological: She is alert and oriented to person, place, and time.  Skin: Skin is warm and dry.  Psychiatric: She has a normal mood and affect. Her behavior is normal.    Ortho Exam alert and oriented x3 comfortable sitting.  No shortness of breath or chest pain.  Examination of the right thumb there was no active triggering.  There was a small minimally painful nodule on the palmar aspect of the thumb at the metacarpal phalangeal joint.  No swelling of the finger.  Neurovascular exam intact  Specialty Comments:  No specialty comments available.  Imaging: Xr Hand Complete Right  Result Date: 01/10/2018 Films of the right hand were obtained in several projections.  Patient is experiencing triggering of her right thumb.  I did not see any abnormality about the IP  joint of the right thumb.  There are accessary ossicles on the palmar surface of the metacarpal phalangeal joint .  Joint spaces are well-maintained.  No obvious degenerative change at the base of the thumb.    PMFS History: Patient Active Problem List   Diagnosis Date Noted  . Recurrent pulmonary emboli ([place]) 05/30/2017  . Elevated troponin 01/26/2017  . DOE (dyspnea on exertion) 06/28/2011  . Constipation, acute 06/26/2011  . HTN (hypertension) 06/25/2011  . Type 2 diabetes mellitus with complication, without long-term current use of insulin  ([place]) 06/25/2011  . Connective tissue disorder ([place]) 06/25/2011   Past Medical History:  Diagnosis Date  . Acute bronchitis   . Acute maxillary sinusitis   . Allergic rhinitis   . Dysmetabolic syndrome    History of  . Essential hypertension, malignant   . Extrinsic asthma, unspecified   . Fibroids   . H/O: GI bleed   . History of arteritis   . Ischemic colitis, enteritis, or enterocolitis ([place])    Hisotry of  . Obesity, unspecified   . Otitis media of both ears   . Recurrent pulmonary emboli ([place]) 2012 and 2018   Saddle embolism 2018  . Sinusitis   . Type II or unspecified type diabetes mellitus without mention of complication, not stated as uncontrolled    oral meds-insulin resistance  . Undifferentiated connective tissue disease ([place])    History of    Family History  Problem Relation Age of Onset  . Kidney disease Mother   . Congestive Heart Failure Mother   . DES usage Mother   . Kidney disease Father   . Hypertension Father   . Acute myelogenous leukemia Sister        APL  . Sickle cell trait Daughter   . Breast cancer Cousin     Past Surgical History:  Procedure Laterality Date  . ABDOMINAL HYSTERECTOMY  2000  . BREAST EXCISIONAL BIOPSY Left 1978  . CARDIAC CATHETERIZATION  09/25/05  . COLONOSCOPY W/ BIOPSIES     Social History   Occupational History  . Not on file  Tobacco Use  . Smoking status: Never Smoker  . Smokeless tobacco: Never Used  . Tobacco comment: Patient reports extensive passive tobacco smoke exposure  Substance and Sexual Activity  . Alcohol use: No  . Drug use: No  . Sexual activity: Never

## 2018-03-11 NOTE — Progress Notes (Signed)
Cardiology Office Note:    Date:  03/13/2018   ID:  [name], DOB [birth year], MRN [id number]  PCP:  Rogers Blocker, MD  Cardiologist:  No primary care provider on file.   Referring MD: Rogers Blocker, MD   Chief Complaint  Patient presents with  . Shortness of Breath    History of Present Illness:    [name] is a 62 y.o. female with a hx   Recurrent PE x 2 most recently saddle embolus June 2018, essential hypertension with LVH, connective tissue disease (question specific diagnosis), and chronic dyspnea.  [name] is doing well.  She complains her legs hurt, she has a hoarse voice, has dyspnea on exertion.  Dyspnea is unchanged from previous.  She denies chest pain.  She denies lower extremity swelling.  Past Medical History:  Diagnosis Date  . Acute bronchitis   . Acute maxillary sinusitis   . Allergic rhinitis   . Dysmetabolic syndrome    History of  . Essential hypertension, malignant   . Extrinsic asthma, unspecified   . Fibroids   . H/O: GI bleed   . History of arteritis   . Ischemic colitis, enteritis, or enterocolitis ([name])    Hisotry of  . Obesity, unspecified   . Otitis media of both ears   . Recurrent pulmonary emboli ([name]) 2012 and 2018   Saddle embolism 2018  . Sinusitis   . Type II or unspecified type diabetes mellitus without mention of complication, not stated as uncontrolled    oral meds-insulin resistance  . Undifferentiated connective tissue disease ([name])    History of    Past Surgical History:  Procedure Laterality Date  . ABDOMINAL HYSTERECTOMY  2000  . BREAST EXCISIONAL BIOPSY Left 1978  . CARDIAC CATHETERIZATION  09/25/05  . COLONOSCOPY W/ BIOPSIES      Current Medications: Current Meds  Medication Sig  . ASMANEX HFA 100 MCG/ACT AERO Inhale 2 puffs into the lungs 2 (two) times daily.  Marland Kitchen BIOTIN 5000 PO Take 5,000 mg by mouth at bedtime.  . cholecalciferol (VITAMIN D) 1000 UNITS tablet Take 1,000 Units by mouth daily.  Marland Kitchen diltiazem  (CARDIZEM CD) 360 MG 24 hr capsule Take 360 mg by mouth daily.  . fluticasone (FLONASE) 50 MCG/ACT nasal spray Place 2 sprays into both nostrils daily as needed for allergies or rhinitis.  . fluticasone (FLOVENT HFA) 44 MCG/ACT inhaler Inhale 2 puffs into the lungs 2 (two) times daily as needed (allergies).  . gabapentin (NEURONTIN) 300 MG capsule Take 300 mg by mouth at bedtime.   . hydrochlorothiazide (MICROZIDE) 12.5 MG capsule Take 12.5 mg by mouth daily.  Marland Kitchen losartan-hydrochlorothiazide (HYZAAR) 100-12.5 MG tablet Take 1 tablet by mouth daily.  . magnesium oxide (MAG-OX) 400 MG tablet Take 1 tablet by mouth 2 (two) times daily.  . metFORMIN (GLUCOPHAGE) 500 MG tablet Take 500 mg by mouth every evening.   . metoprolol succinate (TOPROL-XL) 25 MG 24 hr tablet Take 0.5 tablets (12.5 mg total) by mouth daily.  . montelukast (SINGULAIR) 10 MG tablet Take 10 mg by mouth daily.  Alveda Reasons 20 MG TABS tablet Take 20 mg by mouth daily.  Penne Lash HFA 45 MCG/ACT inhaler Inhale 1-2 puffs into the lungs every 6 (six) hours as needed for wheezing. Reported on 08/08/2015     Allergies:   Morphine and related   Social History   Socioeconomic History  . Marital status: Widowed    Spouse name: Not on file  .  Number of children: Not on file  . Years of education: Not on file  . Highest education level: Not on file  Occupational History  . Not on file  Social Needs  . Financial resource strain: Not on file  . Food insecurity:    Worry: Not on file    Inability: Not on file  . Transportation needs:    Medical: Not on file    Non-medical: Not on file  Tobacco Use  . Smoking status: Never Smoker  . Smokeless tobacco: Never Used  . Tobacco comment: Patient reports extensive passive tobacco smoke exposure  Substance and Sexual Activity  . Alcohol use: No  . Drug use: No  . Sexual activity: Never  Lifestyle  . Physical activity:    Days per week: Not on file    Minutes per session: Not on file   . Stress: Not on file  Relationships  . Social connections:    Talks on phone: Not on file    Gets together: Not on file    Attends religious service: Not on file    Active member of club or organization: Not on file    Attends meetings of clubs or organizations: Not on file    Relationship status: Not on file  Other Topics Concern  . Not on file  Social History Narrative  . Not on file     Family History: The patient's family history includes Acute myelogenous leukemia in her sister; Breast cancer in her cousin; Congestive Heart Failure in her mother; DES usage in her mother; Hypertension in her father; Kidney disease in her father and mother; Sickle cell trait in her daughter.  ROS:   Please see the history of present illness.    Snoring, wheezing, leg pain, cough, chest pressure.  All other systems reviewed and are negative.  EKGs/Labs/Other Studies Reviewed:    The following studies were reviewed today: No additional data is available for review.  EKG:  EKG is  ordered today.  The ekg ordered today demonstrates sinus rhythm at 91 bpm, and overall normal appearance.  Recent Labs: 05/10/2017: NT-Pro BNP 10 05/30/2017: ALT 15; BUN 10.9; Creatinine 1.1; HGB 13.7; Platelets 359; Potassium 4.1; Sodium 140  Recent Lipid Panel    Component Value Date/Time   CHOL 153 01/27/2017 0856   TRIG 86 01/27/2017 0856   HDL 60 01/27/2017 0856   CHOLHDL 2.6 01/27/2017 0856   VLDL 17 01/27/2017 0856   LDLCALC 76 01/27/2017 0856    Physical Exam:    VS:  BP 132/84   Pulse 91   Ht 5\' 3"  (1.6 m)   Wt 181 lb 3.2 oz (82.2 kg)   BMI 32.10 kg/m     Wt Readings from Last 3 Encounters:  03/13/18 181 lb 3.2 oz (82.2 kg)  01/10/18 175 lb (79.4 kg)  12/28/17 178 lb 9.6 oz (81 kg)     GEN:  Well nourished, well developed in no acute distress HEENT: Normal NECK: No JVD. LYMPHATICS: No lymphadenopathy CARDIAC: RRR, no murmur, no gallop, no edema. VASCULAR: 2+ radial pulses.  No  bruits. RESPIRATORY:  Clear to auscultation without rales, wheezing or rhonchi  ABDOMEN: Soft, non-tender, non-distended, No pulsatile mass, MUSCULOSKELETAL: No deformity  SKIN: Warm and dry NEUROLOGIC:  Alert and oriented x 3 PSYCHIATRIC:  Normal affect   ASSESSMENT:    1. DOE (dyspnea on exertion)   2. Bilateral pulmonary embolism ([place])   3. Mixed connective tissue disease ([place])   4. Essential  hypertension   5. Hypertensive left ventricular hypertrophy, without heart failure   6. Type 2 diabetes mellitus with complication, without long-term current use of insulin ([facility])   7. Protein S deficiency ([place])    PLAN:    In order of problems listed above:  1. Persistent but not greatly changed from 6 months ago.  Low-dose diuretic is better control blood pressure but not changed dyspnea. 2. Lifelong Xarelto therapy is required 3. Needs to follow-up with other members of the treating team 4. Good blood pressure control.  Target 130/80 mmHg.   PRN follow-up with me as needed.  Call if chest discomfort, peripheral edema, palpitations, or syncope.   Medication Adjustments/Labs and Tests Ordered: Current medicines are reviewed at length with the patient today.  Concerns regarding medicines are outlined above.  Orders Placed This Encounter  Procedures  . EKG 12-Lead   No orders of the defined types were placed in this encounter.   There are no Patient Instructions on file for this visit.   Signed, Sinclair Grooms, MD  03/13/2018 9:50 AM    [place]

## 2018-03-13 ENCOUNTER — Encounter

## 2018-03-13 ENCOUNTER — Ambulatory Visit: Payer: BC Managed Care – PPO | Admitting: Interventional Cardiology

## 2018-03-13 ENCOUNTER — Encounter: Payer: Self-pay | Admitting: Interventional Cardiology

## 2018-03-13 VITALS — BP 132/84 | HR 91 | Ht 63.0 in | Wt 181.2 lb

## 2018-03-13 DIAGNOSIS — I10 Essential (primary) hypertension: Secondary | ICD-10-CM | POA: Diagnosis not present

## 2018-03-13 DIAGNOSIS — I2699 Other pulmonary embolism without acute cor pulmonale: Secondary | ICD-10-CM

## 2018-03-13 DIAGNOSIS — D6859 Other primary thrombophilia: Secondary | ICD-10-CM

## 2018-03-13 DIAGNOSIS — R0609 Other forms of dyspnea: Secondary | ICD-10-CM

## 2018-03-13 DIAGNOSIS — E118 Type 2 diabetes mellitus with unspecified complications: Secondary | ICD-10-CM

## 2018-03-13 DIAGNOSIS — I119 Hypertensive heart disease without heart failure: Secondary | ICD-10-CM

## 2018-03-13 DIAGNOSIS — M351 Other overlap syndromes: Secondary | ICD-10-CM

## 2018-03-13 NOTE — Patient Instructions (Signed)
Medication Instructions:  Your physician recommends that you continue on your current medications as directed. Please refer to the Current Medication list given to you today.  Labwork: None  Testing/Procedures: None  Follow-Up: Your physician recommends that you schedule a follow-up appointment as needed with Dr. Smith.     Any Other Special Instructions Will Be Listed Below (If Applicable).     If you need a refill on your cardiac medications before your next appointment, please call your pharmacy.   

## 2018-03-19 ENCOUNTER — Encounter (HOSPITAL_BASED_OUTPATIENT_CLINIC_OR_DEPARTMENT_OTHER): Payer: Self-pay

## 2018-03-19 DIAGNOSIS — G4733 Obstructive sleep apnea (adult) (pediatric): Secondary | ICD-10-CM

## 2018-04-13 ENCOUNTER — Ambulatory Visit (HOSPITAL_BASED_OUTPATIENT_CLINIC_OR_DEPARTMENT_OTHER): Payer: BC Managed Care – PPO | Attending: Internal Medicine | Admitting: Internal Medicine

## 2018-04-13 VITALS — Ht 63.0 in | Wt 180.0 lb

## 2018-04-13 DIAGNOSIS — G4733 Obstructive sleep apnea (adult) (pediatric): Secondary | ICD-10-CM | POA: Diagnosis not present

## 2018-04-13 DIAGNOSIS — R0681 Apnea, not elsewhere classified: Secondary | ICD-10-CM | POA: Diagnosis present

## 2018-04-20 DIAGNOSIS — G4733 Obstructive sleep apnea (adult) (pediatric): Secondary | ICD-10-CM

## 2018-04-20 NOTE — Procedures (Signed)
Patient Name: [name], [name] Date: 04/13/2018 Gender: Female D.O.B: Dec 15, 1955 Age (years): 62 Referring Provider: Kevan Ny Height (inches): 92 Interpreting Physician: Baird Lyons MD, ABSM Weight (lbs): 180 RPSGT: Jorge Ny BMI: 32 MRN: [id number] Neck Size: 15.00  CLINICAL INFORMATION Sleep Study Type: NPSG Indication for sleep study: Hypertension, Obesity, OSA, Snoring  Epworth Sleepiness Score: 11  SLEEP STUDY TECHNIQUE As per the AASM Manual for the Scoring of Sleep and Associated Events v2.3 (April 2016) with a hypopnea requiring 4% desaturations.  The channels recorded and monitored were frontal, central and occipital EEG, electrooculogram (EOG), submentalis EMG (chin), nasal and oral airflow, thoracic and abdominal wall motion, anterior tibialis EMG, snore microphone, electrocardiogram, and pulse oximetry.  MEDICATIONS Medications self-administered by patient taken the night of the study : VITAMIN D, GABAPENTIN, MAGNESIUM OXIDE, METFORMIN, XARELTO, CYCLOBENZAPRINE  SLEEP ARCHITECTURE The study was initiated at 10:51:45 PM and ended at 5:01:43 AM.  Sleep onset time was 3.1 minutes and the sleep efficiency was 95.8%%. The total sleep time was 354.5 minutes.  Stage REM latency was 93.5 minutes.  The patient spent 3.2%% of the night in stage N1 sleep, 84.6%% in stage N2 sleep, 0.0%% in stage N3 and 12.1% in REM.  Alpha intrusion was absent.  Supine sleep was 100.00%.  RESPIRATORY PARAMETERS The overall apnea/hypopnea index (AHI) was 6.1 per hour. There were 14 total apneas, including 13 obstructive, 0 central and 1 mixed apneas. There were 22 hypopneas and 30 RERAs.  The AHI during Stage REM sleep was 36.3 per hour.  AHI while supine was 6.1 per hour.  The mean oxygen saturation was 95.1%. The minimum SpO2 during sleep was 82.0%.  moderate snoring was noted during this study.  CARDIAC DATA The 2 lead EKG demonstrated sinus rhythm. The mean heart  rate was 75.3 beats per minute. Other EKG findings include: None.  LEG MOVEMENT DATA The total PLMS were 0 with a resulting PLMS index of 0.0. Associated arousal with leg movement index was 0.0 .  IMPRESSIONS - Mild obstructive sleep apnea occurred during this study (AHI = 6.1/h). - Insufficent early events and sleep to meet protocol requirements for split CPAP titration. - No significant central sleep apnea occurred during this study (CAI = 0.0/h). - Mild oxygen desaturation was noted during this study (Min O2 = 82.0%). - The patient snored with moderate snoring volume. - No cardiac abnormalities were noted during this study. - Clinically significant periodic limb movements did not occur during sleep. No significant associated arousals.  DIAGNOSIS - Obstructive Sleep Apnea (327.23 [G47.33 ICD-10])  RECOMMENDATIONS - Treatment for very mild OSA is directed at symptoms. Conservative measures might include weight loss, sleep position off back. Other options such as CPAP or a fitted oral appliance would be based on clinical judgment. - Positional therapy avoiding supine position during sleep. - Be careful with alcohol, sedatives and other CNS depressants that may worsen sleep apnea and disrupt normal sleep architecture. - Sleep hygiene should be reviewed to assess factors that may improve sleep quality. - Weight management and regular exercise should be initiated or continued if appropriate.  [Electronically signed] 04/20/2018 10:59 AM  Baird Lyons MD, ABSM Diplomate, American Board of Sleep Medicine   NPI: [id number]                           [place], [place] [facility]  ELECTRONICALLY SIGNED ON:  04/20/2018, 10:56 AM [place] PH: [phone]   FX: [phone] ACCREDITED BY THE AMERICAN ACADEMY OF SLEEP MEDICINE

## 2018-04-26 ENCOUNTER — Other Ambulatory Visit: Payer: Self-pay | Admitting: Interventional Cardiology

## 2018-05-06 ENCOUNTER — Telehealth: Payer: Self-pay | Admitting: Hematology and Oncology

## 2018-05-06 NOTE — Telephone Encounter (Signed)
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 Spoke with patient re f/u 11/19.

## 2018-06-17 ENCOUNTER — Telehealth: Payer: Self-pay | Admitting: Hematology and Oncology

## 2018-06-17 NOTE — Telephone Encounter (Signed)
Ruben call day 11/19 moved appointments for 11/2o. Left message. Schedule mailed.

## 2018-06-30 ENCOUNTER — Ambulatory Visit: Payer: BC Managed Care – PPO | Admitting: Hematology and Oncology

## 2018-07-01 ENCOUNTER — Ambulatory Visit: Payer: BC Managed Care – PPO | Admitting: Hematology and Oncology

## 2018-07-02 ENCOUNTER — Inpatient Hospital Stay: Payer: BC Managed Care – PPO | Attending: Hematology and Oncology | Admitting: Hematology and Oncology

## 2018-07-02 ENCOUNTER — Telehealth: Payer: Self-pay | Admitting: Hematology and Oncology

## 2018-07-02 VITALS — BP 132/68 | HR 87 | Temp 98.5°F | Resp 18 | Ht 63.0 in | Wt 190.5 lb

## 2018-07-02 DIAGNOSIS — I10 Essential (primary) hypertension: Secondary | ICD-10-CM

## 2018-07-02 DIAGNOSIS — Z90710 Acquired absence of both cervix and uterus: Secondary | ICD-10-CM | POA: Diagnosis not present

## 2018-07-02 DIAGNOSIS — Z7984 Long term (current) use of oral hypoglycemic drugs: Secondary | ICD-10-CM

## 2018-07-02 DIAGNOSIS — Z79899 Other long term (current) drug therapy: Secondary | ICD-10-CM | POA: Diagnosis not present

## 2018-07-02 DIAGNOSIS — I2699 Other pulmonary embolism without acute cor pulmonale: Secondary | ICD-10-CM | POA: Diagnosis not present

## 2018-07-02 DIAGNOSIS — E119 Type 2 diabetes mellitus without complications: Secondary | ICD-10-CM | POA: Insufficient documentation

## 2018-07-02 DIAGNOSIS — Z7901 Long term (current) use of anticoagulants: Secondary | ICD-10-CM | POA: Diagnosis not present

## 2018-07-02 NOTE — Telephone Encounter (Signed)
Gave pt avs and calendar  °

## 2018-07-02 NOTE — Progress Notes (Signed)
Hematology/Oncology Outpatient Progress Note  Patient Name:  [name] DOB: 05/12/1956  Date of Service: July 02, 2018  Referring Provider: Kevan Ny, MD  Consulting Physician: Henreitta Leber, MD Hematology/Oncology  Assessment: Recurrent pulmonary emboli [facility]) 62 y.o. 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 Initial thrombotic event was extensively evaluated including a thrombophilia panel, which unfortunately did not include a prothrombin gene mutation. 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  Additional testing reveals no evidence of paraproteinemia, protein S deficiency, or evidence of lupus based on negative ANA, double-stranded DNA antibodies, and negative rheumatoid factor.  No specific provoking factors were identified. With that in mind, indefinite anticoagulation remains our recommendation.  Plan: --Recommend indefinite anticoagulation, rivaroxaban (Xarelto) appears to be effective and well-tolerated. --Patient is due for colonoscopy which needs to be obtained any time now. Patient will need to be bridged with Lovenox for the procedure --rivaroxaban can be stopped 48 hours prior to the planned procedure, starting enoxaparin 1 mg/kg twice daily.  And ask apparent will be held the night before the procedure in the morning of the procedure.  Rivaroxaban can be resumed at the discretion of gastroenterology. --Return to my clinic in 1 year for clinical monitoring  Brief History: [name] is a 62 y.o. female followed in the [facility] for evaluation and management recommendations regarding her recurrent unprovoked venous thromboembolic phenomena. Please see  hematological history below for details.   At the present time, patient is taking her prescribed her rivaroxaban (Xarelto). She continues to have chronic dyspnea on exertion with previous evaluations negative for pulmonary hypertension, chronic thromboembolism. She continues to have dry cough, wheezing, and pleuritic chest pain. She also complains of hoarse voice. Interestingly, she has lost sensation of taste following her last pulmonary embolism. She also complains of left mandibular pain with previous history of TMJ surgery with wire placement..  Oncological/hematological History: --Event #1, 06/21/11: Presented with 4 weeks of difficult time breathing associated with sharp pleuritic pain in her midsternal area that radiates "straight through to my back, between my shoulder blades." Pain was 7/10, and improved down to 5/10 with ASA. Pain was exacerbated when lying down and relieved when sitting up. At the onset of the SOB, patient had an Urgent Care visit with a slight fever, entire body chills. Subsequently, she started becoming more easily fatigued, frustrated in addition to having intermittent insomnia due to her breathing pain. There was no preceding period of immobilization, trauma, or surgery.             --CTA Chest, 06/21/11: Multiple bilateral segmental pulmonary emboli.             --Doppler US [facility] LE, 06/22/11: No evidence of deep vein thrombosis.             --Thrombophilia Panel, 2012: Negative for antiphospholipid antibody syndrome, normal values for protein C, protein S, and antithrombin III. Negative for factor V Leiden mutation             --Treatment: Warfarin x3 yrs  --CTA Chest, 12/10/11: No evidence of pulmonary emboli or mediastinal/hilar lymphadenopathy.  --Event #2, 01/25/17: Presented to the ED for evaluation of 2-week shortness of breath with exertion and pleuritic chest pain. No light-headedness or dizziness. No near syncope.  once again, no distinct provoking  factors identified such as immobility, trauma, or surgery.             --CTA Chest, 01/25/17: Bilateral lobar branches pulmonary emboli with significant clot burden and evidence of right ventricular strain.             --Doppler US BL LE, 01/26/17: No evidence of deep vein thrombosis.             --Treatment: Rivaroxaban  --Labs, 05/30/17: Thrombophilia panel is positive for DRVVT -- DRVVT 109.5, DRVVT Mix 86.9, DRVVT Confirm 1.4 Suggesting presence of lupus anticoagulant, no other significant abnormalities.  Past Medical History:  Diagnosis Date  . Acute bronchitis   . Acute maxillary sinusitis   . Allergic rhinitis   . Dysmetabolic syndrome    History of  . Essential hypertension, malignant   . Extrinsic asthma, unspecified   . Fibroids   . H/O: GI bleed   . History of arteritis   . Ischemic colitis, enteritis, or enterocolitis ([place])    Hisotry of  . Obesity, unspecified   . Otitis media of both ears   . Recurrent pulmonary emboli ([place]) 2012 and 2018   Saddle embolism 2018  . Sinusitis   . Type II or unspecified type diabetes mellitus without mention of complication, not stated as uncontrolled    oral meds-insulin resistance  . Undifferentiated connective tissue disease ([place])    History of  Pneumonia Past Surgical History:  Procedure Laterality Date  . ABDOMINAL HYSTERECTOMY  2000  . BREAST EXCISIONAL BIOPSY Left 1978  . CARDIAC CATHETERIZATION  09/25/05  . COLONOSCOPY W/ BIOPSIES     Family History  Problem Relation Age of Onset  . Kidney disease Mother   . Congestive Heart Failure Mother   . DES usage Mother   . Kidney disease Father   . Hypertension Father   . Acute myelogenous leukemia Sister        APL  . Sickle cell trait Daughter   . Breast cancer Cousin    Social History   Socioeconomic History  . Marital status: Widowed    Spouse name: Not on file  . Number of children: Not on file  . Years of education: Not on file  . Highest education level: Not on  file  Occupational History  . Not on file  Social Needs  . Financial resource strain: Not on file  . Food insecurity:    Worry: Not on file    Inability: Not on file  . Transportation needs:    Medical: Not on file    Non-medical: Not on file  Tobacco Use  . Smoking status: Never Smoker  . Smokeless tobacco: Never Used  . Tobacco comment: Patient reports extensive passive tobacco smoke exposure  Substance and Sexual Activity  . Alcohol use: No  . Drug use: No  . Sexual activity: Never  Lifestyle  . Physical activity:    Days per week: Not on file    Minutes per session: Not on file  . Stress: Not on file  Relationships  . Social connections:    Talks on phone: Not on file    Gets together: Not on file    Attends religious service: Not on file    Active member of club or organization: Not on file    Attends meetings of clubs or organizations: Not on file    Relationship status: Not on file  . Intimate partner violence:    Fear of  current or ex partner: Not on file    Emotionally abused: Not on file    Physically abused: Not on file    Forced sexual activity: Not on file  Other Topics Concern  . Not on file  Social History Narrative  . Not on file   Allergies  Allergen Reactions  . Morphine And Related     Generalized itching   Current Outpatient Medications  Medication Sig Dispense Refill  . ASMANEX HFA 100 MCG/ACT AERO Inhale 2 puffs into the lungs 2 (two) times daily.  3  . BIOTIN 5000 PO Take 5,000 mg by mouth at bedtime.    . cholecalciferol (VITAMIN D) 1000 UNITS tablet Take 1,000 Units by mouth daily.    Marland Kitchen diltiazem (CARDIZEM CD) 360 MG 24 hr capsule Take 360 mg by mouth daily.    . fluticasone (FLONASE) 50 MCG/ACT nasal spray Place 2 sprays into both nostrils daily as needed for allergies or rhinitis.    . fluticasone (FLOVENT HFA) 44 MCG/ACT inhaler Inhale 2 puffs into the lungs 2 (two) times daily as needed (allergies).    . gabapentin (NEURONTIN) 300 MG  capsule Take 300 mg by mouth at bedtime.     . hydrochlorothiazide (MICROZIDE) 12.5 MG capsule TAKE 1 CAPSULE BY MOUTH EVERY DAY 90 capsule 3  . losartan-hydrochlorothiazide (HYZAAR) 100-12.5 MG tablet Take 1 tablet by mouth daily. 90 tablet 3  . magnesium oxide (MAG-OX) 400 MG tablet Take 1 tablet by mouth 2 (two) times daily.  3  . metFORMIN (GLUCOPHAGE) 500 MG tablet Take 500 mg by mouth every evening.     . metoprolol succinate (TOPROL-XL) 25 MG 24 hr tablet TAKE 0.5 TABLETS (12.5 MG TOTAL) BY MOUTH DAILY. 45 tablet 3  . montelukast (SINGULAIR) 10 MG tablet Take 10 mg by mouth daily.  1  . XARELTO 20 MG TABS tablet Take 20 mg by mouth daily.  3  . XOPENEX HFA 45 MCG/ACT inhaler Inhale 1-2 puffs into the lungs every 6 (six) hours as needed for wheezing. Reported on 08/08/2015     No current facility-administered medications for this visit.    Review of Systems  HENT:   Positive for voice change.   Respiratory: Positive for cough, shortness of breath and wheezing. Negative for hemoptysis.   Cardiovascular: Positive for chest pain.  Neurological: Speech difficulty:  -  All other systems reviewed and are negative.  PHYSICAL EXAMINATION Blood pressure 132/68, pulse 87, temperature 98.5 F (36.9 C), temperature source Oral, resp. rate 18, height 5\' 3"  (1.6 m), weight 190 lb 8 oz (86.4 kg), SpO2 100 %.  ECOG PERFORMANCE STATUS: 1 - Symptomatic but completely ambulatory Physical Exam  Constitutional: She is oriented to person, place, and time and well-developed, well-nourished, and in no distress. No distress.  HENT:  Head: Normocephalic and atraumatic.  Mouth/Throat: Oropharynx is clear and moist. No oropharyngeal exudate.  Eyes: Pupils are equal, round, and reactive to light. Conjunctivae and EOM are normal. No scleral icterus.  Neck: Normal range of motion. No thyromegaly present.  Cardiovascular: Normal rate, regular rhythm, normal heart sounds and intact distal pulses.  No murmur  heard. Pulmonary/Chest: Effort normal and breath sounds normal. No respiratory distress. She has no wheezes. She has no rales.  Abdominal: Soft. Bowel sounds are normal. She exhibits no distension and no mass. There is no tenderness. There is no rebound and no guarding.  Musculoskeletal: Normal range of motion. She exhibits no edema.  Lymphadenopathy:    She has  no cervical adenopathy.  Neurological: She is alert and oriented to person, place, and time. She has normal reflexes. No cranial nerve deficit. Coordination normal.  Skin: Skin is warm. No rash noted. She is not diaphoretic. No erythema. No pallor.   The total time spent discussing her prior history, significance of a lupus anticoagulant, and the rationale for continuing anticoagulation in the setting of 2 independent pulmonary emboli as outlined above was 25 minutes.  Because of her post thrombotic sequelae, she is not interested in repeating her lupus anticoagulant since she will continue anticoagulation indefinitely regardless of the outcome. At least 50% of that time was spent in face to face discussion, counseling, and answering questions. There was ample time allotted to answer all questions.  This note was dictated using voice activated technology/software.  Unfortunately, typographical errors are not uncommon, and transcription is subject to mistakes and regrettably misinterpretation.  If necessary, clarification of the above information can be discussed with me at any time.  FOLLOW UP: AS DIRECTED   cc:     Kevan Ny, MD   Henreitta Leber, MD  Hematology/Oncology [facility] [address] [place], [place] [address] Office: [phone] MBPJ: [phone]

## 2018-07-02 NOTE — Patient Instructions (Signed)
We discussed in detail your previous laboratory studies and hypercoagulable evaluation.  Continue on Xarelto as previously recommended.  Support stockings do not prevent blood clots.  They may improve circulation make her legs feel better.  A brand that I like is Juzo.  If you are happy with your support stockings and the existing compression/pressure, continue with them.  They need to be changed every 6 months at least.  We discussed also the implications of a lupus anticoagulant.  This is a predisposing factor to venous thromboembolic events.  It is always advisable to exercise in moderation, avoid prolonged periods of standing and sitting, and maintain your ideal weight for you.  Barring any unforeseen complications, your next scheduled doctor visit with Dr. Benay Spice is in 1 year.  Please do not hesitate to call in the interim should any new or untoward problems arise.  Thank you! Happy happy Thanksgiving All the very best you.  Ladona Ridgel, MD Hematology/

## 2018-09-25 ENCOUNTER — Other Ambulatory Visit: Payer: Self-pay | Admitting: Internal Medicine

## 2018-09-25 DIAGNOSIS — Z1231 Encounter for screening mammogram for malignant neoplasm of breast: Secondary | ICD-10-CM

## 2018-09-27 ENCOUNTER — Ambulatory Visit
Admission: EM | Admit: 2018-09-27 | Discharge: 2018-09-27 | Disposition: A | Discharge status: Home / Self Care | Payer: BC Managed Care – PPO | Attending: Family Medicine | Admitting: Family Medicine

## 2018-09-27 ENCOUNTER — Other Ambulatory Visit: Payer: Self-pay

## 2018-09-27 ENCOUNTER — Encounter: Payer: Self-pay | Admitting: Family Medicine

## 2018-09-27 DIAGNOSIS — N3000 Acute cystitis without hematuria: Secondary | ICD-10-CM | POA: Insufficient documentation

## 2018-09-27 DIAGNOSIS — M25551 Pain in right hip: Secondary | ICD-10-CM | POA: Diagnosis present

## 2018-09-27 LAB — POCT URINALYSIS DIP (MANUAL ENTRY)
Bilirubin, UA: NEGATIVE
Glucose, UA: NEGATIVE mg/dL
Ketones, POC UA: NEGATIVE mg/dL
Nitrite, UA: NEGATIVE
Protein Ur, POC: NEGATIVE mg/dL
Spec Grav, UA: 1.02 (ref 1.010–1.025)
Urobilinogen, UA: 0.2 E.U./dL
pH, UA: 7 (ref 5.0–8.0)

## 2018-09-27 MED ORDER — PREDNISONE 20 MG PO TABS: ORAL_TABLET | ORAL | 0 refills | Status: DC

## 2018-09-27 MED ORDER — CIPROFLOXACIN HCL 500 MG PO TABS: 500.0000 mg | ORAL_TABLET | Freq: Two times a day (BID) | ORAL | 0 refills | Status: DC

## 2018-09-27 MED ORDER — FLUCONAZOLE 150 MG PO TABS: 150.0000 mg | ORAL_TABLET | Freq: Once | ORAL | 0 refills | Status: AC

## 2018-09-27 NOTE — Discharge Instructions (Addendum)
Just take the prednisone today and maybe tomorrow.

## 2018-09-27 NOTE — ED Triage Notes (Signed)
Per pt she has been having urination frequency and lower right back pain since Wednesday and getting worse over the last few days. No blood in urine. No burning.

## 2018-09-27 NOTE — ED Provider Notes (Signed)
[facility]    CSN: [id number] Arrival date & time: 09/27/18  1219     History   Chief Complaint Chief Complaint  Patient presents with  . Urinary Frequency    pain in right lower back    HPI [name] is a 63 y.o. female.   Per pt she has been having urination frequency and lower right back pain since Wednesday and getting worse over the last few days. No blood in urine. No burning.   Also has right hip pain that has worsened over the same place      Past Medical History:  Diagnosis Date  . Acute bronchitis   . Acute maxillary sinusitis   . Allergic rhinitis   . Dysmetabolic syndrome    History of  . Essential hypertension, malignant   . Extrinsic asthma, unspecified   . Fibroids   . H/O: GI bleed   . History of arteritis   . Ischemic colitis, enteritis, or enterocolitis ([place])    Hisotry of  . Obesity, unspecified   . Otitis media of both ears   . Recurrent pulmonary emboli ([place]) 2012 and 2018   Saddle embolism 2018  . Sinusitis   . Type II or unspecified type diabetes mellitus without mention of complication, not stated as uncontrolled    oral meds-insulin resistance  . Undifferentiated connective tissue disease ([place])    History of    Patient Active Problem List   Diagnosis Date Noted  . Recurrent pulmonary emboli ([place]) 05/30/2017  . Elevated troponin 01/26/2017  . DOE (dyspnea on exertion) 06/28/2011  . Constipation, acute 06/26/2011  . HTN (hypertension) 06/25/2011  . Type 2 diabetes mellitus with complication, without long-term current use of insulin ([place]) 06/25/2011  . Connective tissue disorder ([place]) 06/25/2011    Past Surgical History:  Procedure Laterality Date  . ABDOMINAL HYSTERECTOMY  2000  . BREAST EXCISIONAL BIOPSY Left 1978  . CARDIAC CATHETERIZATION  09/25/05  . COLONOSCOPY W/ BIOPSIES      OB History   No obstetric history on file.      Home Medications    Prior to Admission medications   Medication Sig  Start Date End Date Taking? Authorizing Provider  [facility] HFA 100 MCG/ACT AERO Inhale 2 puffs into the lungs 2 (two) times daily. 03/22/17   [provider]  BIOTIN 5000 PO Take 5,000 mg by mouth at bedtime.    [provider]  cholecalciferol (VITAMIN D) 1000 UNITS tablet Take 1,000 Units by mouth daily.    [provider]  ciprofloxacin (CIPRO) 500 MG tablet Take 1 tablet (500 mg total) by mouth 2 (two) times daily. 09/27/18   Robyn Haber, MD  diltiazem (CARDIZEM CD) 360 MG 24 hr capsule Take 360 mg by mouth daily. 11/27/14   [provider]  fluconazole (DIFLUCAN) 150 MG tablet Take 1 tablet (150 mg total) by mouth once for 1 dose. Repeat if needed 09/27/18 09/27/18  Robyn Haber, MD  fluticasone [facility]) 50 MCG/ACT nasal spray Place 2 sprays into both nostrils daily as needed for allergies or rhinitis.    [provider]  fluticasone (FLOVENT HFA) 44 MCG/ACT inhaler Inhale 2 puffs into the lungs 2 (two) times daily as needed (allergies).    [provider]  gabapentin (NEURONTIN) 300 MG capsule Take 300 mg by mouth at bedtime.     [provider]  hydrochlorothiazide (MICROZIDE) 12.5 MG capsule TAKE 1 CAPSULE BY MOUTH EVERY DAY 04/28/18   Daneen Schick  W, MD  losartan-hydrochlorothiazide (HYZAAR) 100-12.5 MG tablet Take 1 tablet by mouth daily. 05/15/17   Belva Crome, MD  magnesium oxide (MAG-OX) 400 MG tablet Take 1 tablet by mouth 2 (two) times daily. 05/09/17   [provider]  metFORMIN (GLUCOPHAGE) 500 MG tablet Take 500 mg by mouth every evening.     [provider]  metoprolol succinate (TOPROL-XL) 25 MG 24 hr tablet TAKE 0.5 TABLETS (12.5 MG TOTAL) BY MOUTH DAILY. 04/28/18   Belva Crome, MD  montelukast (SINGULAIR) 10 MG tablet Take 10 mg by mouth daily. 11/10/14   [provider]  predniSONE (DELTASONE) 20 MG tablet Two daily with food 09/27/18   Robyn Haber, MD  XARELTO 20 MG TABS tablet  Take 20 mg by mouth daily. 04/23/17   [provider]  XOPENEX HFA 45 MCG/ACT inhaler Inhale 1-2 puffs into the lungs every 6 (six) hours as needed for wheezing. Reported on 08/08/2015 12/06/14   [provider]    Family History Family History  Problem Relation Age of Onset  . Kidney disease Mother   . Congestive Heart Failure Mother   . DES usage Mother   . Kidney disease Father   . Hypertension Father   . Acute myelogenous leukemia Sister        APL  . Sickle cell trait Daughter   . Breast cancer Cousin     Social History Social History   Tobacco Use  . Smoking status: Never Smoker  . Smokeless tobacco: Never Used  . Tobacco comment: Patient reports extensive passive tobacco smoke exposure  Substance Use Topics  . Alcohol use: No  . Drug use: No     Allergies   Morphine and related   Review of Systems Review of Systems   Physical Exam Triage Vital Signs ED Triage Vitals  Enc Vitals Group     BP 09/27/18 1228 (!) 137/93     Pulse Rate 09/27/18 1228 99     Resp 09/27/18 1228 16     Temp 09/27/18 1228 97.6 F (36.4 C)     Temp Source 09/27/18 1228 Oral     SpO2 09/27/18 1228 99 %     Weight 09/27/18 1233 188 lb (85.3 kg)     Height 09/27/18 1233 5\' 3"  (1.6 m)     Head Circumference --      Peak Flow --      Pain Score 09/27/18 1233 8     Pain Loc --      Pain Edu? --      Excl. in [place]? --    No data found.  Updated Vital Signs BP (!) 137/93 (BP Location: Left Arm)   Pulse 99   Temp 97.6 F (36.4 C) (Oral)   Resp 16   Ht 5\' 3"  (1.6 m)   Wt 85.3 kg   SpO2 99%   BMI 33.30 kg/m    Physical Exam Vitals signs and nursing note reviewed.  Constitutional:      Appearance: Normal appearance.  Pulmonary:     Effort: Pulmonary effort is normal.  Abdominal:     General: Bowel sounds are normal.     Tenderness: There is no abdominal tenderness.  Musculoskeletal:     Comments: Tender right posterior right pelvis  Pain with hip  flexion  Skin:    General: Skin is warm.  Neurological:     General: No focal deficit present.     Mental Status: She is alert.  Psychiatric:        Mood and Affect: Mood normal.      UC Treatments / Results  Labs (all labs ordered are listed, but only abnormal results are displayed) Labs Reviewed  POCT URINALYSIS DIP (MANUAL ENTRY) - Abnormal; Notable for the following components:      Result Value   Blood, UA trace-intact (*)    Leukocytes, UA Small (1+) (*)    All other components within normal limits  URINE CULTURE    EKG None  Radiology No results found.  Procedures Procedures (including critical care time)  Medications Ordered in UC Medications - No data to display  Initial Impression / Assessment and Plan / UC Course  I have reviewed the triage vital signs and the nursing notes.  Pertinent labs & imaging results that were available during my care of the patient were reviewed by me and considered in my medical decision making (see chart for details).    Final Clinical Impressions(s) / UC Diagnoses   Final diagnoses:  Acute cystitis without hematuria  Right hip pain     Discharge Instructions     Just take the prednisone today and maybe tomorrow.    ED Prescriptions    Medication Sig Dispense Auth. Provider   ciprofloxacin (CIPRO) 500 MG tablet Take 1 tablet (500 mg total) by mouth 2 (two) times daily. 10 tablet Robyn Haber, MD   predniSONE (DELTASONE) 20 MG tablet Two daily with food 10 tablet Robyn Haber, MD   fluconazole (DIFLUCAN) 150 MG tablet Take 1 tablet (150 mg total) by mouth once for 1 dose. Repeat if needed 2 tablet Robyn Haber, MD     Controlled Substance Prescriptions [place] Controlled Substance Registry consulted? Not Applicable   Robyn Haber, MD 09/27/18 [phone]

## 2018-09-30 LAB — URINE CULTURE: Culture: <10000 — AB

## 2018-10-03 ENCOUNTER — Ambulatory Visit
Admission: EM | Admit: 2018-10-03 | Discharge: 2018-10-03 | Disposition: A | Discharge status: Home / Self Care | Payer: BC Managed Care – PPO | Attending: Family Medicine | Admitting: Family Medicine

## 2018-10-03 ENCOUNTER — Ambulatory Visit (INDEPENDENT_AMBULATORY_CARE_PROVIDER_SITE_OTHER): Payer: BC Managed Care – PPO

## 2018-10-03 DIAGNOSIS — M5441 Lumbago with sciatica, right side: Secondary | ICD-10-CM | POA: Diagnosis not present

## 2018-10-03 NOTE — ED Triage Notes (Signed)
Patient complains of right sided low back pain X 1.5 wk with right buttock and thigh pain and mild numbness and tingling. Patient denies injury. Patient was here Saturday 2/15 and is being treated for UTI.

## 2018-10-03 NOTE — ED Provider Notes (Signed)
[facility]    CSN: [id number] Arrival date & time: 10/03/18  1024     History   Chief Complaint Chief Complaint  Patient presents with  . Back Pain    HPI [name] is a 63 y.o. 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  Patient states that over the past 1.5 weeks she has had right-sided lower back pain.  States that the pain is worse when she applies pressure to the side.  Does note some numbness and tingling that radiates into her right buttock and proximal thigh.  Denies numbness or tingling between thighs/saddle anesthesia.  She was seen here approximately 1 week ago for possible UTI with similar symptoms and started on antibiotics as well as prednisone for MSK cause.  Prednisone eased symptoms but still persist despite finishing this course.  She initially had frequency and urgency of urination but this is resolved.  Did not she denies any issues with controlling urination or bowel habits.  Denies weakness in legs.  She is also tried Tylenol with minimal relief.  She is hoping to get x-ray today per request of her PCP.  HPI  Past Medical History:  Diagnosis Date  . Acute bronchitis   . Acute maxillary sinusitis   . Allergic rhinitis   . Dysmetabolic syndrome    History of  . Essential hypertension, malignant   . Extrinsic asthma, unspecified   . Fibroids   . H/O: GI bleed   . History of arteritis   . Ischemic colitis, enteritis, or enterocolitis ([place])    Hisotry of  . Obesity, unspecified   . Otitis media of both ears   . Recurrent pulmonary emboli ([place]) 2012 and 2018   Saddle embolism 2018  . Sinusitis   . Type II or unspecified type diabetes mellitus without mention of complication, not stated as uncontrolled    oral meds-insulin resistance  . Undifferentiated connective tissue disease ([place])    History of    Patient Active Problem List   Diagnosis Date Noted  .  Recurrent pulmonary emboli ([place]) 05/30/2017  . Elevated troponin 01/26/2017  . DOE (dyspnea on exertion) 06/28/2011  . Constipation, acute 06/26/2011  . HTN (hypertension) 06/25/2011  . Type 2 diabetes mellitus with complication, without long-term current use of insulin ([place]) 06/25/2011  . Connective tissue disorder ([place]) 06/25/2011    Past Surgical History:  Procedure Laterality Date  . ABDOMINAL HYSTERECTOMY  2000  . BREAST EXCISIONAL BIOPSY Left 1978  . CARDIAC CATHETERIZATION  09/25/05  . COLONOSCOPY W/ BIOPSIES      OB History   No obstetric history on file.      Home Medications    Prior to Admission medications   Medication Sig Start Date End Date Taking? Authorizing Provider  [facility] HFA 100 MCG/ACT AERO Inhale 2 puffs into the lungs 2 (two) times daily. 03/22/17   [provider]  BIOTIN 5000 PO Take 5,000 mg by mouth at bedtime.    [provider]  cholecalciferol (VITAMIN D) 1000 UNITS tablet Take 1,000 Units by mouth daily.    [provider]  ciprofloxacin (CIPRO) 500 MG tablet Take 1 tablet (500 mg total) by mouth 2 (two) times daily. 09/27/18   Robyn Haber, MD  diltiazem (CARDIZEM CD) 360 MG 24 hr capsule Take 360 mg by mouth daily. 11/27/14   [provider]  fluticasone (FLONASE) 50 MCG/ACT nasal spray Place 2 sprays  into both nostrils daily as needed for allergies or rhinitis.    [provider]  fluticasone (FLOVENT HFA) 44 MCG/ACT inhaler Inhale 2 puffs into the lungs 2 (two) times daily as needed (allergies).    [provider]  gabapentin (NEURONTIN) 300 MG capsule Take 300 mg by mouth at bedtime.     [provider]  hydrochlorothiazide (MICROZIDE) 12.5 MG capsule TAKE 1 CAPSULE BY MOUTH EVERY DAY 04/28/18   Belva Crome, MD  losartan-hydrochlorothiazide (HYZAAR) 100-12.5 MG tablet Take 1 tablet by mouth daily. 05/15/17   Belva Crome, MD  magnesium oxide (MAG-OX) 400 MG tablet Take 1 tablet  by mouth 2 (two) times daily. 05/09/17   [provider]  metFORMIN (GLUCOPHAGE) 500 MG tablet Take 500 mg by mouth every evening.     [provider]  metoprolol succinate (TOPROL-XL) 25 MG 24 hr tablet TAKE 0.5 TABLETS (12.5 MG TOTAL) BY MOUTH DAILY. 04/28/18   Belva Crome, MD  montelukast (SINGULAIR) 10 MG tablet Take 10 mg by mouth daily. 11/10/14   [provider]  predniSONE (DELTASONE) 20 MG tablet Two daily with food 09/27/18   Robyn Haber, MD  XARELTO 20 MG TABS tablet Take 20 mg by mouth daily. 04/23/17   [provider]  XOPENEX HFA 45 MCG/ACT inhaler Inhale 1-2 puffs into the lungs every 6 (six) hours as needed for wheezing. Reported on 08/08/2015 12/06/14   [provider]    Family History Family History  Problem Relation Age of Onset  . Kidney disease Mother   . Congestive Heart Failure Mother   . DES usage Mother   . Kidney disease Father   . Hypertension Father   . Acute myelogenous leukemia Sister        APL  . Sickle cell trait Daughter   . Breast cancer Cousin     Social History Social History   Tobacco Use  . Smoking status: Never Smoker  . Smokeless tobacco: Never Used  . Tobacco comment: Patient reports extensive passive tobacco smoke exposure  Substance Use Topics  . Alcohol use: No  . Drug use: No     Allergies   Morphine and related   Review of Systems Review of Systems  Constitutional: Negative for fatigue and fever.  Eyes: Negative for visual disturbance.  Respiratory: Negative for shortness of breath.   Cardiovascular: Negative for chest pain.  Gastrointestinal: Negative for abdominal pain, nausea and vomiting.  Genitourinary: Negative for decreased urine volume, difficulty urinating, frequency and urgency.  Musculoskeletal: Positive for back pain. Negative for arthralgias and joint swelling.  Skin: Negative for color change, rash and wound.  Neurological: Positive for numbness. Negative for  dizziness, weakness, light-headedness and headaches.     Physical Exam Triage Vital Signs ED Triage Vitals [10/03/18 1034]  Enc Vitals Group     BP (!) 141/92     Pulse Rate 92     Resp 12     Temp 98.3 F (36.8 C)     Temp Source Oral     SpO2 98 %     Weight      Height      Head Circumference      Peak Flow      Pain Score      Pain Loc      Pain Edu?      Excl. in [place]?    No data found.  Updated Vital Signs BP (!) 141/92 (BP Location: Left Arm)  Pulse 92   Temp 98.3 F (36.8 C) (Oral)   Resp 12   SpO2 98%   Visual Acuity Right Eye Distance:   Left Eye Distance:   Bilateral Distance:    Right Eye Near:   Left Eye Near:    Bilateral Near:     Physical Exam Vitals signs and nursing note reviewed.  Constitutional:      General: She is not in acute distress.    Appearance: She is well-developed.  HENT:     Head: Normocephalic and atraumatic.  Eyes:     Conjunctiva/sclera: Conjunctivae normal.  Neck:     Musculoskeletal: Neck supple.  Cardiovascular:     Rate and Rhythm: Normal rate and regular rhythm.     Heart sounds: No murmur.  Pulmonary:     Effort: Pulmonary effort is normal. No respiratory distress.     Breath sounds: Normal breath sounds.     Comments: Breathing comfortably at rest, CTABL, no wheezing, rales or other adventitious sounds auscultated Abdominal:     Palpations: Abdomen is soft.     Tenderness: There is no abdominal tenderness.  Musculoskeletal:     Comments: Sitting slightly asymmetrical with most of weight on left side, nontender to palpation of cervical, thoracic and lumbar spine midline, mild tenderness to upper lumbar region on right side laterally, negative straight leg raise bilaterally  Hip strength 5/5 and equal bilaterally, patellar reflexes difficult to obtain bilaterally  Skin:    General: Skin is warm and dry.  Neurological:     Mental Status: She is alert.      UC Treatments / Results  Labs (all labs  ordered are listed, but only abnormal results are displayed) Labs Reviewed - No data to display  EKG None  Radiology Dg Lumbar Spine Complete  Result Date: 10/03/2018 CLINICAL DATA:  Right-sided low back pain. EXAM: LUMBAR SPINE - COMPLETE 4+ VIEW COMPARISON:  Body CT 06/12/2017 FINDINGS: There is no evidence of lumbar spine fracture. Alignment is normal. Intervertebral disc spaces are maintained. IMPRESSION: Negative. Electronically Signed   By: Fidela Salisbury M.D.   On: 10/03/2018 11:29    Procedures Procedures (including critical care time)  Medications Ordered in UC Medications - No data to display  Initial Impression / Assessment and Plan / UC Course  I have reviewed the triage vital signs and the nursing notes.  Pertinent labs & imaging results that were available during my care of the patient were reviewed by me and considered in my medical decision making (see chart for details).     Discussed with patient x-ray likely not going to change course of plan but still obtained given patient's concern and request of PCP, symptoms suggestive of sciatica.  X-ray negative.  Given patient on Xarelto, treatment limited to steroids, Tylenol, muscle relaxers.  Patient recently on course of prednisone.  Patient wanting to defer any further treatment and continue Tylenol, daily Flexeril.  Follow-up with PCP as planned.Discussed strict return precautions. Patient verbalized understanding and is agreeable with plan.  Final Clinical Impressions(s) / UC Diagnoses   Final diagnoses:  Acute right-sided low back pain with right-sided sciatica   Discharge Instructions   None    ED Prescriptions    None     Controlled Substance Prescriptions [place] Controlled Substance Registry consulted? Not Applicable   Janith Lima, [place] 10/03/18 1156

## 2018-10-21 ENCOUNTER — Ambulatory Visit
Admission: RE | Admit: 2018-10-21 | Discharge: 2018-10-21 | Disposition: A | Discharge status: Home / Self Care | Payer: BC Managed Care – PPO | Source: Ambulatory Visit | Attending: Internal Medicine | Admitting: Internal Medicine

## 2018-10-21 DIAGNOSIS — Z1231 Encounter for screening mammogram for malignant neoplasm of breast: Secondary | ICD-10-CM

## 2018-10-23 ENCOUNTER — Other Ambulatory Visit: Payer: Self-pay | Admitting: Internal Medicine

## 2018-10-23 DIAGNOSIS — K5792 Diverticulitis of intestine, part unspecified, without perforation or abscess without bleeding: Secondary | ICD-10-CM

## 2018-10-23 DIAGNOSIS — R1031 Right lower quadrant pain: Secondary | ICD-10-CM

## 2018-10-30 ENCOUNTER — Other Ambulatory Visit: Payer: Self-pay | Admitting: Internal Medicine

## 2018-10-30 DIAGNOSIS — R109 Unspecified abdominal pain: Secondary | ICD-10-CM

## 2018-10-30 DIAGNOSIS — N390 Urinary tract infection, site not specified: Secondary | ICD-10-CM

## 2018-10-31 ENCOUNTER — Other Ambulatory Visit: Payer: Self-pay | Admitting: Internal Medicine

## 2018-10-31 DIAGNOSIS — R1031 Right lower quadrant pain: Secondary | ICD-10-CM

## 2018-10-31 DIAGNOSIS — N390 Urinary tract infection, site not specified: Secondary | ICD-10-CM

## 2018-11-03 ENCOUNTER — Other Ambulatory Visit: Payer: BC Managed Care – PPO

## 2018-11-04 ENCOUNTER — Other Ambulatory Visit: Payer: Self-pay

## 2018-11-04 ENCOUNTER — Inpatient Hospital Stay: Admission: RE | Admit: 2018-11-04 | Payer: BC Managed Care – PPO | Source: Ambulatory Visit

## 2018-11-04 ENCOUNTER — Ambulatory Visit
Admission: RE | Admit: 2018-11-04 | Discharge: 2018-11-04 | Disposition: A | Discharge status: Home / Self Care | Payer: BC Managed Care – PPO | Source: Ambulatory Visit | Attending: Internal Medicine | Admitting: Internal Medicine

## 2018-11-04 DIAGNOSIS — R1031 Right lower quadrant pain: Secondary | ICD-10-CM

## 2018-11-04 DIAGNOSIS — N390 Urinary tract infection, site not specified: Secondary | ICD-10-CM

## 2018-11-04 MED ORDER — IOPAMIDOL (ISOVUE-300) INJECTION 61%
100.0000 mL | Freq: Once | INTRAVENOUS | Status: AC | PRN
  Administered 2018-11-04: 100 mL via INTRAVENOUS

## 2019-04-14 ENCOUNTER — Other Ambulatory Visit: Payer: Self-pay | Admitting: Interventional Cardiology

## 2019-05-10 ENCOUNTER — Other Ambulatory Visit: Payer: Self-pay | Admitting: Interventional Cardiology

## 2019-06-04 ENCOUNTER — Other Ambulatory Visit: Payer: Self-pay | Admitting: Interventional Cardiology

## 2019-06-29 ENCOUNTER — Other Ambulatory Visit: Payer: Self-pay | Admitting: Interventional Cardiology

## 2019-06-30 ENCOUNTER — Inpatient Hospital Stay: Payer: BC Managed Care – PPO | Attending: Oncology | Admitting: Oncology

## 2019-06-30 ENCOUNTER — Other Ambulatory Visit: Payer: Self-pay

## 2019-06-30 VITALS — BP 140/87 | HR 99 | Temp 97.8°F | Resp 18 | Ht 63.0 in | Wt 178.0 lb

## 2019-06-30 DIAGNOSIS — Z7901 Long term (current) use of anticoagulants: Secondary | ICD-10-CM | POA: Insufficient documentation

## 2019-06-30 DIAGNOSIS — I2699 Other pulmonary embolism without acute cor pulmonale: Secondary | ICD-10-CM

## 2019-06-30 DIAGNOSIS — E119 Type 2 diabetes mellitus without complications: Secondary | ICD-10-CM | POA: Insufficient documentation

## 2019-06-30 DIAGNOSIS — Z79899 Other long term (current) drug therapy: Secondary | ICD-10-CM | POA: Diagnosis not present

## 2019-06-30 DIAGNOSIS — Z90710 Acquired absence of both cervix and uterus: Secondary | ICD-10-CM | POA: Insufficient documentation

## 2019-06-30 DIAGNOSIS — J45909 Unspecified asthma, uncomplicated: Secondary | ICD-10-CM | POA: Insufficient documentation

## 2019-06-30 DIAGNOSIS — D6862 Lupus anticoagulant syndrome: Secondary | ICD-10-CM | POA: Diagnosis not present

## 2019-06-30 NOTE — Progress Notes (Addendum)
[place] OFFICE PROGRESS NOTE   Diagnosis: Recurrent pulmonary embolism  INTERVAL HISTORY:   [name] is maintained on chronic Xarelto anticoagulation after experiencing 2 episodes of pulmonary embolism.  The last episode occurred in June 2018 2018.  She was evaluated by Dr. Lebron Conners and Dr. Audelia Hives.  No risk factor for venous thromboembolism was identified.  A hypercoagulation panel was negative other than a positive anticoagulant.  She reports being maintained on Xarelto at the time of this testing.  She feels well.  No bleeding other than easy bruising.  She reports Dr. Marlou Sa decrease the Xarelto dose to 15 mg daily secondary to bruising.  Ms. Vanginkel feels well.  No symptom of recurrent thrombosis.  She reports being followed by cardiology for persistent tachycardia.  She is up-to-date on mammography and colon cancer screening.  Objective:  Vital signs in last 24 hours:  Blood pressure 140/87, pulse 99, temperature 97.8 F (36.6 C), resp. rate 18, height 5\' 3"  (1.6 m), weight 178 lb (80.7 kg), SpO2 100 %.   Limited physical examination secondary to distancing with the Covid pandemic HEENT: Neck without mass Lymphatics: No cervical, supraclavicular, axillary, or inguinal nodes GI: No hepatosplenomegaly, no mass, nontender Vascular: No leg edema   Lab Results:  Lab Results  Component Value Date   WBC 4.7 05/30/2017   HGB 13.7 05/30/2017   HCT 41.2 05/30/2017   MCV 91.4 05/30/2017   PLT 359 05/30/2017   NEUTROABS 2.1 05/30/2017    CMP  Lab Results  Component Value Date   NA 140 05/30/2017   K 4.1 05/30/2017   CL 99 05/29/2017   CO2 27 05/30/2017   GLUCOSE 91 05/30/2017   BUN 10.9 05/30/2017   CREATININE 1.1 05/30/2017   CALCIUM 9.7 05/30/2017   PROT 7.6 06/13/2017   ALBUMIN 4.4 05/30/2017   AST 17 05/30/2017   ALT 15 05/30/2017   ALKPHOS 90 05/30/2017   BILITOT 0.49 05/30/2017   GFRNONAA 55 (L) 05/29/2017   GFRAA 64 05/29/2017      Medications: I have reviewed the patient's current medications.   Assessment/Plan: 1.  Recurrent pulmonary embolism  November 2012-multiple bilateral segmental pulmonary emboli, Dopplers negative for deep vein thrombosis, negative thrombophilia panel  Treated with warfarin for 3 years, then anticoagulation discontinued  June 2018-bilateral lobar pulmonary emboli with right heart strain, Dopplers negative for deep vein thrombosis, treatment started with rivaroxaban  05/30/2017- DRVVT positive for a lupus anticoagulant, was maintained on rivaroxaban, no other abnormality  Rivaroxaban dose decreased to 15 mg daily secondary to easy bruising  2.  Diabetes 3.  Asthma 4.  G1, P1 5.  "Tachycardia " 6.   Neuropathy 7.  Hysterectomy  Disposition: Ms. Stauff has a history of recurrent unprovoked pulmonary embolism.  She is maintained on indefinite rivaroxaban anticoagulation.  I agree with indefinite anticoagulation in her case.  She is currently taking reduced dose rivaroxaban.  I explained lack of clear data to support reduced dose anticoagulation in the setting.  There is no clear explanation for the recurrent episodes of thromboembolic disease in her case.  I suspect the positive lupus anticoagulant in 2018 was related to testing while on rivaroxaban.  I will discuss the rivaroxaban dosing with Dr. Marlou Sa.  Ms. Powe would like to continue follow-up in the hematology clinic.  She will return for an office visit in 1 year.  Betsy Coder, MD  06/30/2019  3:34 PM I reviewed the CT images from 11/04/2018 with a radiologist.  The  appendix was felt to be normal.

## 2019-07-01 ENCOUNTER — Telehealth: Payer: Self-pay | Admitting: Oncology

## 2019-07-01 NOTE — Telephone Encounter (Signed)
Scheduled per los. Called and left msg. Mailed printout  °

## 2019-09-02 ENCOUNTER — Ambulatory Visit: Payer: Self-pay

## 2019-09-02 ENCOUNTER — Ambulatory Visit: Payer: BC Managed Care – PPO | Admitting: Orthopaedic Surgery

## 2019-09-02 ENCOUNTER — Other Ambulatory Visit: Payer: Self-pay

## 2019-09-02 ENCOUNTER — Encounter: Payer: Self-pay | Admitting: Orthopaedic Surgery

## 2019-09-02 DIAGNOSIS — M79641 Pain in right hand: Secondary | ICD-10-CM

## 2019-09-02 MED ORDER — LIDOCAINE HCL 1 % IJ SOLN
0.5000 mL | INTRAMUSCULAR | Status: AC | PRN
  Administered 2019-09-02: 16:00:00 .5 mL

## 2019-09-02 MED ORDER — METHYLPREDNISOLONE ACETATE 40 MG/ML IJ SUSP
20.0000 mg | INTRAMUSCULAR | Status: AC | PRN
  Administered 2019-09-02: 16:00:00 20 mg

## 2019-09-02 NOTE — Progress Notes (Signed)
Office Visit Note   Patient: [name]           Date of Birth: [birth year]           MRN: [id number] Visit Date: 09/02/2019              Requested by: Rogers Blocker, MD [address] [place],  [place] [address] PCP: Rogers Blocker, MD   Assessment & Plan: Visit Diagnoses:  1. 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  Also having active triggering and pain of the right thumb.  I did not see any abnormalities by x-ray.  [name] has been experiencing some discomfort along the dorsum of her hand but I did not see anything by exam today there was no swelling or ecchymosis.  There is no evidence of carpal tunnel.  I have discussed trigger thumb surgery.  She prefer to have another cortisone injection which was performed and will apply a soft [place] splint for the base of her thumb and plan to see her back as needed  Follow-Up Instructions: Return if symptoms worsen or fail to improve.   Orders:  Orders Placed This Encounter  Procedures  . Hand/UE Inj: R thumb A1  . XR Hand Complete Right   No orders of the defined types were placed in this encounter.     Procedures: Hand/UE Inj: R thumb A1 for trigger finger on 09/02/2019 3:40 PM Details: 27 G needle, volar approach Medications: 0.5 mL lidocaine 1 %; 20 mg methylPREDNISolone acetate 40 MG/ML      Clinical Data: No additional findings.   Subjective: Chief Complaint  Patient presents with  . Right Hand - Pain  Patient presents today for right hand pain. No known injury. She said that her hand has been hurting for 6 months and worsening. Her pain is located at the thumb and radiates across the backside of her hand. She also has pain along the lateral side in between her thumb and index finger. She said that her hand will swell with use. She has burning in her the upper part of her arm. She is right hand  dominant. She takes Tylenol for pain.  She also has a trigger finger in her thumb on the right side that has remained the same. She was last here for her trigger finger in 2019. Has history of pulmonary emboli x2 and is on a blood thinner.  Limited in her ability to take anything other than Tylenol.  Right hand dominant.  Not having a problem in the left hand.  No related numbness or tingling.  Still having triggering of the right thumb but also some tenderness near the base of the thumb  HPI  Review of Systems   Objective: Vital Signs: Ht 5\' 3"  (1.6 m)   Wt 176 lb (79.8 kg)   BMI 31.18 kg/m   Physical Exam Constitutional:      Appearance: She is well-developed.  Eyes:     Pupils: Pupils are equal, round, and reactive to light.  Pulmonary:     Effort: Pulmonary effort is normal.  Skin:    General: Skin is warm and dry.  Neurological:     Mental Status: She is alert and oriented to person, place, and time.  Psychiatric:        Behavior: Behavior normal.     Ortho  Exam awake alert and oriented x3.  Comfortable sitting right hand was not swollen.  Could make a full fist.  Definitely has triggering of the right thumb and hesitant to move the IP joint for fear of a triggering.  There is a painful nodule on the palmar aspect of the thumb near the metacarpal phalangeal joint consistent with her triggering.  Also some prominence at the base of the thumb at the carpometacarpal joint with minimally positive grind test.  Negative Tinel's over the median nerve  Specialty Comments:  No specialty comments available.  Imaging: XR Hand Complete Right  Result Date: 09/02/2019 Films of the right hand were obtained in several projections.  Some very minimal degenerative changes at the base of the thumb but I did not see any osteophytes or subluxation.  No abnormalities about the carpus or the wrist.  No ectopic calcification.  No evidence of a fracture    PMFS History: Patient Active Problem  List   Diagnosis Date Noted  . Pain in right hand 09/02/2019  . Recurrent pulmonary emboli ([place]) 05/30/2017  . Elevated troponin 01/26/2017  . DOE (dyspnea on exertion) 06/28/2011  . Constipation, acute 06/26/2011  . HTN (hypertension) 06/25/2011  . Type 2 diabetes mellitus with complication, without long-term current use of insulin ([place]) 06/25/2011  . Connective tissue disorder ([place]) 06/25/2011   Past Medical History:  Diagnosis Date  . Acute bronchitis   . Acute maxillary sinusitis   . Allergic rhinitis   . Dysmetabolic syndrome    History of  . Essential hypertension, malignant   . Extrinsic asthma, unspecified   . Fibroids   . H/O: GI bleed   . History of arteritis   . Ischemic colitis, enteritis, or enterocolitis ([place])    Hisotry of  . Obesity, unspecified   . Otitis media of both ears   . Recurrent pulmonary emboli ([place]) 2012 and 2018   Saddle embolism 2018  . Sinusitis   . Type II or unspecified type diabetes mellitus without mention of complication, not stated as uncontrolled    oral meds-insulin resistance  . Undifferentiated connective tissue disease ([place])    History of    Family History  Problem Relation Age of Onset  . Kidney disease Mother   . Congestive Heart Failure Mother   . DES usage Mother   . Kidney disease Father   . Hypertension Father   . Acute myelogenous leukemia Sister        APL  . Sickle cell trait Daughter   . Breast cancer Cousin     Past Surgical History:  Procedure Laterality Date  . ABDOMINAL HYSTERECTOMY  2000  . BREAST EXCISIONAL BIOPSY Left 1978  . CARDIAC CATHETERIZATION  09/25/05  . COLONOSCOPY W/ BIOPSIES     Social History   Occupational History  . Not on file  Tobacco Use  . Smoking status: Never Smoker  . Smokeless tobacco: Never Used  . Tobacco comment: Patient reports extensive passive tobacco smoke exposure  Substance and Sexual Activity  . Alcohol use: No  . Drug use: No  . Sexual activity: Never

## 2019-09-14 NOTE — Progress Notes (Signed)
Cardiology Office Note:    Date:  09/15/2019   ID:  [name], DOB 1956-06-12, MRN [id number]  PCP:  Rogers Blocker, MD  Cardiologist:  No primary care provider on file.   Referring MD: Rogers Blocker, MD   Chief Complaint  Patient presents with  . Hypertension    History of Present Illness:    [name] is a 64 y.o. female with a hx of recurrent PEx 2 most recently saddle embolusJune 2018, essential hypertensionwith LVH, connective tissue disease(question specific diagnosis), and chronic dyspnea.  [name] feels better than when I last saw her and feels it is due to a change in her bronchodilator therapy employed by pulmonology relatively recently.  Her breathing is not tight.  She does not have a dyspnea on exertion.  Lower extremity swelling has resolved and not recurred.  Dr. Marlou Anderson devised a regimen of diuretic therapy to control the edema that has worked.  This involves taking chlorthalidone 25 mg 2 times per week on top of her other medications which include Hyzaar 100/25 mg/day.  Past Medical History:  Diagnosis Date  . Acute bronchitis   . Acute maxillary sinusitis   . Allergic rhinitis   . Dysmetabolic syndrome    History of  . Essential hypertension, malignant   . Extrinsic asthma, unspecified   . Fibroids   . H/O: GI bleed   . History of arteritis   . Ischemic colitis, enteritis, or enterocolitis ([place])    Hisotry of  . Obesity, unspecified   . Otitis media of both ears   . Recurrent pulmonary emboli ([place]) 2012 and 2018   Saddle embolism 2018  . Sinusitis   . Type II or unspecified type diabetes mellitus without mention of complication, not stated as uncontrolled    oral meds-insulin resistance  . Undifferentiated connective tissue disease ([place])    History of    Past Surgical History:  Procedure Laterality Date  . ABDOMINAL HYSTERECTOMY  2000  . BREAST EXCISIONAL BIOPSY Left 1978  . CARDIAC CATHETERIZATION  09/25/05  . COLONOSCOPY W/ BIOPSIES       Current Medications: Current Meds  Medication Sig  . Azelastine HCl 0.15 % SOLN Place 1 spray into the nose 2 (two) times daily.  Marland Kitchen BIOTIN 5000 PO Take 5,000 mg by mouth at bedtime.  . chlorthalidone (HYGROTON) 25 MG tablet Take 25 mg by mouth 2 (two) times a week.  . cholecalciferol (VITAMIN D) 1000 UNITS tablet Take 1,000 Units by mouth daily.  . cyclobenzaprine (FLEXERIL) 5 MG tablet Take 5 mg by mouth 2 (two) times daily as needed.  . diltiazem (CARDIZEM CD) 360 MG 24 hr capsule Take 360 mg by mouth daily.  . fluticasone-salmeterol (ADVAIR HFA) 230-21 MCG/ACT inhaler Inhale into the lungs as needed.  . gabapentin (NEURONTIN) 300 MG capsule Take 600 mg by mouth at bedtime.   Marland Kitchen losartan-hydrochlorothiazide (HYZAAR) 100-25 MG tablet Take 1 tablet by mouth daily.  . magnesium oxide (MAG-OX) 400 MG tablet Take 400 mg by mouth daily.  . metFORMIN (GLUCOPHAGE) 500 MG tablet Take 500 mg by mouth every evening.   . metoprolol succinate (TOPROL-XL) 25 MG 24 hr tablet Take 0.5 tablets (12.5 mg total) by mouth daily.  . montelukast (SINGULAIR) 10 MG tablet Take 10 mg by mouth daily.  . pantoprazole (PROTONIX) 40 MG tablet Take 40 mg by mouth daily.  . Rivaroxaban (XARELTO) 15 MG TABS tablet Take 15 mg by mouth daily.  Penne Lash HFA 45  MCG/ACT inhaler Inhale 1-2 puffs into the lungs every 6 (six) hours as needed for wheezing. Reported on 08/08/2015     Allergies:   Morphine and related   Social History   Socioeconomic History  . Marital status: Widowed    Spouse name: Not on file  . Number of children: Not on file  . Years of education: Not on file  . Highest education level: Not on file  Occupational History  . Not on file  Tobacco Use  . Smoking status: Never Smoker  . Smokeless tobacco: Never Used  . Tobacco comment: Patient reports extensive passive tobacco smoke exposure  Substance and Sexual Activity  . Alcohol use: No  . Drug use: No  . Sexual activity: Never  Other  Topics Concern  . Not on file  Social History Narrative  . Not on file   Social Determinants of Health   Financial Resource Strain:   . Difficulty of Paying Living Expenses: Not on file  Food Insecurity:   . Worried About Charity fundraiser in the Last Year: Not on file  . Ran Out of Food in the Last Year: Not on file  Transportation Needs:   . Lack of Transportation (Medical): Not on file  . Lack of Transportation (Non-Medical): Not on file  Physical Activity:   . Days of Exercise per Week: Not on file  . Minutes of Exercise per Session: Not on file  Stress:   . Feeling of Stress : Not on file  Social Connections:   . Frequency of Communication with Friends and Family: Not on file  . Frequency of Social Gatherings with Friends and Family: Not on file  . Attends Religious Services: Not on file  . Active Member of Clubs or Organizations: Not on file  . Attends Archivist Meetings: Not on file  . Marital Status: Not on file     Family History: The patient's family history includes Acute myelogenous leukemia in her sister; Breast cancer in her cousin; Congestive Heart Failure in her mother; DES usage in her mother; Hypertension in her father; Kidney disease in her father and mother; Sickle cell trait in her daughter.  ROS:   Please see the history of present illness.    She is looking forward to having COVID-19 vaccine.  Xarelto dose has been decreased by mutual agreement between Dr. Marlou Anderson and her.  She initiated the request to decrease the dose.  There were no pre-existing bleeding complications.  Last pulmonary embolism has been greater than 3 years ago.  All other systems reviewed and are negative.  EKGs/Labs/Other Studies Reviewed:    The following studies were reviewed today: No new data  EKG:  EKG normal sinus rhythm with normal appearance.  When compared to the prior tracing of 03/13/2018, no change has occurred.  Recent Labs: No results found for requested  labs within last 8760 hours.  Recent Lipid Panel    Component Value Date/Time   CHOL 153 01/27/2017 0856   TRIG 86 01/27/2017 0856   HDL 60 01/27/2017 0856   CHOLHDL 2.6 01/27/2017 0856   VLDL 17 01/27/2017 0856   LDLCALC 76 01/27/2017 0856    Physical Exam:    VS:  Ht 5\' 3"  (1.6 m)   BMI 31.18 kg/m     Wt Readings from Last 3 Encounters:  09/02/19 176 lb (79.8 kg)  06/30/19 178 lb (80.7 kg)  09/27/18 188 lb (85.3 kg)     GEN: Appears to have  lost significant weight.. No acute distress HEENT: Normal NECK: No JVD. LYMPHATICS: No lymphadenopathy CARDIAC:  RRR without murmur, gallop, or edema. VASCULAR:  Normal Pulses. No bruits. RESPIRATORY:  Clear to auscultation without rales, wheezing or rhonchi  ABDOMEN: Soft, non-tender, non-distended, No pulsatile mass, MUSCULOSKELETAL: No deformity  SKIN: Warm and dry NEUROLOGIC:  Alert and oriented x 3 PSYCHIATRIC:  Normal affect   ASSESSMENT:    1. Essential hypertension   2. Hypertensive left ventricular hypertrophy, without heart failure   3. Type 2 diabetes mellitus with complication, without long-term current use of insulin ([place])   4. Mixed connective tissue disease ([place])   5. Bilateral pulmonary embolism ([place])   6. Educated about COVID-19 virus infection    PLAN:    In order of problems listed above:  1. The blood pressure is well controlled. 2. No symptoms to suggest volume overload or diastolic heart failure. 3. Not discussed 4. Controlled currently 5. On prophylactic anticoagulant therapy, reduced dose per shared decision making by the patient and Dr. Marlou Anderson. 6. COVID-19 vaccine and 3W's discussed and endorsed by the patient.     Medication Adjustments/Labs and Tests Ordered: Current medicines are reviewed at length with the patient today.  Concerns regarding medicines are outlined above.  Orders Placed This Encounter  Procedures  . EKG 12-Lead   No orders of the defined types were placed in this  encounter.   Patient Instructions  Medication Instructions:  Your physician recommends that you continue on your current medications as directed. Please refer to the Current Medication list given to you today.  *If you need a refill on your cardiac medications before your next appointment, please call your pharmacy*  Lab Work: None If you have labs (blood work) drawn today and your tests are completely normal, you will receive your results only by: Marland Kitchen MyChart Message (if you have MyChart) OR . A paper copy in the mail If you have any lab test that is abnormal or we need to change your treatment, we will call you to review the results.  Testing/Procedures: None  Follow-Up: At [facility], you and your health needs are our priority.  As part of our continuing mission to provide you with exceptional heart care, we have created designated Provider Care Teams.  These Care Teams include your primary Cardiologist (physician) and Advanced Practice Providers (APPs -  Physician Assistants and Nurse Practitioners) who all work together to provide you with the care you need, when you need it.  Your next appointment:   12 month(s)  The format for your next appointment:   In Person  Provider:   You may see Dr. Daneen Schick or one of the following Advanced Practice Providers on your designated Care Team:    Truitt Merle, NP  Cecilie Kicks, NP  Kathyrn Drown, NP   Other Instructions      Signed, Sinclair Grooms, MD  09/15/2019 5:04 PM    [place]

## 2019-09-15 ENCOUNTER — Ambulatory Visit (INDEPENDENT_AMBULATORY_CARE_PROVIDER_SITE_OTHER): Payer: BC Managed Care – PPO | Admitting: Interventional Cardiology

## 2019-09-15 ENCOUNTER — Encounter: Payer: Self-pay | Admitting: Interventional Cardiology

## 2019-09-15 ENCOUNTER — Other Ambulatory Visit: Payer: Self-pay

## 2019-09-15 VITALS — BP 124/70 | HR 80 | Ht 63.0 in | Wt 175.6 lb

## 2019-09-15 DIAGNOSIS — M351 Other overlap syndromes: Secondary | ICD-10-CM | POA: Diagnosis not present

## 2019-09-15 DIAGNOSIS — Z7189 Other specified counseling: Secondary | ICD-10-CM

## 2019-09-15 DIAGNOSIS — E118 Type 2 diabetes mellitus with unspecified complications: Secondary | ICD-10-CM | POA: Diagnosis not present

## 2019-09-15 DIAGNOSIS — I10 Essential (primary) hypertension: Secondary | ICD-10-CM

## 2019-09-15 DIAGNOSIS — I2699 Other pulmonary embolism without acute cor pulmonale: Secondary | ICD-10-CM

## 2019-09-15 DIAGNOSIS — I119 Hypertensive heart disease without heart failure: Secondary | ICD-10-CM | POA: Diagnosis not present

## 2019-09-15 NOTE — Patient Instructions (Signed)
Medication Instructions:  Your physician recommends that you continue on your current medications as directed. Please refer to the Current Medication list given to you today.  *If you need a refill on your cardiac medications before your next appointment, please call your pharmacy*  Lab Work: None If you have labs (blood work) drawn today and your tests are completely normal, you will receive your results only by: . MyChart Message (if you have MyChart) OR . A paper copy in the mail If you have any lab test that is abnormal or we need to change your treatment, we will call you to review the results.  Testing/Procedures: None  Follow-Up: At [facility], you and your health needs are our priority.  As part of our continuing mission to provide you with exceptional heart care, we have created designated Provider Care Teams.  These Care Teams include your primary Cardiologist (physician) and Advanced Practice Providers (APPs -  Physician Assistants and Nurse Practitioners) who all work together to provide you with the care you need, when you need it.  Your next appointment:   12 month(s)  The format for your next appointment:   In Person  Provider:   You may see Dr. Henry Smith or one of the following Advanced Practice Providers on your designated Care Team:    Lori Gerhardt, NP  Laura Ingold, NP  Jill McDaniel, NP   Other Instructions   

## 2019-09-17 ENCOUNTER — Other Ambulatory Visit: Payer: Self-pay | Admitting: Internal Medicine

## 2019-09-17 DIAGNOSIS — Z1231 Encounter for screening mammogram for malignant neoplasm of breast: Secondary | ICD-10-CM

## 2019-09-21 ENCOUNTER — Other Ambulatory Visit: Payer: Self-pay | Admitting: Interventional Cardiology

## 2019-10-22 ENCOUNTER — Ambulatory Visit
Admission: RE | Admit: 2019-10-22 | Discharge: 2019-10-22 | Disposition: A | Discharge status: Home / Self Care | Payer: BC Managed Care – PPO | Source: Ambulatory Visit | Attending: Internal Medicine | Admitting: Internal Medicine

## 2019-10-22 ENCOUNTER — Other Ambulatory Visit: Payer: Self-pay

## 2019-10-22 DIAGNOSIS — Z1231 Encounter for screening mammogram for malignant neoplasm of breast: Secondary | ICD-10-CM

## 2019-11-12 ENCOUNTER — Ambulatory Visit: Payer: BC Managed Care – PPO | Attending: Family

## 2019-11-12 DIAGNOSIS — Z23 Encounter for immunization: Secondary | ICD-10-CM

## 2019-11-12 NOTE — Progress Notes (Signed)
   Covid-19 Vaccination Clinic  Name:  [name]    MRN: [id number] DOB: 05-12-1956  11/12/2019  [name] was observed post Covid-19 immunization for 15 minutes without incident. She was provided with Vaccine Information Sheet and instruction to access the V-Safe system.   [name] was instructed to call 911 with any severe reactions post vaccine: Marland Kitchen Difficulty breathing  . Swelling of face and throat  . A fast heartbeat  . A bad rash all over body  . Dizziness and weakness   Immunizations Administered    Name Date Dose VIS Date Route   Moderna COVID-19 Vaccine 11/12/2019  3:15 PM 0.5 mL 07/14/2019 Intramuscular   Manufacturer: Moderna   Lot: [id number]   [id number]

## 2019-12-15 ENCOUNTER — Ambulatory Visit: Payer: BC Managed Care – PPO | Attending: Family

## 2019-12-15 DIAGNOSIS — Z23 Encounter for immunization: Secondary | ICD-10-CM

## 2019-12-15 NOTE — Progress Notes (Signed)
   Covid-19 Vaccination Clinic  Name:  [name]    MRN: [id number] DOB: 01/29/1956  12/15/2019  [name] was observed post Covid-19 immunization for 15 minutes without incident. She was provided with Vaccine Information Sheet and instruction to access the V-Safe system.   [name] was instructed to call 911 with any severe reactions post vaccine: Marland Kitchen Difficulty breathing  . Swelling of face and throat  . A fast heartbeat  . A bad rash all over body  . Dizziness and weakness   Immunizations Administered    Name Date Dose VIS Date Route   Moderna COVID-19 Vaccine 12/15/2019  3:17 PM 0.5 mL 07/2019 Intramuscular   Manufacturer: Moderna   Lot: [id number]   [id number]

## 2020-01-01 ENCOUNTER — Other Ambulatory Visit: Payer: Self-pay | Admitting: Orthopedic Surgery

## 2020-01-01 DIAGNOSIS — M25531 Pain in right wrist: Secondary | ICD-10-CM

## 2020-02-01 ENCOUNTER — Inpatient Hospital Stay: Admission: RE | Admit: 2020-02-01 | Payer: BC Managed Care – PPO | Source: Ambulatory Visit

## 2020-02-01 ENCOUNTER — Other Ambulatory Visit: Payer: BC Managed Care – PPO

## 2020-03-04 ENCOUNTER — Other Ambulatory Visit: Payer: BC Managed Care – PPO

## 2020-03-12 IMAGING — MG DIGITAL SCREENING BILAT W/ TOMO W/ CAD
8 series · 8 of 24 positions shown · non-contrast
Comparison: Previous exam(s).

CLINICAL DATA: Screening.

EXAM:
DIGITAL SCREENING BILATERAL MAMMOGRAM WITH TOMO AND CAD

[R CC synth-2D]
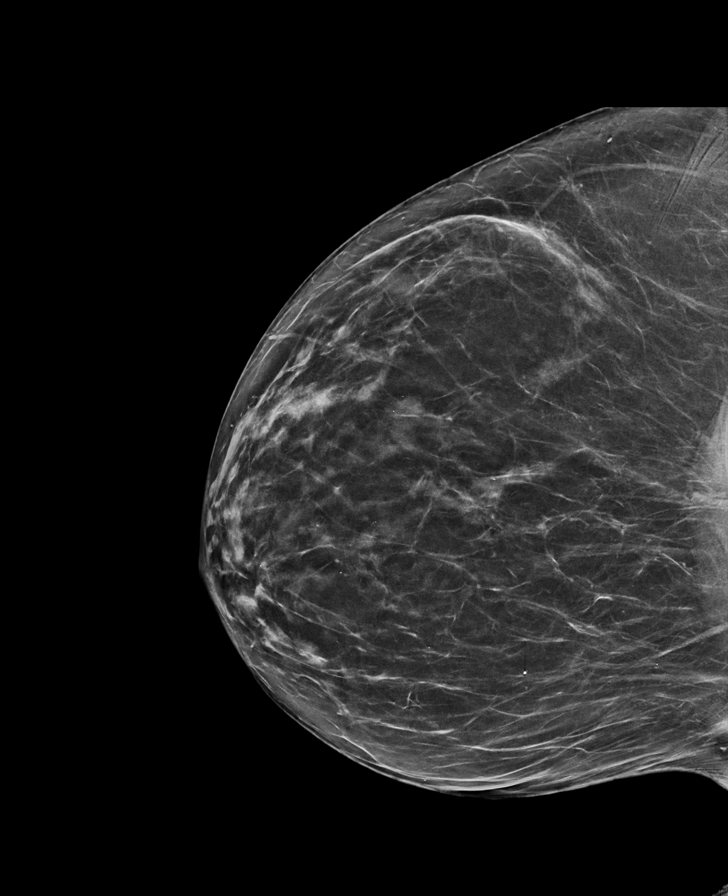

[L CC synth-2D]
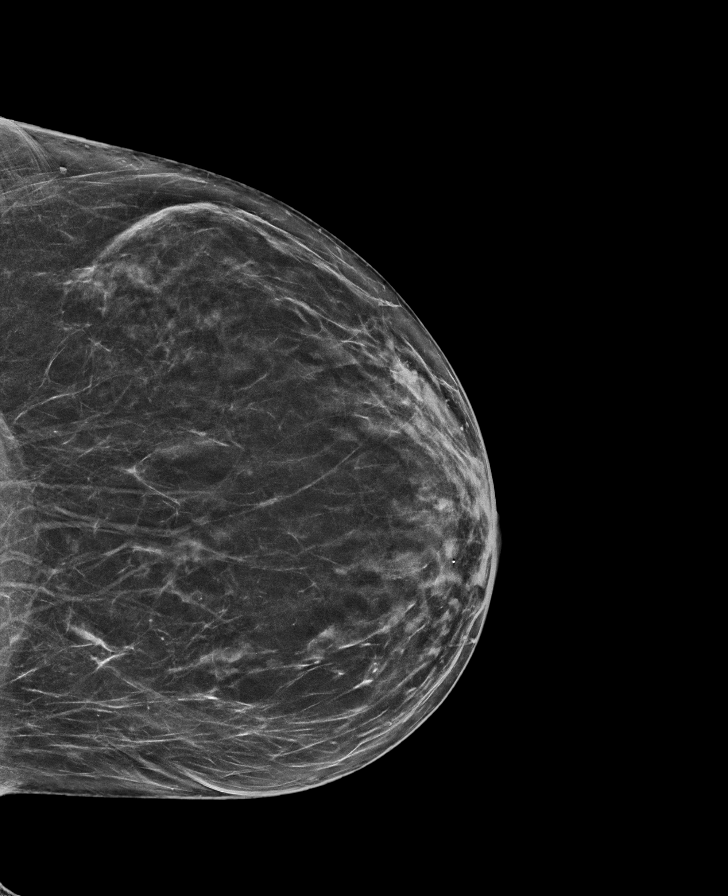

[L MLO synth-2D]
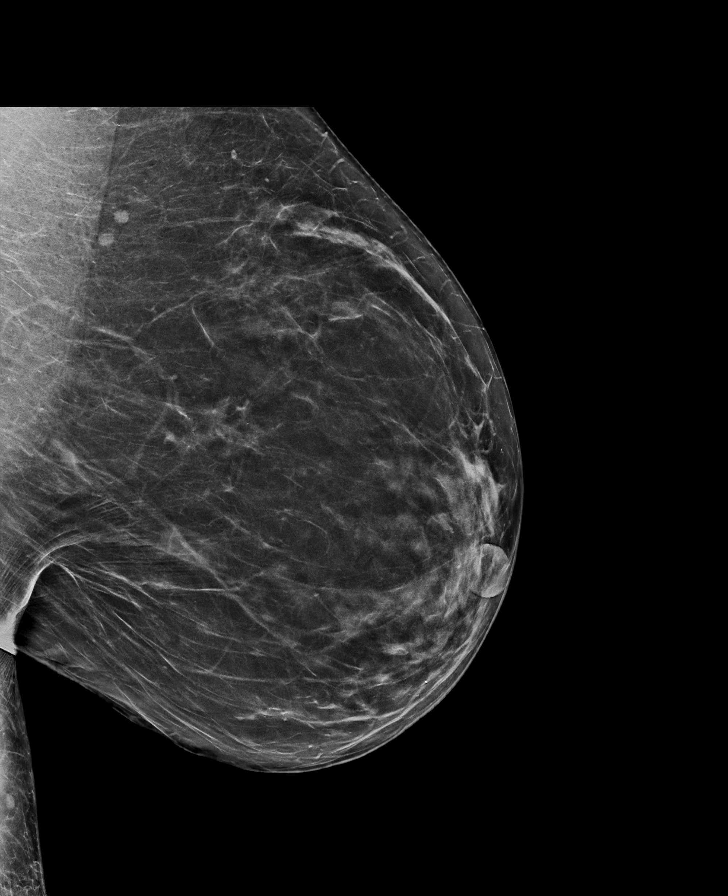

[R MLO synth-2D]
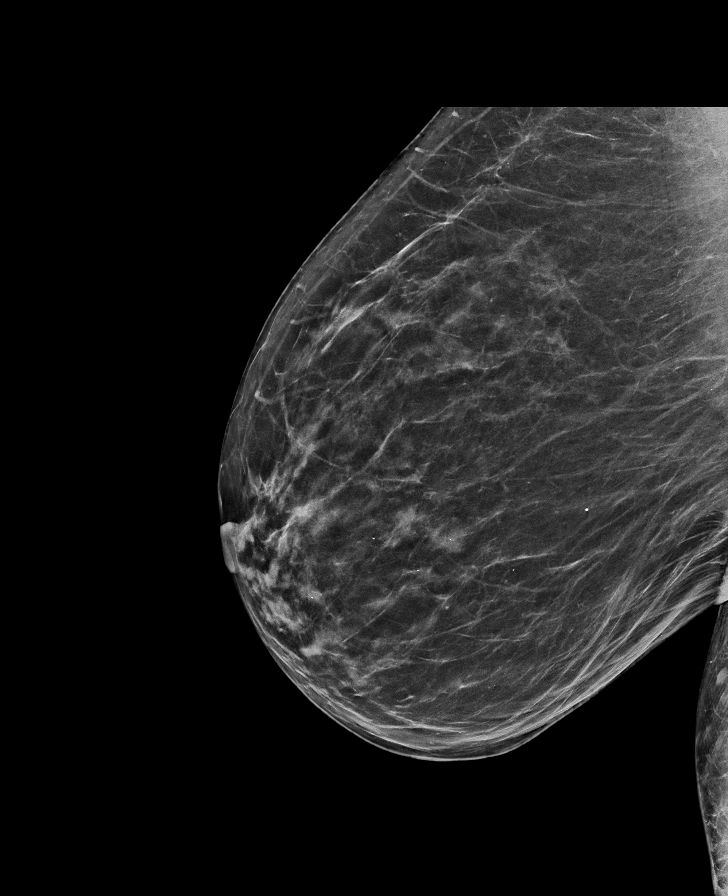

[R MLO tomo · tomo slice 35/70.0]
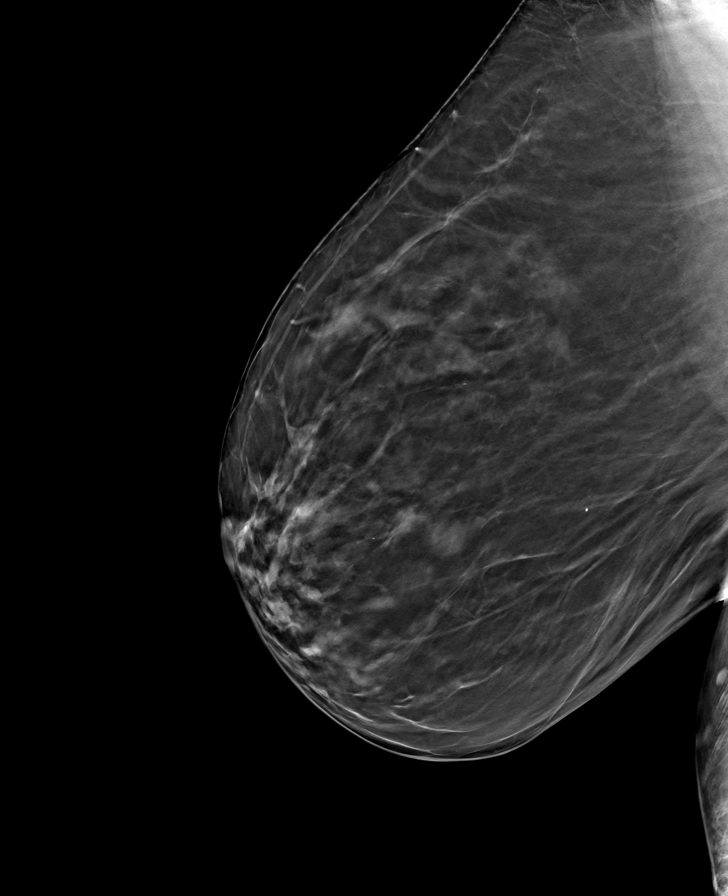

[R CC tomo · tomo slice 35/68.0]
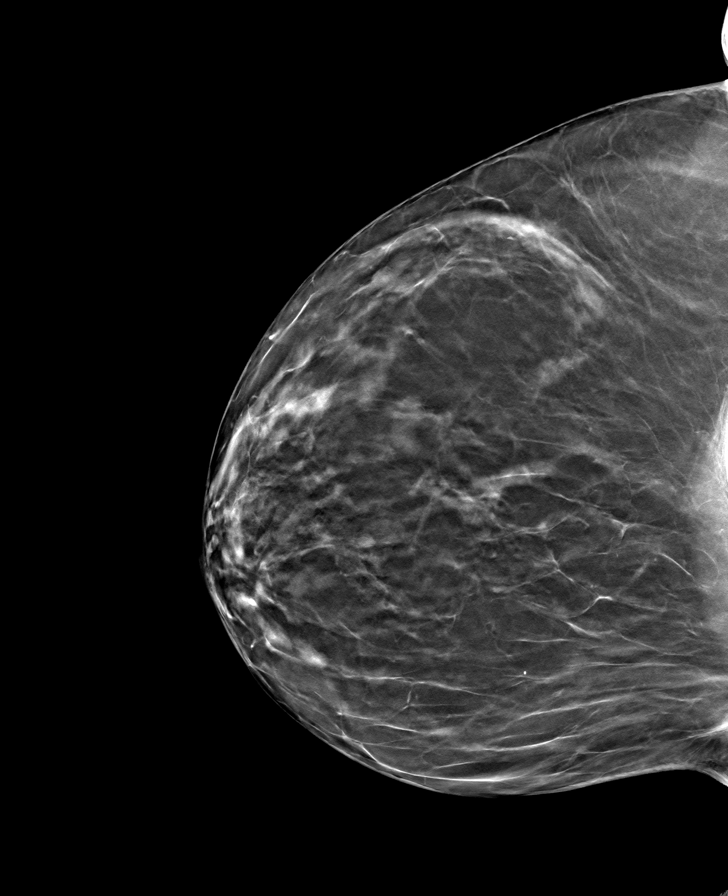

[L MLO tomo · tomo slice 37/73.0]
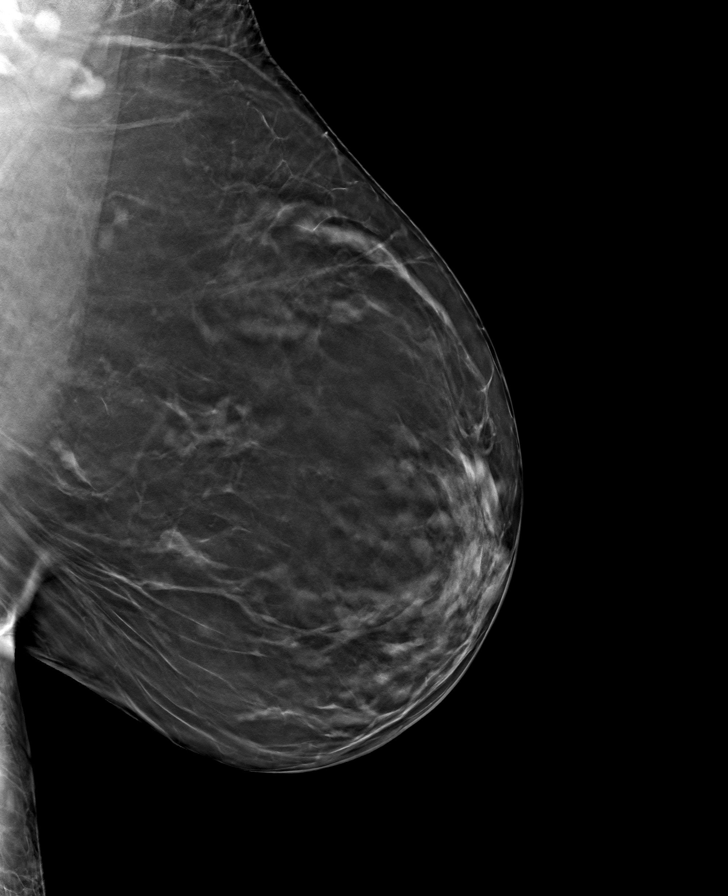

[L CC tomo · tomo slice 34/67.0]
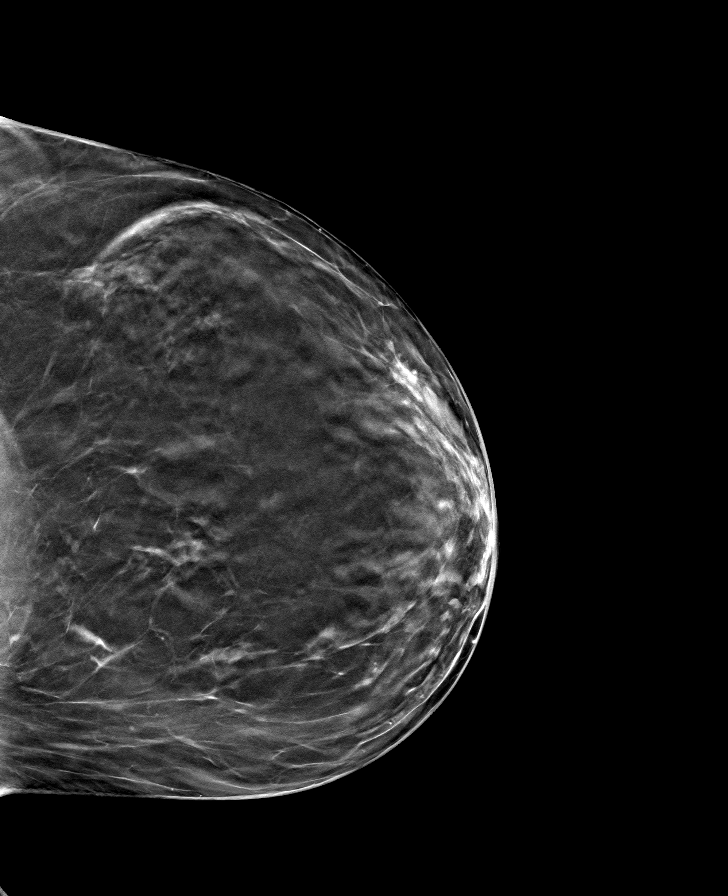

[8 of 24 positions shown; findings below may reference images not displayed]

ACR Breast Density Category b: There are scattered areas of
fibroglandular density.
FINDINGS: There are no findings suspicious for malignancy. Images were
processed with CAD.
IMPRESSION: No mammographic evidence of malignancy. A result letter of this
screening mammogram will be mailed directly to the patient.

RECOMMENDATION:
Screening mammogram in one year. (Code:CN-U-775)

BI-RADS CATEGORY  1: Negative.

## 2020-06-28 ENCOUNTER — Inpatient Hospital Stay: Payer: BC Managed Care – PPO | Attending: Oncology | Admitting: Oncology

## 2020-06-28 ENCOUNTER — Other Ambulatory Visit: Payer: Self-pay

## 2020-06-28 VITALS — BP 124/72 | HR 88 | Temp 97.2°F | Resp 16 | Ht 63.0 in | Wt 179.4 lb

## 2020-06-28 DIAGNOSIS — E1140 Type 2 diabetes mellitus with diabetic neuropathy, unspecified: Secondary | ICD-10-CM | POA: Diagnosis not present

## 2020-06-28 DIAGNOSIS — I2699 Other pulmonary embolism without acute cor pulmonale: Secondary | ICD-10-CM | POA: Diagnosis not present

## 2020-06-28 DIAGNOSIS — Z7901 Long term (current) use of anticoagulants: Secondary | ICD-10-CM | POA: Diagnosis not present

## 2020-06-28 DIAGNOSIS — Z86711 Personal history of pulmonary embolism: Secondary | ICD-10-CM | POA: Diagnosis present

## 2020-06-28 DIAGNOSIS — D6862 Lupus anticoagulant syndrome: Secondary | ICD-10-CM | POA: Diagnosis not present

## 2020-06-28 DIAGNOSIS — R000 Tachycardia, unspecified: Secondary | ICD-10-CM | POA: Insufficient documentation

## 2020-06-28 NOTE — Progress Notes (Signed)
  [facility] OFFICE PROGRESS NOTE   Diagnosis: History of pulmonary embolism  INTERVAL HISTORY:   Ms. Labella returns for a scheduled visit.  She continues Xarelto anticoagulation.  No bleeding or symptom of recurrent thrombosis.  She reports left facial pain and tenderness since receiving the second dose of the COVID-19 vaccine in May.  She has been evaluated by Dr. Marlou Sa, pulmonary medicine, and a neurologist with no clear explanation for the pain.  She reports they feel she may have "trigeminal neuralgia ". She reports swollen lymph nodes in the bilateral axillae several weeks ago.  These have resolved.  She feels the swelling was related to lymph nodes as opposed to hidradenitis. Objective:  Vital signs in last 24 hours:  Blood pressure 124/72, pulse 88, temperature (!) 97.2 F (36.2 C), temperature source Tympanic, resp. rate 16, height 5\' 3"  (1.6 m), weight 179 lb 6.4 oz (81.4 kg), SpO2 100 %.    HEENT: Tenderness at the left forehead, periorbital region, and left cheek Lymphatics: No cervical, supraclavicular, or axillary nodes.  Examination of the axillary area bilaterally is unremarkable Resp: Lungs clear bilaterally Cardio: Regular rate and rhythm GI: No hepatosplenomegaly Vascular: No leg edema   Lab Results:  Lab Results  Component Value Date   WBC 4.7 05/30/2017   HGB 13.7 05/30/2017   HCT 41.2 05/30/2017   MCV 91.4 05/30/2017   PLT 359 05/30/2017   NEUTROABS 2.1 05/30/2017    CMP  Lab Results  Component Value Date   NA 140 05/30/2017   K 4.1 05/30/2017   CL 99 05/29/2017   CO2 27 05/30/2017   GLUCOSE 91 05/30/2017   BUN 10.9 05/30/2017   CREATININE 1.1 05/30/2017   CALCIUM 9.7 05/30/2017   PROT 7.6 06/13/2017   ALBUMIN 4.4 05/30/2017   AST 17 05/30/2017   ALT 15 05/30/2017   ALKPHOS 90 05/30/2017   BILITOT 0.49 05/30/2017   GFRNONAA 55 (L) 05/29/2017   GFRAA 64 05/29/2017     Medications: I have reviewed the patient's current  medications.   Assessment/Plan: 1. Recurrent pulmonary embolism  November 2012-multiple bilateral segmental pulmonary emboli, Dopplers negative for deep vein thrombosis, negative thrombophilia panel  Treated with warfarin for 3 years, then anticoagulation discontinued  June 2018-bilateral lobar pulmonary emboli with right heart strain, Dopplers negative for deep vein thrombosis, treatment started with rivaroxaban  05/30/2017- DRVVT positive for a lupus anticoagulant, was maintained on rivaroxaban, no other abnormality  Rivaroxaban dose decreased to 15 mg daily secondary to easy bruising  2.  Diabetes 3.  Asthma 4.  G1, P1 5.  "Tachycardia " 6.   Neuropathy 7.  Hysterectomy    Disposition: Ms. Soltys is maintained on indefinite anticoagulation therapy for treatment of recurrent pulmonary embolism.  There was no defined risk for the pulmonary emboli.  She will continue indefinite anticoagulation therapy.  She is most comfortable continuing Xarelto at the current dose.  She plans to continue clinical follow-up with Dr. Marlou Sa.  I am available to see her in the future as needed.  Betsy Coder, MD  06/28/2020  3:30 PM

## 2020-07-19 ENCOUNTER — Other Ambulatory Visit: Payer: Self-pay | Admitting: Interventional Cardiology

## 2020-09-27 ENCOUNTER — Other Ambulatory Visit: Payer: Self-pay | Admitting: Internal Medicine

## 2020-09-27 DIAGNOSIS — Z0000 Encounter for general adult medical examination without abnormal findings: Secondary | ICD-10-CM

## 2020-11-16 ENCOUNTER — Ambulatory Visit
Admission: RE | Admit: 2020-11-16 | Discharge: 2020-11-16 | Disposition: A | Discharge status: Home / Self Care | Payer: BC Managed Care – PPO | Source: Ambulatory Visit | Attending: Internal Medicine | Admitting: Internal Medicine

## 2020-11-16 ENCOUNTER — Other Ambulatory Visit: Payer: Self-pay

## 2020-11-16 DIAGNOSIS — Z0000 Encounter for general adult medical examination without abnormal findings: Secondary | ICD-10-CM

## 2020-11-28 ENCOUNTER — Other Ambulatory Visit: Payer: Self-pay | Admitting: Interventional Cardiology

## 2021-10-16 ENCOUNTER — Other Ambulatory Visit: Payer: Self-pay | Admitting: Internal Medicine

## 2021-10-16 DIAGNOSIS — Z1231 Encounter for screening mammogram for malignant neoplasm of breast: Secondary | ICD-10-CM

## 2021-11-17 ENCOUNTER — Ambulatory Visit
Admission: RE | Admit: 2021-11-17 | Discharge: 2021-11-17 | Disposition: A | Discharge status: Home / Self Care | Payer: BC Managed Care – PPO | Source: Ambulatory Visit | Attending: Internal Medicine | Admitting: Internal Medicine

## 2021-11-17 DIAGNOSIS — Z1231 Encounter for screening mammogram for malignant neoplasm of breast: Secondary | ICD-10-CM

## 2022-10-15 ENCOUNTER — Other Ambulatory Visit: Payer: Self-pay | Admitting: Internal Medicine

## 2022-10-15 DIAGNOSIS — Z1231 Encounter for screening mammogram for malignant neoplasm of breast: Secondary | ICD-10-CM

## 2022-11-28 ENCOUNTER — Ambulatory Visit
Admission: RE | Admit: 2022-11-28 | Discharge: 2022-11-28 | Disposition: A | Discharge status: Home / Self Care | Payer: Medicare PPO | Source: Ambulatory Visit | Attending: Internal Medicine | Admitting: Internal Medicine

## 2022-11-28 DIAGNOSIS — Z1231 Encounter for screening mammogram for malignant neoplasm of breast: Secondary | ICD-10-CM

## 2022-12-05 LAB — HM DIABETES EYE EXAM

## 2023-01-11 ENCOUNTER — Other Ambulatory Visit (HOSPITAL_COMMUNITY): Payer: Self-pay | Admitting: Internal Medicine

## 2023-01-11 ENCOUNTER — Ambulatory Visit (HOSPITAL_COMMUNITY)
Admission: RE | Admit: 2023-01-11 | Discharge: 2023-01-11 | Disposition: A | Discharge status: Home / Self Care | Payer: Medicare PPO | Source: Ambulatory Visit | Attending: Vascular Surgery | Admitting: Vascular Surgery

## 2023-01-11 DIAGNOSIS — R609 Edema, unspecified: Secondary | ICD-10-CM | POA: Insufficient documentation

## 2023-09-09 DIAGNOSIS — J320 Chronic maxillary sinusitis: Secondary | ICD-10-CM | POA: Diagnosis not present

## 2023-10-22 DIAGNOSIS — E119 Type 2 diabetes mellitus without complications: Secondary | ICD-10-CM | POA: Diagnosis not present

## 2023-10-22 DIAGNOSIS — I2699 Other pulmonary embolism without acute cor pulmonale: Secondary | ICD-10-CM | POA: Diagnosis not present

## 2023-10-22 DIAGNOSIS — I10 Essential (primary) hypertension: Secondary | ICD-10-CM | POA: Diagnosis not present

## 2023-10-22 DIAGNOSIS — E782 Mixed hyperlipidemia: Secondary | ICD-10-CM | POA: Diagnosis not present

## 2023-10-25 ENCOUNTER — Other Ambulatory Visit: Payer: Self-pay | Admitting: Internal Medicine

## 2023-10-25 DIAGNOSIS — Z0000 Encounter for general adult medical examination without abnormal findings: Secondary | ICD-10-CM

## 2023-11-04 DIAGNOSIS — R0789 Other chest pain: Secondary | ICD-10-CM | POA: Diagnosis not present

## 2023-11-04 DIAGNOSIS — Z0184 Encounter for antibody response examination: Secondary | ICD-10-CM | POA: Diagnosis not present

## 2023-11-04 DIAGNOSIS — Z86711 Personal history of pulmonary embolism: Secondary | ICD-10-CM | POA: Diagnosis not present

## 2023-11-04 DIAGNOSIS — E559 Vitamin D deficiency, unspecified: Secondary | ICD-10-CM | POA: Diagnosis not present

## 2023-11-04 DIAGNOSIS — E119 Type 2 diabetes mellitus without complications: Secondary | ICD-10-CM | POA: Diagnosis not present

## 2023-11-04 DIAGNOSIS — I119 Hypertensive heart disease without heart failure: Secondary | ICD-10-CM | POA: Diagnosis not present

## 2023-11-04 DIAGNOSIS — K8681 Exocrine pancreatic insufficiency: Secondary | ICD-10-CM | POA: Diagnosis not present

## 2023-11-04 DIAGNOSIS — N1831 Chronic kidney disease, stage 3a: Secondary | ICD-10-CM | POA: Diagnosis not present

## 2023-11-04 DIAGNOSIS — J328 Other chronic sinusitis: Secondary | ICD-10-CM | POA: Diagnosis not present

## 2023-11-26 DIAGNOSIS — K219 Gastro-esophageal reflux disease without esophagitis: Secondary | ICD-10-CM | POA: Diagnosis not present

## 2023-11-26 DIAGNOSIS — J301 Allergic rhinitis due to pollen: Secondary | ICD-10-CM | POA: Diagnosis not present

## 2023-11-26 DIAGNOSIS — G4733 Obstructive sleep apnea (adult) (pediatric): Secondary | ICD-10-CM | POA: Diagnosis not present

## 2023-11-26 DIAGNOSIS — J8410 Pulmonary fibrosis, unspecified: Secondary | ICD-10-CM | POA: Diagnosis not present

## 2023-11-26 DIAGNOSIS — K8681 Exocrine pancreatic insufficiency: Secondary | ICD-10-CM | POA: Diagnosis not present

## 2023-11-26 DIAGNOSIS — E785 Hyperlipidemia, unspecified: Secondary | ICD-10-CM | POA: Diagnosis not present

## 2023-11-26 DIAGNOSIS — N1831 Chronic kidney disease, stage 3a: Secondary | ICD-10-CM | POA: Diagnosis not present

## 2023-11-26 DIAGNOSIS — I272 Pulmonary hypertension, unspecified: Secondary | ICD-10-CM | POA: Diagnosis not present

## 2023-11-26 DIAGNOSIS — I129 Hypertensive chronic kidney disease with stage 1 through stage 4 chronic kidney disease, or unspecified chronic kidney disease: Secondary | ICD-10-CM | POA: Diagnosis not present

## 2023-11-29 ENCOUNTER — Ambulatory Visit
Admission: RE | Admit: 2023-11-29 | Discharge: 2023-11-29 | Disposition: A | Discharge status: Home / Self Care | Source: Ambulatory Visit | Attending: Internal Medicine | Admitting: Internal Medicine

## 2023-11-29 DIAGNOSIS — Z1231 Encounter for screening mammogram for malignant neoplasm of breast: Secondary | ICD-10-CM | POA: Diagnosis not present

## 2023-11-29 DIAGNOSIS — Z0000 Encounter for general adult medical examination without abnormal findings: Secondary | ICD-10-CM

## 2023-12-05 DIAGNOSIS — N1831 Chronic kidney disease, stage 3a: Secondary | ICD-10-CM | POA: Diagnosis not present

## 2023-12-05 DIAGNOSIS — Z86711 Personal history of pulmonary embolism: Secondary | ICD-10-CM | POA: Diagnosis not present

## 2023-12-05 DIAGNOSIS — J328 Other chronic sinusitis: Secondary | ICD-10-CM | POA: Diagnosis not present

## 2023-12-05 DIAGNOSIS — R0789 Other chest pain: Secondary | ICD-10-CM | POA: Diagnosis not present

## 2023-12-05 DIAGNOSIS — E119 Type 2 diabetes mellitus without complications: Secondary | ICD-10-CM | POA: Diagnosis not present

## 2023-12-05 DIAGNOSIS — K8681 Exocrine pancreatic insufficiency: Secondary | ICD-10-CM | POA: Diagnosis not present

## 2023-12-05 DIAGNOSIS — I119 Hypertensive heart disease without heart failure: Secondary | ICD-10-CM | POA: Diagnosis not present

## 2023-12-27 DIAGNOSIS — E119 Type 2 diabetes mellitus without complications: Secondary | ICD-10-CM | POA: Diagnosis not present

## 2023-12-27 DIAGNOSIS — Z112 Encounter for screening for other bacterial diseases: Secondary | ICD-10-CM | POA: Diagnosis not present

## 2023-12-27 DIAGNOSIS — J069 Acute upper respiratory infection, unspecified: Secondary | ICD-10-CM | POA: Diagnosis not present

## 2023-12-27 DIAGNOSIS — H65192 Other acute nonsuppurative otitis media, left ear: Secondary | ICD-10-CM | POA: Diagnosis not present

## 2023-12-27 DIAGNOSIS — J329 Chronic sinusitis, unspecified: Secondary | ICD-10-CM | POA: Diagnosis not present

## 2023-12-27 DIAGNOSIS — J328 Other chronic sinusitis: Secondary | ICD-10-CM | POA: Diagnosis not present

## 2023-12-27 DIAGNOSIS — J0101 Acute recurrent maxillary sinusitis: Secondary | ICD-10-CM | POA: Diagnosis not present

## 2024-01-16 ENCOUNTER — Encounter: Payer: Self-pay | Admitting: Internal Medicine

## 2024-01-16 DIAGNOSIS — Z86711 Personal history of pulmonary embolism: Secondary | ICD-10-CM | POA: Diagnosis not present

## 2024-01-16 DIAGNOSIS — R0789 Other chest pain: Secondary | ICD-10-CM | POA: Diagnosis not present

## 2024-01-16 DIAGNOSIS — K8681 Exocrine pancreatic insufficiency: Secondary | ICD-10-CM | POA: Diagnosis not present

## 2024-01-16 DIAGNOSIS — E119 Type 2 diabetes mellitus without complications: Secondary | ICD-10-CM | POA: Diagnosis not present

## 2024-01-16 DIAGNOSIS — J328 Other chronic sinusitis: Secondary | ICD-10-CM | POA: Diagnosis not present

## 2024-01-16 DIAGNOSIS — M791 Myalgia, unspecified site: Secondary | ICD-10-CM | POA: Diagnosis not present

## 2024-01-16 DIAGNOSIS — R609 Edema, unspecified: Secondary | ICD-10-CM | POA: Diagnosis not present

## 2024-01-16 DIAGNOSIS — I119 Hypertensive heart disease without heart failure: Secondary | ICD-10-CM | POA: Diagnosis not present

## 2024-01-16 DIAGNOSIS — M79A21 Nontraumatic compartment syndrome of right lower extremity: Secondary | ICD-10-CM | POA: Diagnosis not present

## 2024-01-16 DIAGNOSIS — N1831 Chronic kidney disease, stage 3a: Secondary | ICD-10-CM | POA: Diagnosis not present

## 2024-01-17 ENCOUNTER — Encounter: Payer: Self-pay | Admitting: Internal Medicine

## 2024-01-17 DIAGNOSIS — M79661 Pain in right lower leg: Secondary | ICD-10-CM

## 2024-01-29 DIAGNOSIS — E119 Type 2 diabetes mellitus without complications: Secondary | ICD-10-CM | POA: Diagnosis not present

## 2024-01-29 DIAGNOSIS — I2699 Other pulmonary embolism without acute cor pulmonale: Secondary | ICD-10-CM | POA: Diagnosis not present

## 2024-01-29 DIAGNOSIS — I10 Essential (primary) hypertension: Secondary | ICD-10-CM | POA: Diagnosis not present

## 2024-01-29 DIAGNOSIS — E782 Mixed hyperlipidemia: Secondary | ICD-10-CM | POA: Diagnosis not present

## 2024-01-30 ENCOUNTER — Other Ambulatory Visit: Payer: Self-pay | Admitting: Internal Medicine

## 2024-01-30 DIAGNOSIS — R2241 Localized swelling, mass and lump, right lower limb: Secondary | ICD-10-CM

## 2024-02-03 ENCOUNTER — Ambulatory Visit
Admission: RE | Admit: 2024-02-03 | Discharge: 2024-02-03 | Disposition: A | Discharge status: Home / Self Care | Source: Ambulatory Visit | Attending: Internal Medicine | Admitting: Internal Medicine

## 2024-02-03 DIAGNOSIS — R2241 Localized swelling, mass and lump, right lower limb: Secondary | ICD-10-CM | POA: Diagnosis not present

## 2024-02-28 ENCOUNTER — Encounter: Payer: Self-pay | Admitting: Advanced Practice Midwife

## 2024-03-02 DIAGNOSIS — Z86711 Personal history of pulmonary embolism: Secondary | ICD-10-CM | POA: Diagnosis not present

## 2024-03-02 DIAGNOSIS — K8681 Exocrine pancreatic insufficiency: Secondary | ICD-10-CM | POA: Diagnosis not present

## 2024-03-02 DIAGNOSIS — E119 Type 2 diabetes mellitus without complications: Secondary | ICD-10-CM | POA: Diagnosis not present

## 2024-03-02 DIAGNOSIS — I119 Hypertensive heart disease without heart failure: Secondary | ICD-10-CM | POA: Diagnosis not present

## 2024-03-02 DIAGNOSIS — N1831 Chronic kidney disease, stage 3a: Secondary | ICD-10-CM | POA: Diagnosis not present

## 2024-03-02 DIAGNOSIS — M79A21 Nontraumatic compartment syndrome of right lower extremity: Secondary | ICD-10-CM | POA: Diagnosis not present

## 2024-03-02 DIAGNOSIS — J328 Other chronic sinusitis: Secondary | ICD-10-CM | POA: Diagnosis not present

## 2024-03-02 DIAGNOSIS — R0789 Other chest pain: Secondary | ICD-10-CM | POA: Diagnosis not present

## 2024-03-02 DIAGNOSIS — M791 Myalgia, unspecified site: Secondary | ICD-10-CM | POA: Diagnosis not present

## 2024-03-04 ENCOUNTER — Encounter: Payer: Self-pay | Admitting: Internal Medicine

## 2024-03-04 DIAGNOSIS — E119 Type 2 diabetes mellitus without complications: Secondary | ICD-10-CM | POA: Diagnosis not present

## 2024-03-04 DIAGNOSIS — H25013 Cortical age-related cataract, bilateral: Secondary | ICD-10-CM | POA: Diagnosis not present

## 2024-03-04 DIAGNOSIS — H2513 Age-related nuclear cataract, bilateral: Secondary | ICD-10-CM | POA: Diagnosis not present

## 2024-03-04 DIAGNOSIS — H43822 Vitreomacular adhesion, left eye: Secondary | ICD-10-CM | POA: Diagnosis not present

## 2024-03-04 DIAGNOSIS — H5213 Myopia, bilateral: Secondary | ICD-10-CM | POA: Diagnosis not present

## 2024-03-04 DIAGNOSIS — H524 Presbyopia: Secondary | ICD-10-CM | POA: Diagnosis not present

## 2024-03-04 DIAGNOSIS — H43813 Vitreous degeneration, bilateral: Secondary | ICD-10-CM | POA: Diagnosis not present

## 2024-03-04 LAB — HM DIABETES EYE EXAM

## 2024-04-01 DIAGNOSIS — E782 Mixed hyperlipidemia: Secondary | ICD-10-CM | POA: Diagnosis not present

## 2024-04-01 DIAGNOSIS — I119 Hypertensive heart disease without heart failure: Secondary | ICD-10-CM | POA: Diagnosis not present

## 2024-04-01 DIAGNOSIS — E119 Type 2 diabetes mellitus without complications: Secondary | ICD-10-CM | POA: Diagnosis not present

## 2024-04-29 DIAGNOSIS — I10 Essential (primary) hypertension: Secondary | ICD-10-CM | POA: Diagnosis not present

## 2024-04-29 DIAGNOSIS — E119 Type 2 diabetes mellitus without complications: Secondary | ICD-10-CM | POA: Diagnosis not present

## 2024-04-29 DIAGNOSIS — E782 Mixed hyperlipidemia: Secondary | ICD-10-CM | POA: Diagnosis not present

## 2024-04-29 DIAGNOSIS — I2699 Other pulmonary embolism without acute cor pulmonale: Secondary | ICD-10-CM | POA: Diagnosis not present

## 2024-05-05 DIAGNOSIS — Z86711 Personal history of pulmonary embolism: Secondary | ICD-10-CM | POA: Diagnosis not present

## 2024-05-05 DIAGNOSIS — R0789 Other chest pain: Secondary | ICD-10-CM | POA: Diagnosis not present

## 2024-05-05 DIAGNOSIS — M79A21 Nontraumatic compartment syndrome of right lower extremity: Secondary | ICD-10-CM | POA: Diagnosis not present

## 2024-05-05 DIAGNOSIS — E119 Type 2 diabetes mellitus without complications: Secondary | ICD-10-CM | POA: Diagnosis not present

## 2024-05-05 DIAGNOSIS — J328 Other chronic sinusitis: Secondary | ICD-10-CM | POA: Diagnosis not present

## 2024-05-05 DIAGNOSIS — I119 Hypertensive heart disease without heart failure: Secondary | ICD-10-CM | POA: Diagnosis not present

## 2024-05-05 DIAGNOSIS — M791 Myalgia, unspecified site: Secondary | ICD-10-CM | POA: Diagnosis not present

## 2024-05-05 DIAGNOSIS — N1831 Chronic kidney disease, stage 3a: Secondary | ICD-10-CM | POA: Diagnosis not present

## 2024-05-05 DIAGNOSIS — K8681 Exocrine pancreatic insufficiency: Secondary | ICD-10-CM | POA: Diagnosis not present

## 2024-05-18 DIAGNOSIS — Z86711 Personal history of pulmonary embolism: Secondary | ICD-10-CM | POA: Diagnosis not present

## 2024-05-18 DIAGNOSIS — E119 Type 2 diabetes mellitus without complications: Secondary | ICD-10-CM | POA: Diagnosis not present

## 2024-05-18 DIAGNOSIS — M791 Myalgia, unspecified site: Secondary | ICD-10-CM | POA: Diagnosis not present

## 2024-05-18 DIAGNOSIS — M79A21 Nontraumatic compartment syndrome of right lower extremity: Secondary | ICD-10-CM | POA: Diagnosis not present

## 2024-05-18 DIAGNOSIS — N182 Chronic kidney disease, stage 2 (mild): Secondary | ICD-10-CM | POA: Diagnosis not present

## 2024-05-18 DIAGNOSIS — K8681 Exocrine pancreatic insufficiency: Secondary | ICD-10-CM | POA: Diagnosis not present

## 2024-05-18 DIAGNOSIS — Z7901 Long term (current) use of anticoagulants: Secondary | ICD-10-CM | POA: Diagnosis not present

## 2024-05-18 DIAGNOSIS — J328 Other chronic sinusitis: Secondary | ICD-10-CM | POA: Diagnosis not present

## 2024-05-18 DIAGNOSIS — I119 Hypertensive heart disease without heart failure: Secondary | ICD-10-CM | POA: Diagnosis not present

## 2024-05-20 ENCOUNTER — Encounter: Payer: Self-pay | Admitting: *Deleted

## 2024-05-20 NOTE — Progress Notes (Signed)
 [name]                                          MRN: [id number]   05/20/2024   The VBCI Quality Team Specialist reviewed this patient medical record for the purposes of chart review for care gap closure. The following were reviewed: chart review for care gap closure-kidney health evaluation for diabetes:eGFR  and uACR.    VBCI Quality Team

## 2024-06-04 ENCOUNTER — Encounter: Payer: Self-pay | Admitting: Internal Medicine

## 2024-06-09 ENCOUNTER — Other Ambulatory Visit: Payer: Self-pay | Admitting: Internal Medicine

## 2024-06-09 DIAGNOSIS — G4489 Other headache syndrome: Secondary | ICD-10-CM

## 2024-06-18 DIAGNOSIS — Z7901 Long term (current) use of anticoagulants: Secondary | ICD-10-CM | POA: Diagnosis not present

## 2024-06-18 DIAGNOSIS — J328 Other chronic sinusitis: Secondary | ICD-10-CM | POA: Diagnosis not present

## 2024-06-18 DIAGNOSIS — I119 Hypertensive heart disease without heart failure: Secondary | ICD-10-CM | POA: Diagnosis not present

## 2024-06-18 DIAGNOSIS — Z86711 Personal history of pulmonary embolism: Secondary | ICD-10-CM | POA: Diagnosis not present

## 2024-06-18 DIAGNOSIS — K8681 Exocrine pancreatic insufficiency: Secondary | ICD-10-CM | POA: Diagnosis not present

## 2024-06-18 DIAGNOSIS — G44209 Tension-type headache, unspecified, not intractable: Secondary | ICD-10-CM | POA: Diagnosis not present

## 2024-06-18 DIAGNOSIS — M79A21 Nontraumatic compartment syndrome of right lower extremity: Secondary | ICD-10-CM | POA: Diagnosis not present

## 2024-06-18 DIAGNOSIS — Z23 Encounter for immunization: Secondary | ICD-10-CM | POA: Diagnosis not present

## 2024-06-18 DIAGNOSIS — M791 Myalgia, unspecified site: Secondary | ICD-10-CM | POA: Diagnosis not present

## 2024-06-18 DIAGNOSIS — E119 Type 2 diabetes mellitus without complications: Secondary | ICD-10-CM | POA: Diagnosis not present

## 2024-07-16 ENCOUNTER — Encounter: Payer: Self-pay | Admitting: Internal Medicine

## 2024-07-20 ENCOUNTER — Inpatient Hospital Stay: Admission: RE | Admit: 2024-07-20 | Discharge: 2024-07-20 | Attending: Internal Medicine | Admitting: Internal Medicine

## 2024-07-20 DIAGNOSIS — G4489 Other headache syndrome: Secondary | ICD-10-CM

## 2024-07-20 MED ORDER — GADOPICLENOL 0.5 MMOL/ML IV SOLN
8.0000 mL | Freq: Once | INTRAVENOUS | Status: AC | PRN
  Administered 2024-07-20: 8 mL via INTRAVENOUS
